# Patient Record
Sex: Male | Born: 1935 | Race: Black or African American | Hispanic: No | Marital: Married | Smoking: Former smoker
Health system: Southern US, Community
[De-identification: ages and names within clinical notes are randomized; demographics above are authoritative.]

## PROBLEM LIST (undated history)

## (undated) DIAGNOSIS — I119 Hypertensive heart disease without heart failure: Secondary | ICD-10-CM

## (undated) DIAGNOSIS — E785 Hyperlipidemia, unspecified: Secondary | ICD-10-CM

## (undated) DIAGNOSIS — I259 Chronic ischemic heart disease, unspecified: Secondary | ICD-10-CM

## (undated) DIAGNOSIS — I2510 Atherosclerotic heart disease of native coronary artery without angina pectoris: Secondary | ICD-10-CM

## (undated) DIAGNOSIS — I214 Non-ST elevation (NSTEMI) myocardial infarction: Secondary | ICD-10-CM

## (undated) HISTORY — DX: Non-ST elevation (NSTEMI) myocardial infarction: I21.4

## (undated) HISTORY — DX: Atherosclerotic heart disease of native coronary artery without angina pectoris: I25.10

## (undated) HISTORY — DX: Hypertensive heart disease without heart failure: I11.9

## (undated) HISTORY — DX: Chronic ischemic heart disease, unspecified: I25.9

## (undated) HISTORY — PX: CORONARY STENT PLACEMENT: SHX1402

## (undated) HISTORY — DX: Hyperlipidemia, unspecified: E78.5

---

## 1998-11-16 ENCOUNTER — Emergency Department (HOSPITAL_COMMUNITY): Admission: EM | Admit: 1998-11-16 | Discharge: 1998-11-16 | Payer: Self-pay | Admitting: Emergency Medicine

## 1998-11-22 ENCOUNTER — Emergency Department (HOSPITAL_COMMUNITY): Admission: EM | Admit: 1998-11-22 | Discharge: 1998-11-22 | Payer: Self-pay | Admitting: Emergency Medicine

## 2000-06-29 ENCOUNTER — Emergency Department (HOSPITAL_COMMUNITY): Admission: EM | Admit: 2000-06-29 | Discharge: 2000-06-29 | Payer: Self-pay | Admitting: Emergency Medicine

## 2001-02-10 ENCOUNTER — Emergency Department (HOSPITAL_COMMUNITY): Admission: EM | Admit: 2001-02-10 | Discharge: 2001-02-11 | Payer: Self-pay

## 2006-06-28 DIAGNOSIS — I2510 Atherosclerotic heart disease of native coronary artery without angina pectoris: Secondary | ICD-10-CM

## 2006-06-28 HISTORY — DX: Atherosclerotic heart disease of native coronary artery without angina pectoris: I25.10

## 2006-12-31 ENCOUNTER — Inpatient Hospital Stay (HOSPITAL_COMMUNITY): Admission: EM | Admit: 2006-12-31 | Discharge: 2007-01-03 | Payer: Self-pay | Admitting: Emergency Medicine

## 2007-01-02 HISTORY — PX: CARDIAC CATHETERIZATION: SHX172

## 2010-06-18 ENCOUNTER — Ambulatory Visit: Payer: Self-pay | Admitting: Cardiology

## 2010-11-10 NOTE — Cardiovascular Report (Signed)
[name], [name] NO.:  [id number]   MEDICAL RECORD NO.:  [id number]          PATIENT TYPE:  INP   LOCATION:  3707                         FACILITY:  [facility]   PHYSICIAN:  Vesta Mixer, M.D. DATE OF BIRTH:  [birth year]   DATE OF PROCEDURE:  01/02/2007  DATE OF DISCHARGE:                            CARDIAC CATHETERIZATION   [name] is a 75 year old gentleman with a history of  hypercholesterolemia.  He has not been to the doctor in many years.  He  presented with chest pain and was found to have a subendocardial  myocardial infarction with positive cardiac enzymes.  He is referred for  heart catheterization for further evaluation.   PROCEDURE:  Left heart catheterization with coronary angiography, PTCA,  and stenting of the left circumflex artery.   The right coronary artery was easily cannulated using a modified  Seldinger technique.   HEMODYNAMICS:  LV pressure is 141/10, with an aortic pressure of 141/70.   ANGIOGRAPHY:  Left main.  The left main is fairly large vessel.  There  is a distal 30-40% stenosis right at the bifurcation of the LAD and left  circumflex artery.   The left anterior descending artery is a moderate-to-large vessel.  There are mild to moderate diffuse irregularities throughout the LAD.  There is a 40% proximal stenosis.  There is diffuse 30%-40% stenosis  throughout the course of the LAD.   The LAD gives off a first diagonal artery that is fairly large.  There  is diffuse 40%-50% stenosis, with a focal 60%-70% stenosis.   The left circumflex artery is a very large vessel.  There is a tight 99%  proximal stenosis.  There is TIMI grade III flow through the vessel.   The right coronary artery is large and dominant.  The RCA is severely  and diffusely diseased, between 75% and 80% throughout the proximal and  midsegments.  The distal segments have moderate irregularities.  The  left ventriculogram was performed in the 30 RAO  position.  It reveals  overall well-preserved left ventricular systolic function, with an  ejection fraction of 50%.   PCI:  The 6-French sheath was traded out for a 7-French sheath.  The  patient received Angiomax.  He also received Plavix 600 mg p.o.Marland Kitchen  The  left main was cannulated using a CLS-4 guide.  A BMW wire was placed  down into the distal circumflex artery.  A 3 x 15-mm Maverick was used  to predilate the lesion.  It was placed across the stenosis and was  inflated up to 10 atmospheres for 48 seconds.  Following this, a 3 x 18  mm Cypher was positioned across the stenosis and was deployed 16  atmospheres for 47 seconds.   Poststent dilatation was achieved using a 3.5 by a 15 mm Dura Star.  It  was positioned in the middle.  The stent was inflated up to 16  atmospheres for 30 seconds.  It was then pulled back proximally and was  inflated up to 16 atmospheres for 18 seconds.  It was then repositioned  one more time.  The proximal aspect of the stent was inflated up to 18  atmospheres for seconds.  This final size was approximately 3.6 mm in  diameter.  This gave [place] a nice angiographic result.  There was some wire  bias in the distal aspect of the circumflex artery which resolved with  removal of the wire.   COMPLICATIONS:  None.   CONCLUSIONS:  1. Severe three-vessel coronary artery disease, with an especially      tight stenosis in the left circumflex artery.  2. Successful PTCA and stenting of the left circumflex artery.  The      patient will continue with medical therapy for his LAD and right      coronary artery.  He will need very aggressive cholesterol      lowering.           ______________________________  Vesta Mixer, M.D.     PJN/MEDQ  D:  01/02/2007  T:  01/02/2007  Job:  [id number]   cc:   Cassell Clement, M.D.

## 2010-11-10 NOTE — Discharge Summary (Signed)
[name], HLAVATY NO.:  [id number]   MEDICAL RECORD NO.:  [id number]          PATIENT TYPE:  INP   LOCATION:  6531                         FACILITY:  [facility]   PHYSICIAN:  Cassell Clement, M.D. DATE OF BIRTH:  22-Apr-1936   DATE OF ADMISSION:  12/31/2006  DATE OF DISCHARGE:  01/03/2007                               DISCHARGE SUMMARY   FINAL DIAGNOSES:  1. Acute subendocardial myocardial infarction with positive enzymes.  2. Hypertension.  3. Hyperlipidemia.   HISTORY:  This 75 year old African American male was admitted through  the emergency room on December 31, 2006, with chest pain.  He noted the chest  pain when he got up to void on the evening prior to admission.  He had  had a previous episode of chest discomfort while mowing the grass a week  earlier.  He came to the emergency room, where his blood pressure was  markedly elevated at 190/115.  Examination was unremarkable and the  patient was in minimal chest discomfort at the time of his examination.  His electrocardiogram showed no acute changes.  Chest x-ray showed  borderline cardiomegaly.  Initial labs showed normal renal function,  slightly elevated blood sugar at 127, BUN of 14, with normal  electrolytes, and his initial cardiac enzymes were elevated with a  troponin of 0.15.  Chest x-ray showed mild cardiomegaly but no acute  pulmonary disease.  The patient was placed on IV nitroglycerin and IV  heparin.  He was admitted over the Fourth of July weekend.  On Monday,  July 7, he underwent cardiac catheterization by Dr. Delane Ginger.  Dr.  Elease Hashimoto found that the patient had significant three-vessel coronary  disease with an especially tight stenosis in the left circumflex artery.  This was treated with successful PTCA and stenting of the left  circumflex artery.  The medical therapy would be continued for his LAD  and right coronary artery.  Of note is the fact that the patient's  lipids on admission were  extremely high with a total cholesterol of 304,  LDL of 251, HDL of 32 and triglycerides of 104.   His cardiac enzymes did show significant elevation with a peak troponin  of 1.85 and peak CK-MB of 15.9.  The patient tolerated the  catheterization well and he was able to be discharged improved the  following morning, to be followed closely in the office.  The importance  of compliance with blood pressure and cholesterol medication long-term  was emphasized to the patient.  The patient will be discharged on a low-  sodium, heart-healthy diet.  He is not to do any heavy lifting or  straining.  He is to be seen back in the office in 1 week.   DISCHARGE MEDICATIONS:  1. Generic Toprol XL 50 mg one daily.  2. Altace 5 mg daily.  3. Lipitor 80 mg daily.  4. Norvasc 5 mg daily.  5. Aspirin 325 mg daily.  6. Plavix 75 mg daily.  7. Nitrostat 1/150 sublingually p.r.n.   When the patient returns in 1 week for office visit, we will get  an EKG  and a BMET.   CONDITION ON DISCHARGE:  Improved.           ______________________________  Cassell Clement, M.D.     TB/MEDQ  D:  01/03/2007  T:  01/03/2007  Job:  [id number]   cc:   Mina Marble, M.D.  Vesta Mixer, M.D.

## 2010-11-10 NOTE — H&P (Signed)
[name], [name] NO.:  [id number]   MEDICAL RECORD NO.:  [id number]          PATIENT TYPE:  EMS   LOCATION:  MAJO                         FACILITY:  [facility]   PHYSICIAN:  Cassell Clement, M.D. DATE OF BIRTH:  [birth year]   DATE OF ADMISSION:  12/31/2006  DATE OF DISCHARGE:                              HISTORY & PHYSICAL   CHIEF COMPLAINT:  Chest pain.   HISTORY:  This is a 75 year old African American male admitted with  substernal chest pain with right arm tingling.  He had the onset last  evening when he got up to void.  The discomfort lasted 10 minutes.  There was no nausea, vomiting or diaphoresis.  Presently the patient is  on no medication.  He last saw his primary MD about a year ago and was  told to take a blood pressure pill and Lipitor but when he ran out of  his medicines, he did not bother to get it refilled because he was  feeling well.  He does not take aspirin on a regular basis but does take  B.C.'s frequently.   The family history reveals that his mother is still living at age 20 but  has a heart condition.  Father died in his 59s.   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  He quit smoking 30 years ago,  quit drinking 30 years ago.   He has had no operations except for a laceration of the head.   REVIEW OF SYSTEMS:  Unremarkable.  He does not have any history of  peptic ulcer disease or any history of genitourinary problems.  He  denies any respiratory symptoms.   Remainder of the review of systems is negative in detail.   On physical exam, blood pressure is 190/115, pulse is 80 and regular,  temperature 97.4.  This is a well-developed, well-nourished gentleman in no distress.  HEENT:  No carotid bruits.  Chest is clear.  The heart reveals a quiet precordium without murmur, gallop, rub or  click.  The abdomen is soft without hepatosplenomegaly or masses.  Extremities  show trace ankle edema.  There are 1+ pedal pulses.   His electrocardiogram shows poor R-wave progression in V1 through V3 but  no acute changes.  Chest x-ray shows borderline cardiomegaly, no active  disease.  Initial labs showed normal renal function, slightly elevated  blood sugar at 127, BUN 14, creatinine 1.0, sodium 138, potassium 4.1.  White count 5500, hemoglobin of 14.1.  Troponin I is was 0.15, CK-MB is  3.5.  INR is 0.9, PTT 28.  Chest x-ray shows suboptimal inspiration with  mild cardiomegaly but no acute pulmonary disease.   IMPRESSION:  1. Acute coronary syndrome, rule out myocardial infarction.  I should      mention also that a week ago the patient noted a similar episode of      chest tightness while mowing the grass and it was relieved by rest.  2. Hypertensive cardiovascular disease.  3. Hyperlipidemia.   DISPOSITION:  Admit to telemetry  for treating him with aspirin, statins,  beta-blockers, ACE inhibitors, we will start him on IV nitroglycerin, IV  heparin.  Will get serial enzymes and EKGs.  Anticipate cardiac  catheterization on July 7 by [facility] or sooner p.r.n.           ______________________________  Cassell Clement, M.D.     TB/MEDQ  D:  12/31/2006  T:  12/31/2006  Job:  [id number]   cc:   Mina Marble, M.D.

## 2010-12-24 ENCOUNTER — Other Ambulatory Visit: Payer: Self-pay | Admitting: Cardiology

## 2010-12-24 DIAGNOSIS — E7800 Pure hypercholesterolemia, unspecified: Secondary | ICD-10-CM

## 2010-12-24 NOTE — Telephone Encounter (Signed)
Faxed refill for pravastatin 

## 2011-02-03 ENCOUNTER — Encounter: Payer: Self-pay | Admitting: Cardiology

## 2011-02-10 ENCOUNTER — Other Ambulatory Visit: Payer: Self-pay | Admitting: Cardiology

## 2011-02-10 DIAGNOSIS — I2510 Atherosclerotic heart disease of native coronary artery without angina pectoris: Secondary | ICD-10-CM

## 2011-02-10 DIAGNOSIS — I252 Old myocardial infarction: Secondary | ICD-10-CM

## 2011-02-10 DIAGNOSIS — I119 Hypertensive heart disease without heart failure: Secondary | ICD-10-CM

## 2011-02-10 DIAGNOSIS — I259 Chronic ischemic heart disease, unspecified: Secondary | ICD-10-CM

## 2011-02-11 ENCOUNTER — Other Ambulatory Visit: Payer: Self-pay | Admitting: *Deleted

## 2011-02-12 ENCOUNTER — Other Ambulatory Visit: Payer: Self-pay | Admitting: Cardiology

## 2011-02-12 ENCOUNTER — Other Ambulatory Visit (INDEPENDENT_AMBULATORY_CARE_PROVIDER_SITE_OTHER): Payer: Medicare Other | Admitting: *Deleted

## 2011-02-12 DIAGNOSIS — I252 Old myocardial infarction: Secondary | ICD-10-CM

## 2011-02-12 DIAGNOSIS — I259 Chronic ischemic heart disease, unspecified: Secondary | ICD-10-CM

## 2011-02-12 DIAGNOSIS — I2510 Atherosclerotic heart disease of native coronary artery without angina pectoris: Secondary | ICD-10-CM

## 2011-02-12 DIAGNOSIS — I119 Hypertensive heart disease without heart failure: Secondary | ICD-10-CM

## 2011-02-12 LAB — BASIC METABOLIC PANEL
CO2: 27 mEq/L (ref 19–32)
Calcium: 9.3 mg/dL (ref 8.4–10.5)
Creatinine, Ser: 1.3 mg/dL (ref 0.4–1.5)
GFR: 72.13 mL/min (ref >60.00)

## 2011-02-12 LAB — HEPATIC FUNCTION PANEL
Bilirubin, Direct: 0 mg/dL (ref 0.0–0.3)
Total Protein: 7.4 g/dL (ref 6.0–8.3)

## 2011-02-12 LAB — LIPID PANEL
Cholesterol: 233 mg/dL — ABNORMAL HIGH (ref 0–200)
HDL: 43.8 mg/dL (ref >39.00)
Total CHOL/HDL Ratio: 5
Triglycerides: 183 mg/dL — ABNORMAL HIGH (ref 0.0–149.0)
VLDL: 36.6 mg/dL (ref 0.0–40.0)

## 2011-02-15 ENCOUNTER — Ambulatory Visit (INDEPENDENT_AMBULATORY_CARE_PROVIDER_SITE_OTHER): Payer: Medicare Other | Admitting: Cardiology

## 2011-02-15 ENCOUNTER — Encounter: Payer: Self-pay | Admitting: Cardiology

## 2011-02-15 DIAGNOSIS — E7800 Pure hypercholesterolemia, unspecified: Secondary | ICD-10-CM

## 2011-02-15 DIAGNOSIS — E119 Type 2 diabetes mellitus without complications: Secondary | ICD-10-CM

## 2011-02-15 DIAGNOSIS — E785 Hyperlipidemia, unspecified: Secondary | ICD-10-CM | POA: Insufficient documentation

## 2011-02-15 DIAGNOSIS — I259 Chronic ischemic heart disease, unspecified: Secondary | ICD-10-CM

## 2011-02-15 DIAGNOSIS — I2510 Atherosclerotic heart disease of native coronary artery without angina pectoris: Secondary | ICD-10-CM

## 2011-02-15 DIAGNOSIS — I119 Hypertensive heart disease without heart failure: Secondary | ICD-10-CM

## 2011-02-15 MED ORDER — METFORMIN HCL 500 MG PO TABS: 500.0000 mg | ORAL_TABLET | Freq: Every day | ORAL | Status: DC

## 2011-02-15 NOTE — Assessment & Plan Note (Signed)
The patient has a history of hypercholesterolemia.  He is on  pravastatin 40 mg daily.He is not having any side effects from the statin therapy

## 2011-02-15 NOTE — Assessment & Plan Note (Signed)
Recent blood sugars have been running elevated.Blood sugar today as 130.  Triglycerides run high.  He is gaining weight.  We are going to add metformin 500 mg one daily to his regimen

## 2011-02-15 NOTE — Progress Notes (Signed)
[name]:  August 31, 1935 [facility] [address]   [address] [place], [place]  [address] [phone]           Fax   [phone]  History of Present Illness: This pleasant 75 year old [place] American male is seen for a scheduled followup office visit he has a history of ischemic heart disease.  He had a history of a subendocardial myocardial infarction with positive enzymes in July 2008.  At that time he had stenting of his left circumflex.  He has not had any recurrent chest pain.  He does have a history of hypercholesterolemia and essential hypertension and recently has shown evidence of diabetes.  He has gained weight since last visit and has been eating some sweets.  He has not had any recurrent angina pectoris  Current Outpatient Prescriptions  Medication Sig Dispense Refill  . aspirin 81 MG tablet Take 81 mg by mouth daily.        . clopidogrel (PLAVIX) 75 MG tablet Take 75 mg by mouth daily.        . metoprolol (TOPROL-XL) 50 MG 24 hr tablet Take 50 mg by mouth daily.        . nitroGLYCERIN (NITROSTAT) 0.4 MG SL tablet Place 0.4 mg under the tongue every 5 (five) minutes as needed.        . pravastatin (PRAVACHOL) 40 MG tablet TAKE ONE TABLET BY MOUTH EVERY DAY  30 tablet  3  . lisinopril-hydrochlorothiazide (PRINZIDE,ZESTORETIC) 20-12.5 MG per tablet Take 1 tablet by mouth daily.        . metFORMIN (GLUCOPHAGE) 500 MG tablet Take 1 tablet (500 mg total) by mouth daily with breakfast.  30 tablet  11  . niacin 500 MG tablet Take 500 mg by mouth daily with breakfast.          No Known Allergies  There is no problem list on file for this patient.   History  Smoking status  . Former Smoker  . Quit date: 02/02/1985  Smokeless tobacco  . Not on file    History  Alcohol Use No    No family history on file.  Review of Systems: Constitutional: no fever chills diaphoresis or fatigue or change in weight.  Head and neck: no  hearing loss, no epistaxis, no photophobia or visual disturbance. Respiratory: No cough, shortness of breath or wheezing. Cardiovascular: No chest pain peripheral edema, palpitations. Gastrointestinal: No abdominal distention, no abdominal pain, no change in bowel habits hematochezia or melena. Genitourinary: No dysuria, no frequency, no urgency, no nocturia. Musculoskeletal:No arthralgias, no back pain, no gait disturbance or myalgias. Neurological: No dizziness, no headaches, no numbness, no seizures, no syncope, no weakness, no tremors. Hematologic: No lymphadenopathy, no easy bruising. Psychiatric: No confusion, no hallucinations, no sleep disturbance.    Physical Exam: Filed Vitals:   02/15/11 1412  BP: 130/80  Pulse: 80   The general appearance reveals a well-developed well-nourished gentleman in no distress.Pupils equal and reactive.   Extraocular Movements are full.  There is no scleral icterus.  The mouth and pharynx are normal.  The neck is supple.  The carotids reveal no bruits.  The jugular venous pressure is normal.  The thyroid is not enlarged.  There is no lymphadenopathy.  The chest is clear to percussion and auscultation. There are no rales or rhonchi. Expansion of the chest is symmetrical.  The precordium is quiet.  The first heart sound is normal.  The second  heart sound is physiologically split.  There is no murmur gallop rub or click.  There is no abnormal lift or heave.  The abdomen is soft and nontender. Bowel sounds are normal. The liver and spleen are not enlarged. There Are no abdominal masses. There are no bruits.    Extremities show mild peripheral edema.Strength is normal and symmetrical in all extremities.  There is no lateralizing weakness.  There are no sensory deficits.  The skin is warm and dry.  There is no rash.    Assessment / Plan: Continue same medication.  Recheck in 6 months for followup office visit and lab work.

## 2011-02-15 NOTE — Assessment & Plan Note (Addendum)
No history of any recent angina pectoris.  No palpitations.  No symptoms of CHF.He does have a history of a Cypher drug-eluting stent to the left circumflex in 01/02/07 and he remains on Plavix And a baby aspirin daily.

## 2011-04-13 LAB — DIFFERENTIAL
Basophils Absolute: 0
Eosinophils Relative: 2
Lymphocytes Relative: 28
Monocytes Absolute: 0.5
Monocytes Relative: 9

## 2011-04-13 LAB — CBC
HCT: 39.7
HCT: 43.3
Hemoglobin: 13
Hemoglobin: 14.1
MCHC: 32.7
MCHC: 32.9
MCV: 81.7
MCV: 82.3
Platelets: 309
RBC: 4.77
RBC: 4.82
RBC: 5.29
RDW: 14.4 — ABNORMAL HIGH
WBC: 6.4
WBC: 7.8

## 2011-04-13 LAB — BASIC METABOLIC PANEL
BUN: 10
BUN: 11
Calcium: 9.2
Calcium: 9.4
Chloride: 102
Chloride: 102
Creatinine, Ser: 0.95
Creatinine, Ser: 0.97
Creatinine, Ser: 1
GFR calc Af Amer: >60
GFR calc Af Amer: >60
GFR calc Af Amer: >60
GFR calc non Af Amer: >60
GFR calc non Af Amer: >60
Potassium: 3.9
Sodium: 138

## 2011-04-13 LAB — POCT I-STAT CREATININE: Operator id: 196461

## 2011-04-13 LAB — POCT CARDIAC MARKERS
CKMB, poc: 3.5
CKMB, poc: 6.5
Myoglobin, poc: 122
Myoglobin, poc: 125
Myoglobin, poc: 188
Operator id: 196461
Operator id: 196461
Operator id: 291361
Troponin i, poc: 0.15 — ABNORMAL HIGH
Troponin i, poc: 0.23 — ABNORMAL HIGH

## 2011-04-13 LAB — CARDIAC PANEL(CRET KIN+CKTOT+MB+TROPI)
CK, MB: 15.9 — ABNORMAL HIGH
Relative Index: 5.1 — ABNORMAL HIGH
Total CK: 215
Troponin I: 1.85

## 2011-04-13 LAB — I-STAT 8, (EC8 V) (CONVERTED LAB)
BUN: 14
Bicarbonate: 21.8
Glucose, Bld: 127 — ABNORMAL HIGH
TCO2: 23
pH, Ven: 7.447 — ABNORMAL HIGH

## 2011-04-13 LAB — HEPARIN LEVEL (UNFRACTIONATED)
Heparin Unfractionated: 0.55
Heparin Unfractionated: 0.74 — ABNORMAL HIGH

## 2011-04-13 LAB — PROTIME-INR: Prothrombin Time: 12.6

## 2011-04-13 LAB — HEMOGLOBIN A1C: Hgb A1c MFr Bld: 7 — ABNORMAL HIGH

## 2011-04-13 LAB — D-DIMER, QUANTITATIVE: D-Dimer, Quant: 1.27 — ABNORMAL HIGH

## 2011-04-13 LAB — CK TOTAL AND CKMB (NOT AT ARMC): Total CK: 160

## 2011-04-13 LAB — LIPID PANEL
HDL: 32 — ABNORMAL LOW
Total CHOL/HDL Ratio: 9.5
Triglycerides: 104

## 2011-04-13 LAB — TROPONIN I: Troponin I: 0.38 — ABNORMAL HIGH

## 2011-05-06 ENCOUNTER — Other Ambulatory Visit: Payer: Medicare Other

## 2011-05-08 ENCOUNTER — Other Ambulatory Visit: Payer: Self-pay | Admitting: Cardiology

## 2011-06-02 ENCOUNTER — Ambulatory Visit: Payer: Medicare Other | Admitting: Infectious Diseases

## 2011-08-18 ENCOUNTER — Other Ambulatory Visit: Payer: Medicare Other

## 2011-08-18 ENCOUNTER — Encounter: Payer: Self-pay | Admitting: Cardiology

## 2011-08-18 ENCOUNTER — Ambulatory Visit (INDEPENDENT_AMBULATORY_CARE_PROVIDER_SITE_OTHER): Payer: Medicare Other | Admitting: Cardiology

## 2011-08-18 VITALS — BP 140/98 | HR 64 | Ht 70.0 in | Wt 226.0 lb

## 2011-08-18 DIAGNOSIS — E7800 Pure hypercholesterolemia, unspecified: Secondary | ICD-10-CM

## 2011-08-18 DIAGNOSIS — I259 Chronic ischemic heart disease, unspecified: Secondary | ICD-10-CM

## 2011-08-18 DIAGNOSIS — E119 Type 2 diabetes mellitus without complications: Secondary | ICD-10-CM

## 2011-08-18 DIAGNOSIS — I2510 Atherosclerotic heart disease of native coronary artery without angina pectoris: Secondary | ICD-10-CM

## 2011-08-18 DIAGNOSIS — I119 Hypertensive heart disease without heart failure: Secondary | ICD-10-CM

## 2011-08-18 LAB — BASIC METABOLIC PANEL
Calcium: 9.3 mg/dL (ref 8.4–10.5)
GFR: 92.97 mL/min (ref >60.00)
Glucose, Bld: 110 mg/dL — ABNORMAL HIGH (ref 70–99)
Potassium: 3.9 mEq/L (ref 3.5–5.1)
Sodium: 139 mEq/L (ref 135–145)

## 2011-08-18 LAB — LIPID PANEL
Cholesterol: 242 mg/dL — ABNORMAL HIGH (ref 0–200)
HDL: 43.9 mg/dL (ref >39.00)
Triglycerides: 111 mg/dL (ref 0.0–149.0)

## 2011-08-18 LAB — HEPATIC FUNCTION PANEL
ALT: 12 U/L (ref 0–53)
AST: 12 U/L (ref 0–37)
Albumin: 3.9 g/dL (ref 3.5–5.2)
Total Protein: 7.4 g/dL (ref 6.0–8.3)

## 2011-08-18 LAB — HEMOGLOBIN A1C: Hgb A1c MFr Bld: 7.1 % — ABNORMAL HIGH (ref 4.6–6.5)

## 2011-08-18 MED ORDER — NITROGLYCERIN 0.4 MG SL SUBL: 0.4000 mg | SUBLINGUAL_TABLET | SUBLINGUAL | Status: DC | PRN

## 2011-08-18 MED ORDER — LISINOPRIL-HYDROCHLOROTHIAZIDE 20-12.5 MG PO TABS: 1.0000 | ORAL_TABLET | Freq: Every day | ORAL | Status: DC

## 2011-08-18 NOTE — Patient Instructions (Addendum)
Will obtain labs today and call you with the results (lp/bmet/hfp/a1c)  Restart Lisinopril Hct 20/12.5mg  daily, Rx sent to [facility] for this and NTG  Your physician wants you to follow-up in: 6 months You will receive a reminder letter in the mail two months in advance. If you don't receive a letter, please call our office to schedule the follow-up appointment.

## 2011-08-18 NOTE — Assessment & Plan Note (Signed)
The patient has not been expressing any chest pain or angina. 

## 2011-08-18 NOTE — Assessment & Plan Note (Signed)
The patient is on pravastatin 40 mg daily for his elevated cholesterol.  He has not been having any myalgias.

## 2011-08-18 NOTE — Assessment & Plan Note (Signed)
The patient is on metformin for his diabetes.  He is not having any hypoglycemic episodes.  We are checking a hemoglobin A1c today.

## 2011-08-18 NOTE — Progress Notes (Signed)
[name] Date of Birth:  19-Jul-1935 [facility] [address] [place], [place]  [address] [phone]         Fax   [phone]  History of Present Illness: This pleasant 76 year old African American gentleman is seen for a scheduled 6 month followup office visit.  As a history of known ischemic heart disease.  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  Since then he has not had any recurrent chest pain.  Does carry sublingual nitroglycerin if he were to have pain.  Patient has a history of hypercholesterolemia high blood pressure and diabetes.  His last visit he has lost 5 pounds he is trileaflet suite he has run out of his lisinopril HCT.  Current Outpatient Prescriptions  Medication Sig Dispense Refill  . aspirin 81 MG tablet Take 81 mg by mouth daily.        . clopidogrel (PLAVIX) 75 MG tablet Take 75 mg by mouth daily.        . metFORMIN (GLUCOPHAGE) 500 MG tablet Take 1 tablet (500 mg total) by mouth daily with breakfast.  30 tablet  11  . metoprolol (TOPROL-XL) 50 MG 24 hr tablet Take 50 mg by mouth daily.        . nitroGLYCERIN (NITROSTAT) 0.4 MG SL tablet Place 1 tablet (0.4 mg total) under the tongue every 5 (five) minutes as needed.  25 tablet  prn  . pravastatin (PRAVACHOL) 40 MG tablet TAKE ONE TABLET BY MOUTH EVERY DAY  30 tablet  6  . lisinopril-hydrochlorothiazide (PRINZIDE,ZESTORETIC) 20-12.5 MG per tablet Take 1 tablet by mouth daily.  30 tablet  11    No Known Allergies  Patient Active Problem List  Diagnoses  . Ischemic heart disease  . Hypercholesterolemia  . Diabetes mellitus    History  Smoking status  . Former Smoker  . Quit date: 02/02/1985  Smokeless tobacco  . Not on file    History  Alcohol Use No    No family history on file.  Review of Systems: Constitutional: no fever chills diaphoresis or fatigue  or change in weight.  Head and neck: no hearing loss, no epistaxis, no photophobia or visual disturbance. Respiratory: No cough, shortness of breath or wheezing. Cardiovascular: No chest pain peripheral edema, palpitations. Gastrointestinal: No abdominal distention, no abdominal pain, no change in bowel habits hematochezia or melena. Genitourinary: No dysuria, no frequency, no urgency, no nocturia. Musculoskeletal:No arthralgias, no back pain, no gait disturbance or myalgias. Neurological: No dizziness, no headaches, no numbness, no seizures, no syncope, no weakness, no tremors. Hematologic: No lymphadenopathy, no easy bruising. Psychiatric: No confusion, no hallucinations, no sleep disturbance.    Physical Exam: Filed Vitals:   08/18/11 1031  BP: 140/98  Pulse: 64   the general appearance reveals a well-developed African American gentleman in no distress.Pupils equal and reactive.   Extraocular Movements are full.  There is no scleral icterus.  The mouth and pharynx are normal.  The neck is supple.  The carotids reveal no bruits.  The jugular venous pressure is normal.  The thyroid is not enlarged.  There is no lymphadenopathy.  The chest is clear to percussion and auscultation. There are no rales or rhonchi. Expansion of the chest is symmetrical.  The precordium is quiet.  The first heart sound is normal.  The second heart sound is physiologically split.  There is no  murmur gallop rub or click.  There is no abnormal lift or heave.  The abdomen is soft and nontender. Bowel sounds are normal. The liver and spleen are not enlarged. There Are no abdominal masses. There are no bruits.  Extremities reveal no phlebitis or edema.  Pedal pulses are good.Strength is normal and symmetrical in all extremities.  There is no lateralizing weakness.  There are no sensory deficits.     Assessment / Plan: The patient has run out of his lisinopril HCT which would account for his higher blood pressure  today.  We will restart lisinopril HCT 20/12.5 one daily.  We will also refill his Nitrostat 0.4 mg sublingually when necessary which he has run out of also.  Recheck in 6 months for followup office visit EKG lipid panel hepatic function panel nasal metabolic panel and hemoglobin A5W.  Continue diabetic diet.

## 2011-08-23 ENCOUNTER — Telehealth: Payer: Self-pay | Admitting: *Deleted

## 2011-08-23 NOTE — Telephone Encounter (Signed)
Mailed copy of labs and left message to call if any questions  

## 2011-08-23 NOTE — Telephone Encounter (Signed)
Message copied by Burnell Blanks on Mon Aug 23, 2011  1:04 PM ------      Message from: Cassell Clement      Created: Wed Aug 18, 2011  9:05 PM       Please report.  Labs okay except cholesterol still too high and diabetes needs better control.  Lose more weight, careful diet.  Continue same meds.

## 2011-09-02 ENCOUNTER — Other Ambulatory Visit: Payer: Self-pay | Admitting: Cardiology

## 2011-09-02 NOTE — Telephone Encounter (Signed)
Refilled metoprolol 

## 2011-09-11 ENCOUNTER — Other Ambulatory Visit: Payer: Self-pay | Admitting: Cardiology

## 2011-09-13 NOTE — Telephone Encounter (Signed)
Refilled plavix 

## 2011-12-15 ENCOUNTER — Other Ambulatory Visit: Payer: Self-pay | Admitting: Cardiology

## 2012-02-27 ENCOUNTER — Other Ambulatory Visit: Payer: Self-pay | Admitting: Cardiology

## 2012-02-29 ENCOUNTER — Other Ambulatory Visit: Payer: Self-pay | Admitting: *Deleted

## 2012-02-29 MED ORDER — METFORMIN HCL 500 MG PO TABS: 500.0000 mg | ORAL_TABLET | Freq: Every day | ORAL | Status: DC

## 2012-02-29 NOTE — Telephone Encounter (Signed)
Patient has appointment in November

## 2012-03-14 ENCOUNTER — Other Ambulatory Visit: Payer: Self-pay | Admitting: Cardiology

## 2012-05-03 ENCOUNTER — Encounter: Payer: Self-pay | Admitting: Cardiology

## 2012-05-03 ENCOUNTER — Ambulatory Visit (INDEPENDENT_AMBULATORY_CARE_PROVIDER_SITE_OTHER): Payer: Medicare Other | Admitting: Cardiology

## 2012-05-03 ENCOUNTER — Telehealth: Payer: Self-pay | Admitting: *Deleted

## 2012-05-03 VITALS — BP 142/74 | HR 56 | Ht 70.0 in | Wt 212.0 lb

## 2012-05-03 DIAGNOSIS — I119 Hypertensive heart disease without heart failure: Secondary | ICD-10-CM

## 2012-05-03 DIAGNOSIS — E7800 Pure hypercholesterolemia, unspecified: Secondary | ICD-10-CM

## 2012-05-03 DIAGNOSIS — I259 Chronic ischemic heart disease, unspecified: Secondary | ICD-10-CM

## 2012-05-03 DIAGNOSIS — E119 Type 2 diabetes mellitus without complications: Secondary | ICD-10-CM

## 2012-05-03 LAB — BASIC METABOLIC PANEL
Chloride: 103 mEq/L (ref 96–112)
GFR: 85.89 mL/min (ref >60.00)
Glucose, Bld: 113 mg/dL — ABNORMAL HIGH (ref 70–99)
Potassium: 3.7 mEq/L (ref 3.5–5.1)
Sodium: 139 mEq/L (ref 135–145)

## 2012-05-03 LAB — HEPATIC FUNCTION PANEL
ALT: 9 U/L (ref 0–53)
AST: 11 U/L (ref 0–37)
Alkaline Phosphatase: 47 U/L (ref 39–117)
Total Bilirubin: 0.9 mg/dL (ref 0.3–1.2)

## 2012-05-03 LAB — LIPID PANEL
Total CHOL/HDL Ratio: 7
VLDL: 24.8 mg/dL (ref 0.0–40.0)

## 2012-05-03 NOTE — Progress Notes (Signed)
[name] Date of Birth:  [birth year] [facility] [address] [place], [place]  [address] [phone]         Fax   [phone]  History of Present Illness: This pleasant 76 year old African American gentleman is seen for a scheduled 6 month followup office visit.  The patient has a history of known ischemic heart disease. 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 Since then he has not had any recurrent chest pain. Does carry sublingual nitroglycerin if he were to have pain. Patient has a history of hypercholesterolemia high blood pressure and diabetes.  The patient is on a careful diet and has lost 14 pounds since last visit intentionally.   Current Outpatient Prescriptions  Medication Sig Dispense Refill  . aspirin 81 MG tablet Take 81 mg by mouth daily.        Marland Kitchen lisinopril-hydrochlorothiazide (PRINZIDE,ZESTORETIC) 20-12.5 MG per tablet Take 1 tablet by mouth daily.  30 tablet  11  . metFORMIN (GLUCOPHAGE) 500 MG tablet Take 1 tablet (500 mg total) by mouth daily with breakfast.  30 tablet  3  . metoprolol succinate (TOPROL-XL) 50 MG 24 hr tablet TAKE ONE TABLET BY MOUTH EVERY DAY  30 tablet  11  . PLAVIX 75 MG tablet TAKE ONE TABLET BY MOUTH EVERY DAY  30 each  11  . pravastatin (PRAVACHOL) 40 MG tablet TAKE ONE TABLET BY MOUTH EVERY DAY  30 tablet  9  . nitroGLYCERIN (NITROSTAT) 0.4 MG SL tablet Place 1 tablet (0.4 mg total) under the tongue every 5 (five) minutes as needed.  25 tablet  prn    No Known Allergies  Patient Active Problem List  Diagnosis  . Ischemic heart disease  . Hypercholesterolemia  . Diabetes mellitus    History  Smoking status  . Former Smoker  . Quit date: 02/02/1985  Smokeless tobacco  . Not on file    History  Alcohol Use No    No family history on file.  Review of Systems: Constitutional: no fever  chills diaphoresis or fatigue or change in weight.  Head and neck: no hearing loss, no epistaxis, no photophobia or visual disturbance. Respiratory: No cough, shortness of breath or wheezing. Cardiovascular: No chest pain peripheral edema, palpitations. Gastrointestinal: No abdominal distention, no abdominal pain, no change in bowel habits hematochezia or melena. Genitourinary: No dysuria, no frequency, no urgency, no nocturia. Musculoskeletal:No arthralgias, no back pain, no gait disturbance or myalgias. Neurological: No dizziness, no headaches, no numbness, no seizures, no syncope, no weakness, no tremors. Hematologic: No lymphadenopathy, no easy bruising. Psychiatric: No confusion, no hallucinations, no sleep disturbance.    Physical Exam: Filed Vitals:   05/03/12 0942  BP: 142/74  Pulse: 56   the general appearance reveals a well-developed well-nourished gentleman in no distress.The head and neck exam reveals pupils equal and reactive.  Extraocular movements are full.  There is no scleral icterus.  The mouth and pharynx are normal.  The neck is supple.  The carotids reveal no bruits.  The jugular venous pressure is normal.  The  thyroid is not enlarged.  There is no lymphadenopathy.  The chest is clear to percussion and auscultation.  There are no rales or rhonchi.  Expansion of the chest is symmetrical.  The precordium is quiet.  The first heart sound is normal.  The second heart sound is physiologically split.  There  is no murmur gallop rub or click.  There is no abnormal lift or heave.  The abdomen is soft and nontender.  The bowel sounds are normal.  The liver and spleen are not enlarged.  There are no abdominal masses.  There are no abdominal bruits.  Extremities reveal good pedal pulses.  There is no phlebitis or edema.  There is no cyanosis or clubbing.  Strength is normal and symmetrical in all extremities.  There is no lateralizing weakness.  There are no sensory deficits.  The skin is  warm and dry.  There is no rash.  EKG today shows his bradycardia and a pattern of an old anteroseptal myocardial infarction and no ischemic changes.   Assessment / Plan: The patient is to continue same medications.  I rechecked in 6 months for followup office visit lipid panel hepatic function panel basal metabolic panel and A1c.  Continue heart healthy diet.  He is looking forward to celebrating his 50th wedding anniversary next summer.

## 2012-05-03 NOTE — Patient Instructions (Addendum)
Your physician recommends that you continue on your current medications as directed. Please refer to the Current Medication list given to you today.  Your physician wants you to follow-up in: 6 months with fasting labs (lp/bmet/hfp/a1c) You will receive a reminder letter in the mail two months in advance. If you don't receive a letter, please call our office to schedule the follow-up appointment.   Will obtain labs today and call you with the results (lp/bmet/hfp/a1c)

## 2012-05-03 NOTE — Assessment & Plan Note (Signed)
The patient is tolerating pravastatin.  He is not having any myalgias.  Blood work is pending today

## 2012-05-03 NOTE — Assessment & Plan Note (Signed)
The patient denies any recurrent chest pain or angina.  He continues to carry sublingual nitroglycerin with him.  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

## 2012-05-03 NOTE — Progress Notes (Signed)
Quick Note:  Please report to patient. The recent labs are stable. Continue same medication and careful diet. ______ 

## 2012-05-03 NOTE — Assessment & Plan Note (Signed)
The patient has not been experiencing any hypoglycemic episodes. 

## 2012-05-03 NOTE — Telephone Encounter (Signed)
Advised to continue same dose of medications and work harder on diet

## 2012-05-03 NOTE — Telephone Encounter (Signed)
Message copied by Burnell Blanks on Wed May 03, 2012  4:51 PM ------      Message from: Cassell Clement      Created: Wed May 03, 2012  4:26 PM       Please report to patient.  The recent labs are stable. Continue same medication and careful diet.

## 2012-07-10 ENCOUNTER — Telehealth: Payer: Self-pay | Admitting: Cardiology

## 2012-07-10 NOTE — Telephone Encounter (Signed)
Pt needs refill

## 2012-08-28 ENCOUNTER — Other Ambulatory Visit: Payer: Self-pay | Admitting: *Deleted

## 2012-08-28 MED ORDER — LISINOPRIL-HYDROCHLOROTHIAZIDE 20-12.5 MG PO TABS: 1.0000 | ORAL_TABLET | Freq: Every day | ORAL | Status: DC

## 2012-09-27 ENCOUNTER — Telehealth: Payer: Self-pay | Admitting: Cardiology

## 2012-09-27 MED ORDER — METOPROLOL SUCCINATE ER 50 MG PO TB24: 50.0000 mg | ORAL_TABLET | Freq: Every day | ORAL | Status: DC

## 2012-09-27 MED ORDER — CLOPIDOGREL BISULFATE 75 MG PO TABS: 75.0000 mg | ORAL_TABLET | Freq: Every day | ORAL | Status: DC

## 2012-09-27 NOTE — Telephone Encounter (Signed)
Refilled as requested, advised patient

## 2012-09-27 NOTE — Telephone Encounter (Signed)
New problem   Pt stated he can't get medication from pharmacy until they hear from office for Plavix and Metorpolol please call [facility]

## 2012-10-13 ENCOUNTER — Other Ambulatory Visit: Payer: Self-pay | Admitting: *Deleted

## 2012-10-13 MED ORDER — METFORMIN HCL 500 MG PO TABS: 500.0000 mg | ORAL_TABLET | Freq: Every day | ORAL | Status: DC

## 2012-11-01 ENCOUNTER — Ambulatory Visit (INDEPENDENT_AMBULATORY_CARE_PROVIDER_SITE_OTHER): Payer: Medicare Other | Admitting: Cardiology

## 2012-11-01 ENCOUNTER — Encounter: Payer: Self-pay | Admitting: Cardiology

## 2012-11-01 VITALS — BP 144/70 | HR 66 | Ht <= 58 in | Wt 211.4 lb

## 2012-11-01 DIAGNOSIS — I259 Chronic ischemic heart disease, unspecified: Secondary | ICD-10-CM

## 2012-11-01 DIAGNOSIS — E7800 Pure hypercholesterolemia, unspecified: Secondary | ICD-10-CM

## 2012-11-01 LAB — LIPID PANEL
Cholesterol: 254 mg/dL — ABNORMAL HIGH (ref 0–200)
Total CHOL/HDL Ratio: 7
Triglycerides: 257 mg/dL — ABNORMAL HIGH (ref 0.0–149.0)
VLDL: 51.4 mg/dL — ABNORMAL HIGH (ref 0.0–40.0)

## 2012-11-01 LAB — BASIC METABOLIC PANEL
CO2: 26 mEq/L (ref 19–32)
Calcium: 9.6 mg/dL (ref 8.4–10.5)
Creatinine, Ser: 1.1 mg/dL (ref 0.4–1.5)
Sodium: 139 mEq/L (ref 135–145)

## 2012-11-01 LAB — HEPATIC FUNCTION PANEL
AST: 8 U/L (ref 0–37)
Albumin: 3.9 g/dL (ref 3.5–5.2)
Total Protein: 7.5 g/dL (ref 6.0–8.3)

## 2012-11-01 LAB — HEMOGLOBIN A1C: Hgb A1c MFr Bld: 6.9 % — ABNORMAL HIGH (ref 4.6–6.5)

## 2012-11-01 NOTE — Assessment & Plan Note (Signed)
The patient is on metformin for his diabetes.  He denies hypoglycemic episodes.

## 2012-11-01 NOTE — Patient Instructions (Addendum)
Will obtain labs today and call you with the results (lp/bmet/hfp/a1c)  Your physician recommends that you continue on your current medications as directed. Please refer to the Current Medication list given to you today.  Your physician wants you to follow-up in 6 months with fasting labs (lp/bmet/hfp/a1c) and ekg  You will receive a reminder letter in the mail two months in advance. If you don't receive a letter, please call our office to schedule the follow-up appointment.

## 2012-11-01 NOTE — Progress Notes (Signed)
[name] Date of Birth:  07-06-1935 [facility] [address] [place], [place]  [address] [phone]         Fax   [phone]  History of Present Illness: This [name] is seen for a scheduled 6 month followup office visit. The patient has a history of known ischemic heart disease. 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 Since then he has not had any recurrent chest pain. Does carry sublingual nitroglycerin if he were to have pain. Patient has a history of hypercholesterolemia high blood pressure and diabetes. The patient is on a careful diet and has been losing weight intentionally.   Current Outpatient Prescriptions  Medication Sig Dispense Refill  . aspirin 81 MG tablet Take 81 mg by mouth daily.        . clopidogrel (PLAVIX) 75 MG tablet Take 1 tablet (75 mg total) by mouth daily.  30 tablet  5  . lisinopril-hydrochlorothiazide (PRINZIDE,ZESTORETIC) 20-12.5 MG per tablet Take 1 tablet by mouth daily.  30 tablet  3  . metFORMIN (GLUCOPHAGE) 500 MG tablet Take 1 tablet (500 mg total) by mouth daily with breakfast.  30 tablet  1  . metoprolol succinate (TOPROL-XL) 50 MG 24 hr tablet Take 1 tablet (50 mg total) by mouth daily. Take with or immediately following a meal.  30 tablet  5  . nitroGLYCERIN (NITROSTAT) 0.4 MG SL tablet Place 1 tablet (0.4 mg total) under the tongue every 5 (five) minutes as needed.  25 tablet  prn  . pravastatin (PRAVACHOL) 40 MG tablet TAKE ONE TABLET BY MOUTH EVERY DAY  30 tablet  9   No current facility-administered medications for this visit.    No Known Allergies  Patient Active Problem List   Diagnosis Date Noted  . Ischemic heart disease 02/15/2011  . Hypercholesterolemia 02/15/2011  . Diabetes mellitus 02/15/2011    History  Smoking status  . Former Smoker  .  Quit date: 02/02/1985  Smokeless tobacco  . Not on file    History  Alcohol Use No    No family history on file.  Review of Systems: Constitutional: no fever chills diaphoresis or fatigue or change in weight.  Head and neck: no hearing loss, no epistaxis, no photophobia or visual disturbance. Respiratory: No cough, shortness of breath or wheezing. Cardiovascular: No chest pain peripheral edema, palpitations. Gastrointestinal: No abdominal distention, no abdominal pain, no change in bowel habits hematochezia or melena. Genitourinary: No dysuria, no frequency, no urgency, no nocturia. Musculoskeletal:No arthralgias, no back pain, no gait disturbance or myalgias. Neurological: No dizziness, no headaches, no numbness, no seizures, no syncope, no weakness, no tremors. Hematologic: No lymphadenopathy, no easy bruising. Psychiatric: No confusion, no hallucinations, no sleep disturbance.    Physical Exam: Filed Vitals:   11/01/12 1409  BP: 144/70  Pulse: 66   the general appearance well-developed well-nourished African American gentleman in no distress.The head and neck exam reveals pupils equal and reactive.  Extraocular movements are full.  There is no scleral icterus.  The mouth and pharynx are normal.  The neck is supple.  The carotids reveal no bruits.  The jugular venous pressure is normal.  The  thyroid is not enlarged.  There is no lymphadenopathy.  The chest is clear to percussion and auscultation.  There are no rales or rhonchi.  Expansion of the chest is symmetrical.  The precordium is quiet.  The first heart sound is normal.  The second heart sound is physiologically split.  There is no murmur gallop rub or click.  There is no abnormal lift or heave.  The abdomen is soft and nontender.  The bowel sounds are normal.  The liver and spleen are not enlarged.  There are no abdominal masses.  There are no abdominal bruits.  Extremities reveal good pedal pulses.  There is no phlebitis or  edema.  There is no cyanosis or clubbing.  Strength is normal and symmetrical in all extremities.  There is no lateralizing weakness.  There are no sensory deficits.  The skin is warm and dry.  There is no rash.     Assessment / Plan: Continue same medication.  Recheck in 6 months for followup office visit EKG lipid panel hepatic function panel basal metabolic panel and A1c.  Continue present diet.

## 2012-11-01 NOTE — Assessment & Plan Note (Signed)
The patient has a history of hypercholesterolemia.  He is on pravastatin.  He is not having any myalgias or side effects.  Blood work today is pending.

## 2012-11-01 NOTE — Assessment & Plan Note (Signed)
The patient has not been having any recurrent chest pain or angina.  He has not had to take any recent sublingual nitroglycerin.  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  He enjoys working.

## 2012-11-02 LAB — LDL CHOLESTEROL, DIRECT: Direct LDL: 182.5 mg/dL

## 2012-11-06 ENCOUNTER — Other Ambulatory Visit: Payer: Self-pay | Admitting: *Deleted

## 2012-11-06 DIAGNOSIS — E785 Hyperlipidemia, unspecified: Secondary | ICD-10-CM

## 2012-11-06 MED ORDER — ATORVASTATIN CALCIUM 40 MG PO TABS: 40.0000 mg | ORAL_TABLET | Freq: Every day | ORAL | Status: DC

## 2012-12-14 ENCOUNTER — Other Ambulatory Visit: Payer: Self-pay | Admitting: *Deleted

## 2012-12-14 ENCOUNTER — Telehealth: Payer: Self-pay | Admitting: *Deleted

## 2012-12-14 DIAGNOSIS — E785 Hyperlipidemia, unspecified: Secondary | ICD-10-CM

## 2012-12-14 MED ORDER — METFORMIN HCL 500 MG PO TABS: 500.0000 mg | ORAL_TABLET | Freq: Every day | ORAL | Status: DC

## 2012-12-14 MED ORDER — ATORVASTATIN CALCIUM 40 MG PO TABS: 40.0000 mg | ORAL_TABLET | Freq: Every day | ORAL | Status: DC

## 2012-12-14 NOTE — Telephone Encounter (Signed)
Called pt that refills for atorvastatin & metformin were sent in as requested. Mylo Red RN

## 2012-12-27 ENCOUNTER — Other Ambulatory Visit: Payer: Self-pay | Admitting: *Deleted

## 2012-12-27 DIAGNOSIS — I119 Hypertensive heart disease without heart failure: Secondary | ICD-10-CM

## 2012-12-27 MED ORDER — LISINOPRIL-HYDROCHLOROTHIAZIDE 20-12.5 MG PO TABS: 1.0000 | ORAL_TABLET | Freq: Every day | ORAL | Status: DC

## 2013-04-04 ENCOUNTER — Other Ambulatory Visit: Payer: Self-pay

## 2013-04-04 MED ORDER — METOPROLOL SUCCINATE ER 50 MG PO TB24: 50.0000 mg | ORAL_TABLET | Freq: Every day | ORAL | Status: DC

## 2013-04-04 MED ORDER — CLOPIDOGREL BISULFATE 75 MG PO TABS: 75.0000 mg | ORAL_TABLET | Freq: Every day | ORAL | Status: DC

## 2013-04-30 ENCOUNTER — Ambulatory Visit (INDEPENDENT_AMBULATORY_CARE_PROVIDER_SITE_OTHER): Payer: Medicare Other | Admitting: Cardiology

## 2013-04-30 ENCOUNTER — Encounter (INDEPENDENT_AMBULATORY_CARE_PROVIDER_SITE_OTHER): Payer: Self-pay

## 2013-04-30 ENCOUNTER — Encounter: Payer: Self-pay | Admitting: Cardiology

## 2013-04-30 VITALS — BP 148/76 | HR 51 | Ht 71.0 in | Wt 204.4 lb

## 2013-04-30 DIAGNOSIS — E7800 Pure hypercholesterolemia, unspecified: Secondary | ICD-10-CM

## 2013-04-30 DIAGNOSIS — I119 Hypertensive heart disease without heart failure: Secondary | ICD-10-CM | POA: Insufficient documentation

## 2013-04-30 DIAGNOSIS — I259 Chronic ischemic heart disease, unspecified: Secondary | ICD-10-CM

## 2013-04-30 LAB — HEMOGLOBIN A1C: Hgb A1c MFr Bld: 6.7 % — ABNORMAL HIGH (ref 4.6–6.5)

## 2013-04-30 LAB — HEPATIC FUNCTION PANEL
ALT: 14 U/L (ref 0–53)
AST: 15 U/L (ref 0–37)
Albumin: 3.8 g/dL (ref 3.5–5.2)
Total Bilirubin: 0.5 mg/dL (ref 0.3–1.2)
Total Protein: 7.5 g/dL (ref 6.0–8.3)

## 2013-04-30 LAB — LIPID PANEL
HDL: 43.8 mg/dL (ref >39.00)
Triglycerides: 85 mg/dL (ref 0.0–149.0)
VLDL: 17 mg/dL (ref 0.0–40.0)

## 2013-04-30 LAB — BASIC METABOLIC PANEL
BUN: 14 mg/dL (ref 6–23)
CO2: 32 mEq/L (ref 19–32)
GFR: 94.7 mL/min (ref >60.00)
Potassium: 3.4 mEq/L — ABNORMAL LOW (ref 3.5–5.1)

## 2013-04-30 NOTE — Assessment & Plan Note (Signed)
Since last visit the patient has had just one episode of substernal chest discomfort.  He described it as a burning.  It occurred at rest while he was watching television.  He used a single sublingual nitroglycerin with relief.  He has not had any exertional chest pain or any subsequent rest pain.  We'll continue current meds

## 2013-04-30 NOTE — Progress Notes (Signed)
[name] Date of Birth:  [birth year] [address] [place], [place]  [address] [phone]         Fax   [phone]  History of Present Illness: This [name] is seen for a scheduled 6 month followup office visit.  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 The patient has a history of known ischemic heart disease. 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 Since then he has not had any recurrent chest pain. Does carry sublingual nitroglycerin if he were to have pain. Patient has a history of hypercholesterolemia high blood pressure and diabetes. The patient is on a careful diet and has been losing weight intentionally.  He intends to start going to the [facility] to do regular exercise on machines.   Current Outpatient Prescriptions  Medication Sig Dispense Refill  . atorvastatin (LIPITOR) 40 MG tablet Take 1 tablet (40 mg total) by mouth daily.  30 tablet  6  . clopidogrel (PLAVIX) 75 MG tablet Take 1 tablet (75 mg total) by mouth daily.  30 tablet  5  . lisinopril-hydrochlorothiazide (PRINZIDE,ZESTORETIC) 20-12.5 MG per tablet Take 1 tablet by mouth daily.  30 tablet  3  . metFORMIN (GLUCOPHAGE) 500 MG tablet Take 1 tablet (500 mg total) by mouth daily with breakfast.  30 tablet  6  . metoprolol succinate (TOPROL-XL) 50 MG 24 hr tablet Take 1 tablet (50 mg total) by mouth daily. Take with or immediately following a meal.  30 tablet  5  . nitroGLYCERIN (NITROSTAT) 0.4 MG SL tablet Place 1 tablet (0.4 mg total) under the tongue every 5 (five) minutes as needed.  25 tablet  prn   No current facility-administered medications for this visit.    No Known Allergies  Patient Active Problem List   Diagnosis Date Noted  . Benign hypertensive heart disease without heart failure 04/30/2013  . Ischemic  heart disease 02/15/2011  . Hypercholesterolemia 02/15/2011  . Diabetes mellitus 02/15/2011    History  Smoking status  . Former Smoker  . Quit date: 02/02/1985  Smokeless tobacco  . Not on file    History  Alcohol Use No    History reviewed. No pertinent family history.  Review of Systems: Constitutional: no fever chills diaphoresis or fatigue or change in weight.  Head and neck: no hearing loss, no epistaxis, no photophobia or visual disturbance. Respiratory: No cough, shortness of breath or wheezing. Cardiovascular: No chest pain peripheral edema, palpitations. Gastrointestinal: No abdominal distention, no abdominal pain, no change in bowel habits hematochezia or melena. Genitourinary: No dysuria, no frequency, no urgency, no nocturia. Musculoskeletal:No arthralgias, no back pain, no gait disturbance or myalgias. Neurological: No dizziness, no headaches, no numbness, no seizures, no syncope, no weakness, no tremors. Hematologic: No lymphadenopathy, no easy bruising. Psychiatric: No confusion, no hallucinations, no sleep disturbance.    Physical Exam: Filed Vitals:   04/30/13 1059  BP: 148/76  Pulse: 51   the general appearance well-developed well-nourished African American gentleman in no distress.The head and neck exam reveals pupils equal and reactive.  Extraocular movements are full.  There is no scleral icterus.  The mouth and pharynx are normal.  The neck is supple.  The carotids reveal no bruits.  The jugular venous pressure is normal.  The  thyroid is not enlarged.  There is no lymphadenopathy.  The chest is clear to percussion and auscultation.  There are no rales or rhonchi.  Expansion of the chest is symmetrical.  The precordium is quiet.  The first heart sound is normal.  The second heart sound is physiologically split.  There is no murmur gallop rub or click.  There is no abnormal lift or heave.  The abdomen is soft and nontender.  The bowel sounds are normal.   The liver and spleen are not enlarged.  There are no abdominal masses.  There are no abdominal bruits.  Extremities reveal good pedal pulses.  There is no phlebitis or edema.  There is no cyanosis or clubbing.  Strength is normal and symmetrical in all extremities.  There is no lateralizing weakness.  There are no sensory deficits.  The skin is warm and dry.  There is no rash.  EKG shows sinus bradycardia at 51 per minute.  No ischemic changes.  First degree AV block with PR interval of 216 ms  Assessment / Plan: Continue same medication.  Recheck in 6 months for followup office visit EKG lipid panel hepatic function panel basal metabolic panel. Continue present diet.

## 2013-04-30 NOTE — Assessment & Plan Note (Signed)
Blood pressures remaining stable on current medication.  His weight is down 7 pounds since I last saw him intentionally

## 2013-04-30 NOTE — Assessment & Plan Note (Signed)
The patient is on Lipitor for his hypercholesterolemia.  He is not having any myalgias or other side effects.  Blood work today is pending

## 2013-04-30 NOTE — Patient Instructions (Signed)
Will obtain labs today and call you with the results (lp/bmet/hfpa1c)  Your physician recommends that you continue on your current medications as directed. Please refer to the Current Medication list given to you today.  Your physician wants you to follow-up in: 6 months with fasting labs (lp/bmet/hfp)  You will receive a reminder letter in the mail two months in advance. If you don't receive a letter, please call our office to schedule the follow-up appointment.  

## 2013-04-30 NOTE — Assessment & Plan Note (Signed)
Patient has not been having any hypoglycemic episodes. 

## 2013-05-01 NOTE — Progress Notes (Signed)
Quick Note:  Please report to patient. The recent labs are stable. Continue same medication and careful diet. Cholesterol is better. Her potassium is slightly low so eat bananas and high potassium foods ______

## 2013-05-07 ENCOUNTER — Other Ambulatory Visit: Payer: Self-pay | Admitting: Cardiology

## 2013-07-12 ENCOUNTER — Other Ambulatory Visit: Payer: Self-pay | Admitting: Cardiology

## 2013-08-04 ENCOUNTER — Other Ambulatory Visit: Payer: Self-pay | Admitting: Cardiology

## 2013-08-06 ENCOUNTER — Other Ambulatory Visit: Payer: Self-pay | Admitting: *Deleted

## 2013-08-06 MED ORDER — METFORMIN HCL 500 MG PO TABS: 500.0000 mg | ORAL_TABLET | Freq: Every day | ORAL | Status: DC

## 2013-10-15 ENCOUNTER — Other Ambulatory Visit: Payer: Self-pay | Admitting: Cardiology

## 2013-10-31 ENCOUNTER — Ambulatory Visit (INDEPENDENT_AMBULATORY_CARE_PROVIDER_SITE_OTHER): Payer: Medicare Other | Admitting: Cardiology

## 2013-10-31 ENCOUNTER — Encounter: Payer: Self-pay | Admitting: Cardiology

## 2013-10-31 VITALS — BP 140/70 | HR 60 | Ht 71.0 in | Wt 212.0 lb

## 2013-10-31 DIAGNOSIS — E7800 Pure hypercholesterolemia, unspecified: Secondary | ICD-10-CM

## 2013-10-31 DIAGNOSIS — I119 Hypertensive heart disease without heart failure: Secondary | ICD-10-CM

## 2013-10-31 DIAGNOSIS — IMO0001 Reserved for inherently not codable concepts without codable children: Secondary | ICD-10-CM

## 2013-10-31 DIAGNOSIS — I259 Chronic ischemic heart disease, unspecified: Secondary | ICD-10-CM

## 2013-10-31 DIAGNOSIS — E1165 Type 2 diabetes mellitus with hyperglycemia: Secondary | ICD-10-CM

## 2013-10-31 LAB — HEPATIC FUNCTION PANEL
ALK PHOS: 63 U/L (ref 39–117)
ALT: 11 U/L (ref 0–53)
AST: 12 U/L (ref 0–37)
Albumin: 3.8 g/dL (ref 3.5–5.2)
Bilirubin, Direct: 0.1 mg/dL (ref 0.0–0.3)
Total Bilirubin: 0.6 mg/dL (ref 0.2–1.2)
Total Protein: 7.5 g/dL (ref 6.0–8.3)

## 2013-10-31 LAB — LIPID PANEL
CHOL/HDL RATIO: 6
Cholesterol: 216 mg/dL — ABNORMAL HIGH (ref 0–200)
HDL: 37.6 mg/dL — ABNORMAL LOW (ref >39.00)
LDL CALC: 154 mg/dL — AB (ref 0–99)
TRIGLYCERIDES: 122 mg/dL (ref 0.0–149.0)
VLDL: 24.4 mg/dL (ref 0.0–40.0)

## 2013-10-31 LAB — BASIC METABOLIC PANEL
BUN: 20 mg/dL (ref 6–23)
CHLORIDE: 102 meq/L (ref 96–112)
CO2: 28 meq/L (ref 19–32)
Calcium: 9.8 mg/dL (ref 8.4–10.5)
Creatinine, Ser: 1.2 mg/dL (ref 0.4–1.5)
GFR: 77.23 mL/min (ref >60.00)
Glucose, Bld: 124 mg/dL — ABNORMAL HIGH (ref 70–99)
Potassium: 3.4 mEq/L — ABNORMAL LOW (ref 3.5–5.1)
Sodium: 138 mEq/L (ref 135–145)

## 2013-10-31 NOTE — Assessment & Plan Note (Signed)
The patient has not been having any hypoglycemic episodes 

## 2013-10-31 NOTE — Progress Notes (Signed)
[name] Date of Birth:  [birth year] [facility] [address] [place], [place]  [id number] [phone]        Fax   [phone]   History of Present Illness:  This pleasant 78 year old African American gentleman is seen for a scheduled 6 month followup office visit. 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 The patient has a history of known ischemic heart disease. 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 Since then he has not had any recurrent chest pain. Does carry sublingual nitroglycerin if he were to have pain. Patient has a history of hypercholesterolemia high blood pressure and diabetes.  The patient has not been getting as much regular exercise over the winter.  His weight is up 8 pounds.  Current Outpatient Prescriptions  Medication Sig Dispense Refill  . atorvastatin (LIPITOR) 40 MG tablet Take 1 tablet (40 mg total) by mouth daily.  30 tablet  6  . clopidogrel (PLAVIX) 75 MG tablet TAKE ONE TABLET BY MOUTH ONCE DAILY  30 tablet  0  . lisinopril-hydrochlorothiazide (PRINZIDE,ZESTORETIC) 20-12.5 MG per tablet TAKE ONE TABLET BY MOUTH ONCE DAILY  30 tablet  5  . metFORMIN (GLUCOPHAGE) 500 MG tablet Take 1 tablet (500 mg total) by mouth daily with breakfast.  30 tablet  4  . metoprolol succinate (TOPROL-XL) 50 MG 24 hr tablet TAKE ONE TABLET BY MOUTH ONCE DAILY. TAKE  WITH  OR  IMMEDIATELY  FOLLOWING  A  MEAL.  30 tablet  0  . nitroGLYCERIN (NITROSTAT) 0.4 MG SL tablet Place 1 tablet (0.4 mg total) under the tongue every 5 (five) minutes as needed.  25 tablet  prn   No current facility-administered medications for this visit.    No Known Allergies  Patient Active Problem List   Diagnosis Date Noted  . Benign hypertensive heart disease without heart failure 04/30/2013  . Ischemic heart disease 02/15/2011  .  Hypercholesterolemia 02/15/2011  . Diabetes mellitus 02/15/2011    History  Smoking status  . Former Smoker  . Quit date: 02/02/1985  Smokeless tobacco  . Not on file    History  Alcohol Use No    History reviewed. No pertinent family history.  Review of Systems: Constitutional: no fever chills diaphoresis or fatigue or change in weight.  Head and neck: no hearing loss, no epistaxis, no photophobia or visual disturbance. Respiratory: No cough, shortness of breath or wheezing. Cardiovascular: No chest pain peripheral edema, palpitations. Gastrointestinal: No abdominal distention, no abdominal pain, no change in bowel habits hematochezia or melena. Genitourinary: No dysuria, no frequency, no urgency, no nocturia. Musculoskeletal:No arthralgias, no back pain, no gait disturbance or myalgias. Neurological: No dizziness, no headaches, no numbness, no seizures, no syncope, no weakness, no tremors. Hematologic: No lymphadenopathy, no easy bruising. Psychiatric: No confusion, no hallucinations, no sleep disturbance.    Physical Exam: Filed Vitals:   10/31/13 0928  BP: 140/70  Pulse: 60   the general appearance reveals a elderly gentleman in no acute distress.The head and neck exam reveals pupils equal and reactive.  Extraocular movements are full.  There is no scleral icterus.  The mouth and pharynx are normal.  The neck is supple.  The carotids reveal no bruits.  The jugular venous pressure is normal.  The  thyroid is not enlarged.  There is no lymphadenopathy.  The  chest is clear to percussion and auscultation.  There are no rales or rhonchi.  Expansion of the chest is symmetrical.  The precordium is quiet.  The first heart sound is normal.  The second heart sound is physiologically split.  There is no murmur gallop rub or click.  There is no abnormal lift or heave.  The abdomen is soft and nontender.  The bowel sounds are normal.  The liver and spleen are not enlarged.  There are no  abdominal masses.  There are no abdominal bruits.  Extremities reveal good pedal pulses.  There is no phlebitis or edema.  There is no cyanosis or clubbing.  Strength is normal and symmetrical in all extremities.  There is no lateralizing weakness.  There are no sensory deficits.  The skin is warm and dry.  There is no rash.     Assessment / Plan: 1. ischemic heart disease status post stenting of left circumflex with a drug-eluting stent in July 2008.  No recurrent angina. 2. benign hypertensive heart disease without heart failure. 3. Hypercholesterolemia. 4.  Diabetes mellitus  Disposition continue on current medication.  Work harder on diet and weight loss.  We are checking laboratory studies today.  Recheck in 6 months for office visit EKG lipid panel basal metabolic panel hepatic function panel and A1c.

## 2013-10-31 NOTE — Assessment & Plan Note (Signed)
The patient is on Lipitor generic 40 mg daily.  No myalgias.  Lab work is pending.

## 2013-10-31 NOTE — Assessment & Plan Note (Signed)
The patient has not had any symptoms of congestive heart failure. 

## 2013-10-31 NOTE — Assessment & Plan Note (Signed)
The patient has not had any recurrent chest pain or angina. 

## 2013-10-31 NOTE — Patient Instructions (Signed)
Will obtain labs today and call you with the results (lp/bmet/hfp)  Work harder on diet and weight loss  Your physician recommends that you continue on your current medications as directed. Please refer to the Current Medication list given to you today.  Your physician wants you to follow-up in: 6 months with fasting labs (lp/bmet/hfp/a1c)  You will receive a reminder letter in the mail two months in advance. If you don't receive a letter, please call our office to schedule the follow-up appointment,

## 2013-11-02 ENCOUNTER — Telehealth: Payer: Self-pay | Admitting: *Deleted

## 2013-11-02 NOTE — Telephone Encounter (Signed)
Advised patient of lab results  

## 2013-11-02 NOTE — Telephone Encounter (Signed)
Message copied by Burnell BlanksPRATT, Sarvesh Meddaugh B on Fri Nov 02, 2013  3:47 PM ------      Message from: Cassell ClementBRACKBILL, THOMAS      Created: Thu Nov 01, 2013  9:08 PM       LDL too high.  Watch diet better. K is low. Increase high K foods. ------

## 2013-11-16 ENCOUNTER — Other Ambulatory Visit: Payer: Self-pay | Admitting: Cardiology

## 2013-12-20 ENCOUNTER — Other Ambulatory Visit: Payer: Self-pay | Admitting: Cardiology

## 2014-03-13 ENCOUNTER — Other Ambulatory Visit: Payer: Self-pay | Admitting: *Deleted

## 2014-03-13 MED ORDER — ATORVASTATIN CALCIUM 40 MG PO TABS: ORAL_TABLET | ORAL | Status: DC

## 2014-03-13 MED ORDER — LISINOPRIL-HYDROCHLOROTHIAZIDE 20-12.5 MG PO TABS: ORAL_TABLET | ORAL | Status: DC

## 2014-03-13 NOTE — Telephone Encounter (Signed)
Ok to refill 

## 2014-03-14 MED ORDER — METOPROLOL SUCCINATE ER 50 MG PO TB24: ORAL_TABLET | ORAL | Status: DC

## 2014-03-25 ENCOUNTER — Other Ambulatory Visit: Payer: Self-pay | Admitting: Cardiology

## 2014-05-29 ENCOUNTER — Other Ambulatory Visit: Payer: Self-pay | Admitting: Cardiology

## 2014-06-28 ENCOUNTER — Other Ambulatory Visit: Payer: Self-pay | Admitting: Cardiology

## 2014-07-02 ENCOUNTER — Other Ambulatory Visit: Payer: Self-pay

## 2014-07-02 MED ORDER — LISINOPRIL-HYDROCHLOROTHIAZIDE 20-12.5 MG PO TABS: ORAL_TABLET | ORAL | Status: DC

## 2014-07-05 ENCOUNTER — Ambulatory Visit (INDEPENDENT_AMBULATORY_CARE_PROVIDER_SITE_OTHER): Payer: Medicare Other | Admitting: Cardiology

## 2014-07-05 ENCOUNTER — Encounter: Payer: Self-pay | Admitting: Cardiology

## 2014-07-05 VITALS — BP 146/88 | HR 54 | Wt 210.0 lb

## 2014-07-05 DIAGNOSIS — IMO0002 Reserved for concepts with insufficient information to code with codable children: Secondary | ICD-10-CM

## 2014-07-05 DIAGNOSIS — I119 Hypertensive heart disease without heart failure: Secondary | ICD-10-CM

## 2014-07-05 DIAGNOSIS — E7800 Pure hypercholesterolemia, unspecified: Secondary | ICD-10-CM

## 2014-07-05 DIAGNOSIS — E1165 Type 2 diabetes mellitus with hyperglycemia: Secondary | ICD-10-CM

## 2014-07-05 DIAGNOSIS — I259 Chronic ischemic heart disease, unspecified: Secondary | ICD-10-CM

## 2014-07-05 LAB — BASIC METABOLIC PANEL
BUN: 19 mg/dL (ref 6–23)
CO2: 29 meq/L (ref 19–32)
Calcium: 9.6 mg/dL (ref 8.4–10.5)
Chloride: 103 mEq/L (ref 96–112)
Creatinine, Ser: 1.1 mg/dL (ref 0.4–1.5)
GFR: 82.73 mL/min (ref >60.00)
GLUCOSE: 102 mg/dL — AB (ref 70–99)
Potassium: 3.6 mEq/L (ref 3.5–5.1)
SODIUM: 140 meq/L (ref 135–145)

## 2014-07-05 LAB — HEPATIC FUNCTION PANEL
ALBUMIN: 3.9 g/dL (ref 3.5–5.2)
ALT: 8 U/L (ref 0–53)
AST: 11 U/L (ref 0–37)
Alkaline Phosphatase: 74 U/L (ref 39–117)
BILIRUBIN DIRECT: 0.1 mg/dL (ref 0.0–0.3)
TOTAL PROTEIN: 7.6 g/dL (ref 6.0–8.3)
Total Bilirubin: 0.8 mg/dL (ref 0.2–1.2)

## 2014-07-05 LAB — LIPID PANEL
Cholesterol: 237 mg/dL — ABNORMAL HIGH (ref 0–200)
HDL: 29.4 mg/dL — AB (ref >39.00)
LDL Cholesterol: 177 mg/dL — ABNORMAL HIGH (ref 0–99)
NONHDL: 207.6
TRIGLYCERIDES: 152 mg/dL — AB (ref 0.0–149.0)
Total CHOL/HDL Ratio: 8
VLDL: 30.4 mg/dL (ref 0.0–40.0)

## 2014-07-05 LAB — HEMOGLOBIN A1C: HEMOGLOBIN A1C: 6.9 % — AB (ref 4.6–6.5)

## 2014-07-05 NOTE — Progress Notes (Signed)
Allen ForemanJohn L Arista Date of Birth:  10-23-1935 [facility] [address] [place], [place]  [id number] [phone]        Fax   [phone]   History of Present Illness:  This pleasant 79 year old African American gentleman is seen for a scheduled 6 month followup office visit. 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  He previously worked for [place]. The patient has a history of known ischemic heart disease. 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 Since then he has not had any recurrent chest pain. Does carry sublingual nitroglycerin if he were to have pain. Patient has a history of hypercholesterolemia high blood pressure and diabetes.   The patient has not been expressing any chest pain or shortness of breath.  He has not been having any hypoglycemia.  He is on metformin once a day.  His weight has been stable. He has a history of hypercholesterolemia.  He is on Lipitor 40 mg daily.  He is not having a myalgias.  Current Outpatient Prescriptions  Medication Sig Dispense Refill  . atorvastatin (LIPITOR) 40 MG tablet TAKE ONE TABLET BY MOUTH ONCE DAILY 90 tablet 0  . clopidogrel (PLAVIX) 75 MG tablet TAKE ONE TABLET BY MOUTH ONCE DAILY 30 tablet 0  . lisinopril-hydrochlorothiazide (PRINZIDE,ZESTORETIC) 20-12.5 MG per tablet TAKE ONE TABLET BY MOUTH ONCE DAILY 90 tablet 0  . metFORMIN (GLUCOPHAGE) 500 MG tablet Take by mouth daily.    . metoprolol succinate (TOPROL-XL) 50 MG 24 hr tablet TAKE ONE TABLET BY MOUTH ONCE DAILY. TAKE  WITH  OR  IMMEDIATELY  FOLLOWING  A  MEAL. 90 tablet 5  . nitroGLYCERIN (NITROSTAT) 0.4 MG SL tablet Place 1 tablet (0.4 mg total) under the tongue every 5 (five) minutes as needed. 25 tablet prn   No current facility-administered medications for this visit.    No Known Allergies  Patient  Active Problem List   Diagnosis Date Noted  . Benign hypertensive heart disease without heart failure 04/30/2013  . Ischemic heart disease 02/15/2011  . Hypercholesterolemia 02/15/2011  . Type II or unspecified type diabetes mellitus without mention of complication, uncontrolled 02/15/2011    History  Smoking status  . Former Smoker  . Quit date: 02/02/1985  Smokeless tobacco  . Not on file    History  Alcohol Use No    History reviewed. No pertinent family history.  Review of Systems: Constitutional: no fever chills diaphoresis or fatigue or change in weight.  Head and neck: no hearing loss, no epistaxis, no photophobia or visual disturbance. Respiratory: No cough, shortness of breath or wheezing. Cardiovascular: No chest pain peripheral edema, palpitations. Gastrointestinal: No abdominal distention, no abdominal pain, no change in bowel habits hematochezia or melena. Genitourinary: No dysuria, no frequency, no urgency, no nocturia. Musculoskeletal:No arthralgias, no back pain, no gait disturbance or myalgias. Neurological: No dizziness, no headaches, no numbness, no seizures, no syncope, no weakness, no tremors. Hematologic: No lymphadenopathy, no easy bruising. Psychiatric: No confusion, no hallucinations, no sleep disturbance.    Physical Exam: Filed Vitals:   07/05/14 0848  BP: 146/88  Pulse: 54   the general appearance reveals a elderly gentleman in no acute distress.The head and neck exam reveals pupils equal and reactive.  Extraocular movements are full.  There is no scleral icterus.  The mouth and pharynx  are normal.  The neck is supple.  The carotids reveal no bruits.  The jugular venous pressure is normal.  The  thyroid is not enlarged.  There is no lymphadenopathy.  The chest is clear to percussion and auscultation.  There are no rales or rhonchi.  Expansion of the chest is symmetrical.  The precordium is quiet.  The first heart sound is normal.  The second heart  sound is physiologically split.  There is no murmur gallop rub or click.  There is no abnormal lift or heave.  The abdomen is soft and nontender.  The bowel sounds are normal.  The liver and spleen are not enlarged.  There are no abdominal masses.  There are no abdominal bruits.  Extremities reveal good pedal pulses.  There is no phlebitis or edema.  There is no cyanosis or clubbing.  Strength is normal and symmetrical in all extremities.  There is no lateralizing weakness.  There are no sensory deficits.  The skin is warm and dry.  There is no rash.  EKG today shows sinus bradycardia and no ischemic changes.   Assessment / Plan: 1. ischemic heart disease status post stenting of left circumflex with a drug-eluting stent in July 2008.  No recurrent angina. 2. benign hypertensive heart disease without heart failure.  Blood pressure is remaining normal. 3. Hypercholesterolemia.  Lab work today pending. 4.  Diabetes mellitus.  Stable.  A1c pending.  Disposition continue on current medication.  Continue on careful diet and weight loss.  We are checking laboratory studies today.  Recheck in 6 months for office visit  lipid panel basal metabolic panel hepatic function panel and A1c.

## 2014-07-05 NOTE — Patient Instructions (Signed)
Will obtain labs today and call you with the results (LP.BMET.HFP.A1C)  Your physician recommends that you continue on your current medications as directed. Please refer to the Current Medication list given to you today.  Your physician wants you to follow-up in: 6 months with fasting labs (lp/bmet/hfp/A1C You will receive a reminder letter in the mail two months in advance. If you don't receive a letter, please call our office to schedule the follow-up appointment.  

## 2014-07-09 ENCOUNTER — Telehealth: Payer: Self-pay | Admitting: *Deleted

## 2014-07-09 MED ORDER — ATORVASTATIN CALCIUM 80 MG PO TABS: ORAL_TABLET | ORAL | Status: DC

## 2014-07-09 NOTE — Progress Notes (Signed)
Increase lipitor to 80 mg daily   

## 2014-07-09 NOTE — Telephone Encounter (Signed)
Patient advised of increase in Lipitor   Notes Recorded by Burnell BlanksMelinda B Collene Massimino on 07/09/2014 at 10:55 AM Patient states he is taking his Lipitor 40 mg every day. Will forward to Dr. Patty SermonsBrackbill for review Notes Recorded by Cassell Clementhomas Brackbill, MD on 07/07/2014 at 4:10 PM Please report. The lipids are not as good. Has he been taking his lipitor 40 mg every day?? Watch diet. CSD. A1C 6.9 slightly higher.

## 2014-07-09 NOTE — Telephone Encounter (Signed)
-----   Message from Cassell Clementhomas Brackbill, MD sent at 07/09/2014 11:31 AM EST ----- Have him increase lipitor to 80 mg daily

## 2014-08-03 ENCOUNTER — Other Ambulatory Visit: Payer: Self-pay | Admitting: Cardiology

## 2014-08-09 ENCOUNTER — Other Ambulatory Visit: Payer: Self-pay

## 2014-08-09 MED ORDER — ATORVASTATIN CALCIUM 80 MG PO TABS: ORAL_TABLET | ORAL | Status: DC

## 2014-10-02 ENCOUNTER — Other Ambulatory Visit: Payer: Self-pay | Admitting: Cardiology

## 2014-10-21 ENCOUNTER — Other Ambulatory Visit: Payer: Self-pay

## 2014-10-21 MED ORDER — NITROGLYCERIN 0.4 MG SL SUBL: 0.4000 mg | SUBLINGUAL_TABLET | SUBLINGUAL | Status: DC | PRN

## 2014-12-30 ENCOUNTER — Other Ambulatory Visit: Payer: Self-pay | Admitting: Cardiology

## 2015-01-30 ENCOUNTER — Other Ambulatory Visit: Payer: Self-pay | Admitting: Cardiology

## 2015-04-02 ENCOUNTER — Other Ambulatory Visit: Payer: Self-pay | Admitting: Cardiology

## 2015-04-08 ENCOUNTER — Other Ambulatory Visit: Payer: Self-pay | Admitting: Cardiology

## 2015-05-08 ENCOUNTER — Ambulatory Visit: Payer: Medicare Other | Admitting: Cardiology

## 2015-05-08 DIAGNOSIS — R0989 Other specified symptoms and signs involving the circulatory and respiratory systems: Secondary | ICD-10-CM

## 2015-05-16 ENCOUNTER — Encounter: Payer: Self-pay | Admitting: Cardiology

## 2015-05-31 ENCOUNTER — Other Ambulatory Visit: Payer: Self-pay | Admitting: Cardiology

## 2015-06-06 ENCOUNTER — Other Ambulatory Visit: Payer: Self-pay | Admitting: Cardiology

## 2015-07-12 ENCOUNTER — Other Ambulatory Visit: Payer: Self-pay | Admitting: Cardiology

## 2015-07-15 ENCOUNTER — Other Ambulatory Visit: Payer: Self-pay | Admitting: Cardiology

## 2015-07-16 NOTE — Telephone Encounter (Signed)
Medication Detail      Disp Refills Start End     lisinopril-hydrochlorothiazide (PRINZIDE,ZESTORETIC) 20-12.5 MG tablet 90 tablet 0 07/14/2015     Sig: TAKE ONE TABLET BY MOUTH ONCE DAILY    E-Prescribing Status: Receipt confirmed by pharmacy (07/14/2015 8:39 AM EST)     Pharmacy    [id number] - [place] ([place]), [place] - [address]

## 2015-07-17 ENCOUNTER — Ambulatory Visit (INDEPENDENT_AMBULATORY_CARE_PROVIDER_SITE_OTHER): Payer: Medicare Other | Admitting: Cardiology

## 2015-07-17 ENCOUNTER — Encounter: Payer: Self-pay | Admitting: Cardiology

## 2015-07-17 VITALS — BP 160/90 | HR 55 | Ht 71.0 in | Wt 213.1 lb

## 2015-07-17 DIAGNOSIS — I119 Hypertensive heart disease without heart failure: Secondary | ICD-10-CM | POA: Diagnosis not present

## 2015-07-17 DIAGNOSIS — I259 Chronic ischemic heart disease, unspecified: Secondary | ICD-10-CM

## 2015-07-17 DIAGNOSIS — E1165 Type 2 diabetes mellitus with hyperglycemia: Secondary | ICD-10-CM

## 2015-07-17 DIAGNOSIS — IMO0001 Reserved for inherently not codable concepts without codable children: Secondary | ICD-10-CM

## 2015-07-17 LAB — BASIC METABOLIC PANEL
BUN: 16 mg/dL (ref 7–25)
CALCIUM: 10 mg/dL (ref 8.6–10.3)
CO2: 29 mmol/L (ref 20–31)
Chloride: 100 mmol/L (ref 98–110)
Creat: 0.92 mg/dL (ref 0.70–1.18)
GLUCOSE: 112 mg/dL — AB (ref 65–99)
Potassium: 3.6 mmol/L (ref 3.5–5.3)
SODIUM: 136 mmol/L (ref 135–146)

## 2015-07-17 LAB — HEPATIC FUNCTION PANEL
ALBUMIN: 4 g/dL (ref 3.6–5.1)
ALT: 16 U/L (ref 9–46)
AST: 14 U/L (ref 10–35)
Alkaline Phosphatase: 61 U/L (ref 40–115)
BILIRUBIN DIRECT: 0.1 mg/dL (ref <=0.2)
Indirect Bilirubin: 0.6 mg/dL (ref 0.2–1.2)
Total Bilirubin: 0.7 mg/dL (ref 0.2–1.2)
Total Protein: 7.4 g/dL (ref 6.1–8.1)

## 2015-07-17 LAB — LIPID PANEL
CHOL/HDL RATIO: 6.1 ratio — AB (ref <=5.0)
CHOLESTEROL: 225 mg/dL — AB (ref 125–200)
HDL: 37 mg/dL — AB (ref >=40)
LDL Cholesterol: 159 mg/dL — ABNORMAL HIGH (ref <130)
TRIGLYCERIDES: 144 mg/dL (ref <150)
VLDL: 29 mg/dL (ref <30)

## 2015-07-17 NOTE — Progress Notes (Signed)
Cardiology Office Note   Date:  07/17/2015   ID:  [name], DOB [birth year], MRN [id number]  PCP:  No PCP Per Patient  Cardiologist: Cassell Clement MD  Chief Complaint  Patient presents with  . routine follow up    Patient denies any chest pain, shortness of breath, le edema, claudication       History of Present Illness: [name] is a 80 y.o. male who presents for one-year follow-up office visit  .  The patient has a history of known ischemic heart disease. 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 Since then he has not had any recurrent chest pain. Does carry sublingual nitroglycerin if he were to have pain. Patient has a history of hypercholesterolemia high blood pressure and diabetes.  The patient has not been expressing any chest pain or shortness of breath. He has not been having any hypoglycemia. He is on metformin once a day. His weight has been stable. He has a history of hypercholesterolemia. He is on Lipitor 40 mg daily. He is not having a myalgias. He gets his exercise by doing yard work around his house. He has not been having any dizzy spells or syncope.  No palpitations.  Past Medical History  Diagnosis Date  . IHD (ischemic heart disease)   . Acute MI, subendocardial (HCC)   . Coronary artery disease   . Hyperlipidemia   . HCD (hypertensive cardiovascular disease)     Past Surgical History  Procedure Laterality Date  . Cardiac catheterization  01/02/2007    EF 50%  . Coronary stent placement      LEFT CIRCUMFLEX     Current Outpatient Prescriptions  Medication Sig Dispense Refill  . atorvastatin (LIPITOR) 80 MG tablet TAKE ONE TABLET BY MOUTH ONCE DAILY 90 tablet 2  . clopidogrel (PLAVIX) 75 MG tablet TAKE ONE TABLET BY MOUTH ONCE DAILY 30 tablet 6  . lisinopril-hydrochlorothiazide (PRINZIDE,ZESTORETIC) 20-12.5 MG  tablet TAKE ONE TABLET BY MOUTH ONCE DAILY 90 tablet 0  . metFORMIN (GLUCOPHAGE) 500 MG tablet TAKE ONE TABLET BY MOUTH ONCE DAILY WITH  BREAKFAST 30 tablet 1  . metoprolol succinate (TOPROL-XL) 50 MG 24 hr tablet TAKE ONE TABLET BY MOUTH ONCE DAILY (TAKE  WITH  OR  IMMEDIATELY  FOLLOWING  A  MEAL) 90 tablet 3  . nitroGLYCERIN (NITROSTAT) 0.4 MG SL tablet Place 0.4 mg under the tongue every 5 (five) minutes as needed for chest pain.     No current facility-administered medications for this visit.    Allergies:   Review of patient's allergies indicates no known allergies.    Social History:  The patient  reports that he quit smoking about 30 years ago. He does not have any smokeless tobacco history on file. He reports that he does not drink alcohol or use illicit drugs.   Family History:  The patient's family history includes Healthy in his father and mother.    ROS:  Please see the history of present illness.   Otherwise, review of systems are positive for none.   All other systems are reviewed and negative.    PHYSICAL EXAM: VS:  BP 160/90 mmHg  Pulse 55  Ht  (1.803 m)  Wt 213 lb 1.9 oz (96.671 kg)  BMI 29.74 kg/m2 , BMI Body mass index is 29.74 kg/(m^2). GEN: Well nourished, well developed, in no acute distress  HEENT: normal Neck: no JVD, carotid bruits, or masses Cardiac: RRR; no murmurs, rubs, or gallops,no edema  Respiratory:  clear to auscultation bilaterally, normal work of breathing GI: soft, nontender, nondistended, + BS MS: no deformity or atrophy Skin: warm and dry, no rash Neuro:  Strength and sensation are intact Psych: euthymic mood, full affect   EKG:  EKG is ordered today. The ekg ordered today demonstrates sinus bradycardia at 55 bpm.  Since last tracing of 07/05/14, no significant change   Recent Labs: No results found for requested labs within last 365 days.    Lipid Panel    Component Value Date/Time   CHOL 237* 07/05/2014 0932   TRIG 152.0*  07/05/2014 0932   HDL 29.40* 07/05/2014 0932   CHOLHDL 8 07/05/2014 0932   VLDL 30.4 07/05/2014 0932   LDLCALC 177* 07/05/2014 0932   LDLDIRECT 182.5 11/01/2012 1434      Wt Readings from Last 3 Encounters:  07/17/15 213 lb 1.9 oz (96.671 kg)  07/05/14 210 lb (95.255 kg)  10/31/13 212 lb (96.163 kg)        ASSESSMENT AND PLAN:  1. ischemic heart disease status post stenting of left circumflex with a drug-eluting stent in July 2008. No recurrent angina. 2. benign hypertensive heart disease without heart failure. Blood pressure his elevated today but he did not take any of his blood pressure pills today.. 3. Hypercholesterolemia. Lab work today pending. 4. Diabetes mellitus. Stable. A1c pending.   Current medicines are reviewed at length with the patient today.  The patient does not have concerns regarding medicines.  The following changes have been made:  no change  Labs/ tests ordered today include:  No orders of the defined types were placed in this encounter.    Disposition: Continue same medication.  Recheck in one year for follow-up office visit and EKG with Dr.Nishan   Signed, Cassell Clement MD 07/17/2015 2:10 PM    [facility] [address] [place], [place], [place]  [address] Phone: [phone]; Fax: [phone]

## 2015-07-17 NOTE — Patient Instructions (Addendum)
Medication Instructions:  Your physician recommends that you continue on your current medications as directed. Please refer to the Current Medication list given to you today.  Labwork: Lp/bmet/hfp  Testing/Procedures: none  Follow-Up: Your physician wants you to follow-up in: 1 year ov with Dr Haywood Filler will receive a reminder letter in the mail two months in advance. If you don't receive a letter, please call our office to schedule the follow-up appointment.  If you need a refill on your cardiac medications before your next appointment, please call your pharmacy.

## 2015-07-18 LAB — HEMOGLOBIN A1C
HEMOGLOBIN A1C: 6.8 % — AB (ref <5.7)
Mean Plasma Glucose: 148 mg/dL — ABNORMAL HIGH (ref <117)

## 2015-07-18 NOTE — Progress Notes (Signed)
Quick Note:  Please report to patient. The recent labs are stable. Continue same medication and careful diet. BS still too high. Watch diet and lose more weight. ______

## 2015-08-08 ENCOUNTER — Other Ambulatory Visit: Payer: Self-pay | Admitting: Cardiology

## 2015-09-10 ENCOUNTER — Other Ambulatory Visit: Payer: Self-pay | Admitting: Cardiology

## 2015-09-12 ENCOUNTER — Other Ambulatory Visit: Payer: Self-pay

## 2015-09-12 ENCOUNTER — Other Ambulatory Visit: Payer: Self-pay | Admitting: *Deleted

## 2015-09-12 MED ORDER — METOPROLOL SUCCINATE ER 50 MG PO TB24: 50.0000 mg | ORAL_TABLET | Freq: Every day | ORAL | Status: DC

## 2015-09-12 NOTE — Telephone Encounter (Signed)
Called patient and informed him that he needs to make a new patient appointment with a PCP. Gave patient [facility] number to call and make an appointment. Informed patient that Dr. Eden EmmsNishan usually only refills cardiac medications. Informed patient that we will send Dr. Eden EmmsNishan the message that patient has no one to refill his metformin, since Dr Patty SermonsBrackbill has retired. Will forward to Dr. Eden EmmsNishan for advisement.

## 2015-09-12 NOTE — Telephone Encounter (Signed)
Patient called and is insisting that he needs a refill on his metformin. I made him aware that he needs to get this medication from his pcp and he stated that Dr Patty SermonsBrackbill is his pcp. I informed him that Dr Patty SermonsBrackbill has retired. He wanted to see if the cardiologist that Dr Patty SermonsBrackbill referred him to, which is Dr Eden EmmsNishan, would be willing to refill this for him. Please advise. Thanks, MI

## 2015-09-13 NOTE — Telephone Encounter (Signed)
Can call in script for a month or two but needs primary to take over this

## 2015-09-15 ENCOUNTER — Other Ambulatory Visit: Payer: Self-pay | Admitting: *Deleted

## 2015-09-15 ENCOUNTER — Telehealth: Payer: Self-pay | Admitting: Cardiology

## 2015-09-15 MED ORDER — METFORMIN HCL 500 MG PO TABS: 500.0000 mg | ORAL_TABLET | Freq: Every day | ORAL | Status: AC

## 2015-09-15 MED ORDER — CLOPIDOGREL BISULFATE 75 MG PO TABS: 75.0000 mg | ORAL_TABLET | Freq: Every day | ORAL | Status: AC

## 2015-09-15 NOTE — Addendum Note (Signed)
Addended by: Virl AxePATE INGALLS, Malasia Torain L on: 09/15/2015 08:34 AM   Modules accepted: Orders

## 2015-09-15 NOTE — Telephone Encounter (Signed)
NEW MESSAGE   *STAT* If patient is at the pharmacy, call can be transferred to refill team.   1. Which medications need to be refilled? (please list name of each medication and dose if known) merformin 500mg , clopidogrel 75mg   2. Which pharmacy/location (including street and city if local pharmacy) is medication to be sent to? [facility] on [address]  3. Do they need a 30 day or 90 day supply? Did not specify

## 2015-09-16 ENCOUNTER — Other Ambulatory Visit: Payer: Self-pay | Admitting: Cardiology

## 2015-10-17 ENCOUNTER — Other Ambulatory Visit: Payer: Self-pay

## 2015-10-17 MED ORDER — LISINOPRIL-HYDROCHLOROTHIAZIDE 20-12.5 MG PO TABS: 1.0000 | ORAL_TABLET | Freq: Every day | ORAL | Status: DC

## 2015-11-17 ENCOUNTER — Other Ambulatory Visit: Payer: Self-pay | Admitting: Cardiovascular Disease

## 2015-11-20 ENCOUNTER — Other Ambulatory Visit: Payer: Self-pay | Admitting: Cardiovascular Disease

## 2015-11-26 ENCOUNTER — Other Ambulatory Visit: Payer: Self-pay | Admitting: Cardiovascular Disease

## 2016-04-13 ENCOUNTER — Telehealth: Payer: Self-pay | Admitting: Cardiovascular Disease

## 2016-04-13 NOTE — Telephone Encounter (Signed)
New message ° ° ° ° °*STAT* If patient is at the pharmacy, call can be transferred to refill team. ° ° °1. Which medications need to be refilled? (please list name of each medication and dose if known) ***** ° °2. Which pharmacy/location (including street and city if local pharmacy) is medication to be sent to?**** ° °3. Do they need a 30 day or 90 day supply? *** ° °

## 2016-05-21 ENCOUNTER — Other Ambulatory Visit: Payer: Self-pay | Admitting: Cardiovascular Disease

## 2016-05-24 NOTE — Telephone Encounter (Signed)
Medication Detail    Disp Refills Start End   lisinopril-hydrochlorothiazide (PRINZIDE,ZESTORETIC) 20-12.5 MG tablet 90 tablet 2 10/17/2015    Sig - Route: Take 1 tablet by mouth daily. - Oral   E-Prescribing Status: Receipt confirmed by pharmacy (10/17/2015 9:46 AM EDT)   Pharmacy   [id number] - [place] ([place]), [place] - [address]

## 2016-06-22 ENCOUNTER — Other Ambulatory Visit: Payer: Self-pay | Admitting: Cardiovascular Disease

## 2016-07-15 NOTE — Progress Notes (Signed)
Cardiology Office Note   Date:  07/16/2016   ID:  Allen ForemanJohn L Bennis, DOB 02-Mar-1936, MRN [id number]  PCP:  No PCP Per Patient  Cardiologist: Cassell Clementhomas Brackbill MD -> [facility]  No chief complaint on file.      History of Present Illness: [name] is a 81 y.o. male who presents for one-year follow-up office visit New to me previous patient of Dr Patty SermonsBrackbill  .  The patient has a history of known ischemic heart disease. 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 Since then he has not had any recurrent chest pain. Does carry sublingual nitroglycerin if he were to have pain. Patient has a history of hypercholesterolemia high blood pressure and diabetes.  The patient has not been expressing any chest pain or shortness of breath. He has not been having any hypoglycemia. He is on metformin once a day. His weight has been stable. He has a history of hypercholesterolemia.Lipitor increased last year by TB He gets his exercise by doing yard work around his house. He has not been having any dizzy spells or syncope.  No palpitations.  Wife of 53 years and some family members he cares for Has 3 children and 8 grandchildren  Past Medical History:  Diagnosis Date  . Acute MI, subendocardial (HCC)   . Coronary artery disease   . HCD (hypertensive cardiovascular disease)   . Hyperlipidemia   . IHD (ischemic heart disease)     Past Surgical History:  Procedure Laterality Date  . CARDIAC CATHETERIZATION  01/02/2007   EF 50%  . CORONARY STENT PLACEMENT     LEFT CIRCUMFLEX     Current Outpatient Prescriptions  Medication Sig Dispense Refill  . atorvastatin (LIPITOR) 80 MG tablet TAKE ONE TABLET BY MOUTH ONCE DAILY 90 tablet 2  . clopidogrel (PLAVIX) 75 MG tablet Take 1 tablet (75 mg total) by mouth daily. 30 tablet 9  . lisinopril-hydrochlorothiazide (PRINZIDE,ZESTORETIC)  20-12.5 MG tablet TAKE ONE TABLET BY MOUTH ONCE DAILY 30 tablet 0  . metFORMIN (GLUCOPHAGE) 500 MG tablet Take 1 tablet (500 mg total) by mouth daily with breakfast. 30 tablet 1  . metoprolol succinate (TOPROL-XL) 50 MG 24 hr tablet Take 1 tablet (50 mg total) by mouth daily. Take with or immediately following a meal. 90 tablet 0  . nitroGLYCERIN (NITROSTAT) 0.4 MG SL tablet Place 0.4 mg under the tongue every 5 (five) minutes as needed for chest pain.     No current facility-administered medications for this visit.     Allergies:   Patient has no known allergies.    Social History:  The patient  reports that he quit smoking about 31 years ago. He does not have any smokeless tobacco history on file. He reports that he does not drink alcohol or use drugs.   Family History:  The patient's family history includes Healthy in his father and mother.    ROS:  Please see the history of present illness.   Otherwise, review of systems are positive for none.   All other systems are reviewed and negative.    PHYSICAL EXAM: VS:  BP 132/80 (BP Location: Right Arm, Patient Position: Sitting, Cuff Size: Large)   Pulse 68   Resp 20   Wt 211 lb (95.7 kg)   SpO2 98%   BMI 29.43 kg/m  , BMI Body mass index is 29.43 kg/m. GEN: Well nourished,  well developed, in no acute distress  HEENT: normal  Neck: no JVD, carotid bruits, or masses Cardiac: RRR; no murmurs, rubs, or gallops,no edema  Respiratory:  clear to auscultation bilaterally, normal work of breathing GI: soft, nontender, nondistended, + BS MS: no deformity or atrophy  Skin: warm and dry, no rash Neuro:  Strength and sensation are intact Psych: euthymic mood, full affect   EKG:   1/19/177 sinus bradycardia at 55 bpm.  Since last tracing of 07/05/14, no significant change   Recent Labs: 07/17/2015: ALT 16; BUN 16; Creat 0.92; Potassium 3.6; Sodium 136    Lipid Panel    Component Value Date/Time   CHOL 225 (H) 07/17/2015 1418   TRIG  144 07/17/2015 1418   HDL 37 (L) 07/17/2015 1418   CHOLHDL 6.1 (H) 07/17/2015 1418   VLDL 29 07/17/2015 1418   LDLCALC 159 (H) 07/17/2015 1418   LDLDIRECT 182.5 11/01/2012 1434      Wt Readings from Last 3 Encounters:  07/16/16 211 lb (95.7 kg)  07/17/15 213 lb 1.9 oz (96.7 kg)  07/05/14 210 lb (95.3 kg)        ASSESSMENT AND PLAN:  1. ischemic heart disease status post stenting of left circumflex with a drug-eluting stent in July 2008. No recurrent angina. 2. benign hypertensive heart disease without heart failure.  3. Hypercholesterolemia. f/u labs on higher dose lipitor  Lab Results  Component Value Date   LDLCALC 159 (H) 07/17/2015    4. Diabetes mellitus. Stable. check A1c today  Lab Results  Component Value Date   HGBA1C 6.8 (H) 07/17/2015      Current medicines are reviewed at length with the patient today.  The patient does not have concerns regarding medicines.  The following changes have been made:  no change  Labs/ tests ordered today include:   Orders Placed This Encounter  Procedures  . Lipid panel  . Hepatic function panel    Disposition: Labs today f/u with me in 6 months    Charlton Haws

## 2016-07-16 ENCOUNTER — Ambulatory Visit (INDEPENDENT_AMBULATORY_CARE_PROVIDER_SITE_OTHER): Payer: Medicare Other | Admitting: Cardiovascular Disease

## 2016-07-16 ENCOUNTER — Encounter (INDEPENDENT_AMBULATORY_CARE_PROVIDER_SITE_OTHER): Payer: Self-pay

## 2016-07-16 VITALS — BP 132/80 | HR 68 | Resp 20 | Wt 211.0 lb

## 2016-07-16 DIAGNOSIS — Z79899 Other long term (current) drug therapy: Secondary | ICD-10-CM

## 2016-07-16 NOTE — Patient Instructions (Addendum)
Medication Instructions:  Your physician recommends that you continue on your current medications as directed. Please refer to the Current Medication list given to you today.  Labwork: Your physician recommends that you have lab work today, lipid and liver panel  Testing/Procedures: NONE  Follow-Up: Your physician wants you to follow-up in: 6 months with Dr. Eden EmmsNishan. You will receive a reminder letter in the mail two months in advance. If you don't receive a letter, please call our office to schedule the follow-up appointment.   If you need a refill on your cardiac medications before your next appointment, please call your pharmacy.

## 2016-07-17 LAB — HEPATIC FUNCTION PANEL
ALT: 11 IU/L (ref 0–44)
AST: 11 IU/L (ref 0–40)
Albumin: 4.1 g/dL (ref 3.5–4.8)
Alkaline Phosphatase: 85 IU/L (ref 39–117)
Bilirubin Total: 0.7 mg/dL (ref 0.0–1.2)
Bilirubin, Direct: 0.15 mg/dL (ref 0.00–0.40)
Total Protein: 7.4 g/dL (ref 6.0–8.5)

## 2016-07-17 LAB — LIPID PANEL
Chol/HDL Ratio: 5.1 ratio units — ABNORMAL HIGH (ref 0.0–5.0)
Cholesterol, Total: 183 mg/dL (ref 100–199)
HDL: 36 mg/dL — ABNORMAL LOW (ref >39)
LDL Calculated: 126 mg/dL — ABNORMAL HIGH (ref 0–99)
Triglycerides: 104 mg/dL (ref 0–149)
VLDL Cholesterol Cal: 21 mg/dL (ref 5–40)

## 2016-07-20 ENCOUNTER — Telehealth: Payer: Self-pay

## 2016-07-20 DIAGNOSIS — Z79899 Other long term (current) drug therapy: Secondary | ICD-10-CM

## 2016-07-20 MED ORDER — EZETIMIBE 10 MG PO TABS: 10.0000 mg | ORAL_TABLET | Freq: Every day | ORAL | 3 refills | Status: DC

## 2016-07-20 MED ORDER — ATORVASTATIN CALCIUM 80 MG PO TABS: ORAL_TABLET | ORAL | 3 refills | Status: DC

## 2016-07-20 NOTE — Telephone Encounter (Signed)
Called patient about lab results. Per Dr. Eden EmmsNishan, LDL is too high. Is he taking Lipitor, if so add zetia 10 mg by mouth daily. Follow-up with labs in 3 months and lipid clinic. Patient stated he has been taking his Lipitor. Sent in prescription for Zetia 10 mg by mouth daily. Patient will come in on 10/18/16 for repeat lab work. Will send message to lipid clinic about scheduling an appointment and any other advisement.

## 2016-07-20 NOTE — Telephone Encounter (Signed)
-----   Message from Wendall StadePeter C Nishan, MD sent at 07/18/2016  8:26 PM EST ----- ldl too high Is he taking lipitor if so add zetia 10 mg daily f/u labs 3 months and lipid clinic

## 2016-07-21 NOTE — Telephone Encounter (Signed)
Spoke with wife who asked I call back later to speak to him to schedule appt. She was unsure of a time he may be available.

## 2016-07-26 ENCOUNTER — Encounter: Payer: Self-pay | Admitting: Nurse Practitioner

## 2016-07-28 NOTE — Telephone Encounter (Signed)
LMOM for pt to return call to schedule appt in lipid clinic after pt has labs drawn 10/18/16.

## 2016-07-29 NOTE — Telephone Encounter (Signed)
LM with wife and instructed for him to call to make appt. Gave her direct clinic number so that appt can be made.

## 2016-08-06 ENCOUNTER — Ambulatory Visit (INDEPENDENT_AMBULATORY_CARE_PROVIDER_SITE_OTHER): Payer: Medicare Other | Admitting: Nurse Practitioner

## 2016-08-06 ENCOUNTER — Encounter: Payer: Self-pay | Admitting: Nurse Practitioner

## 2016-08-06 VITALS — BP 132/76 | HR 80 | Ht 71.0 in | Wt 210.0 lb

## 2016-08-06 DIAGNOSIS — Z1211 Encounter for screening for malignant neoplasm of colon: Secondary | ICD-10-CM

## 2016-08-06 NOTE — Progress Notes (Signed)
HPI: Patient is a 81 year old male, new to this practice,and referred by PCP, Dr. [name] ,for colon cancer screening. He has a history of CAD, status post DES in 2008. He had a recent follow up with Cardiology, no immediate concerns expressed and he was ask to follow up in 6 months. Patient has no palpitations, chest pain nor SOB. He has no GI complaints. Specifically, he has no bowel changes, blood in stool, abdominal pain, or unexplained weight loss. No FMH of colon cancer in immediate family, just a maternal aunt   Past Medical History:  Diagnosis Date  . Acute MI, subendocardial (HCC)   . Coronary artery disease   . HCD (hypertensive cardiovascular disease)   . Hyperlipidemia   . IHD (ischemic heart disease)     Past Surgical History:  Procedure Laterality Date  . CARDIAC CATHETERIZATION  01/02/2007   EF 50%  . CORONARY STENT PLACEMENT     LEFT CIRCUMFLEX   Family History  Problem Relation Age of Onset  . Healthy Mother     NEG HX  . Healthy Father     NEG HX   Social History  Substance Use Topics  . Smoking status: Former Smoker    Quit date: 02/02/1985  . Smokeless tobacco: Never Used  . Alcohol use No   Current Outpatient Prescriptions  Medication Sig Dispense Refill  . atorvastatin (LIPITOR) 80 MG tablet TAKE ONE TABLET BY MOUTH ONCE DAILY 90 tablet 3  . clopidogrel (PLAVIX) 75 MG tablet Take 1 tablet (75 mg total) by mouth daily. 30 tablet 9  . ezetimibe (ZETIA) 10 MG tablet Take 1 tablet (10 mg total) by mouth daily. 90 tablet 3  . lisinopril-hydrochlorothiazide (PRINZIDE,ZESTORETIC) 20-12.5 MG tablet TAKE ONE TABLET BY MOUTH ONCE DAILY 30 tablet 0  . metFORMIN (GLUCOPHAGE) 500 MG tablet Take 1 tablet (500 mg total) by mouth daily with breakfast. 30 tablet 1  . metoprolol succinate (TOPROL-XL) 50 MG 24 hr tablet Take 1 tablet (50 mg total) by mouth daily. Take with or immediately following a meal. 90 tablet 0  . nitroGLYCERIN (NITROSTAT) 0.4 MG SL  tablet Place 0.4 mg under the tongue every 5 (five) minutes as needed for chest pain.     No current facility-administered medications for this visit.    No Known Allergies   Review of Systems: Positive for occasional headache . All other systems reviewed and negative except where noted in HPI.    Physical Exam: BP 132/76   Pulse 80   Ht 5\' 11"  (1.803 m)   Wt 210 lb (95.3 kg)   BMI 29.29 kg/m  Constitutional:  Well-developed, black male in no acute distress. Psychiatric: Normal mood and affect. Behavior is normal. EENT:  Pupils equal. Conjunctivae are normal. No scleral icterus. Neck supple.  Cardiovascular: Normal rate, regular rhythm.  Pulmonary/chest: Effort normal and breath sounds normal. No wheezing, rales or rhonchi. Abdominal: Soft, nondistended, nontender. Bowel sounds active throughout. There are no masses palpable. No hepatomegaly. Extremities: Trace BLE Lymphadenopathy: No cervical adenopathy noted. Neurological: Alert and oriented to person place and time. Skin: Skin is warm and dry. No rashes noted.  ASSESSMENT AND PLAN:   37. 82 year old male referred for an initial colon cancer screening. He has no GI complaints / alarm symptoms. Typically colon cancer screening stop around the age of 21 but patient does appears relatively healthy for his age. Furthermore he is interested in colon cancer screening. We discussed colonoscopic evaluation as well as  Cologuard stool testing. After talking it over with his wife patient has opted for Cologuard. He understands that a positive result will warrant colonoscopy. -Will call patient with Cologuard results    2. CAD, status post stent placement 2008. Patient is on Plavix, followed by Charlton HawsPeter Nishan, MD  3. DM2, on Metformin. Most recent hemoglobin A1c was less than 7  Willette ClusterPaula Zailyn Rowser, NP  08/06/2016, 2:10 PM  Cc:  Earlie LouMohammad Garba , MD

## 2016-08-06 NOTE — Patient Instructions (Signed)
If you are age 79 or older, your body mass index should be between 23-30. Your Body mass index is 29.29 kg/m. If this is out of the aforementioned range listed, please consider follow up with your Primary Care Provider.  If you are age 79 or younger, your body mass index should be between 19-25. Your Body mass index is 29.29 kg/m. If this is out of the aformentioned range listed, please consider follow up with your Primary Care Provider.   We have sent your demographic and insurance information to [facility]. They should contact you within the next week regarding your Cologuard (colon cancer screening) test. If you have not heard from them within the next week, please call our office at [phone].  Thank you.

## 2016-08-09 NOTE — Progress Notes (Signed)
Assessment and plan as noted. As discussed with nurse practitioner who will follow-up on cologuard test

## 2016-08-30 ENCOUNTER — Other Ambulatory Visit: Payer: Self-pay

## 2016-08-30 LAB — COLOGUARD: COLOGUARD: NEGATIVE

## 2016-10-18 ENCOUNTER — Other Ambulatory Visit: Payer: Medicare Other

## 2016-10-18 ENCOUNTER — Other Ambulatory Visit: Payer: Self-pay | Admitting: Cardiovascular Disease

## 2016-10-18 DIAGNOSIS — Z79899 Other long term (current) drug therapy: Secondary | ICD-10-CM

## 2016-10-19 LAB — HEPATIC FUNCTION PANEL
ALBUMIN: 4.4 g/dL (ref 3.5–4.8)
ALT: 10 IU/L (ref 0–44)
AST: 10 IU/L (ref 0–40)
Alkaline Phosphatase: 91 IU/L (ref 39–117)
BILIRUBIN TOTAL: 0.4 mg/dL (ref 0.0–1.2)
Bilirubin, Direct: 0.11 mg/dL (ref 0.00–0.40)
TOTAL PROTEIN: 7.6 g/dL (ref 6.0–8.5)

## 2016-10-19 LAB — LIPID PANEL
Chol/HDL Ratio: 5.1 ratio — ABNORMAL HIGH (ref 0.0–5.0)
Cholesterol, Total: 202 mg/dL — ABNORMAL HIGH (ref 100–199)
HDL: 40 mg/dL (ref >39)
LDL CALC: 137 mg/dL — AB (ref 0–99)
TRIGLYCERIDES: 125 mg/dL (ref 0–149)
VLDL Cholesterol Cal: 25 mg/dL (ref 5–40)

## 2016-10-29 ENCOUNTER — Ambulatory Visit (INDEPENDENT_AMBULATORY_CARE_PROVIDER_SITE_OTHER): Payer: Medicare Other | Admitting: Pharmacist

## 2016-10-29 DIAGNOSIS — E7800 Pure hypercholesterolemia, unspecified: Secondary | ICD-10-CM

## 2016-10-29 NOTE — Progress Notes (Signed)
Patient ID: [name]                 DOB: 11-May-1936                    MRN: [id number]     HPI: [name] is a 81 y.o. male patient referred to lipid clinic by Dr Eden EmmsNishan. PMH is significant for ischemic heart disease, MI in 2008 s/p PCI with DES, HLD, HTN, and DM. He currently takes max dose Lipitor and Zetia and LDL remains above goal. He presents to lipid clinic for further management.  Pt reports adherence to Lipitor and Zetia and is tolerating them well. He does not get much exercise but has many questions regarding a healthy diet. Pt is needle-phobic and prefers to take oral medications rather than injections for his cholesterol.  Current Medications: Lipitor 80mg  daily and Zetia 10mg  daily Intolerances: none Risk Factors: CAD, MI s/p PCI with DES, HTN, DM, age LDL goal: 70mg /dL  Diet: Drinks diet soda. Is non-specific about his diet but does have a lot of questions regarding a healthy diet and what he should eat and avoid. Provided pt with nutritional handout.  Exercise: Minimal, does have a stationary exercise bike at home but doesn't use it.  Family History: Non contributory.  Social History: The patient  reports that he quit smoking about 31 years ago. He does not have any smokeless tobacco history on file. He reports that he does not drink alcohol or use drugs.   Labs: 10/18/16: TC 202, TG 125, HDL 40, LDL 137 (Lipitor 80mg  daily and Zetia 10mg  daily)  Past Medical History:  Diagnosis Date  . Acute MI, subendocardial (HCC)   . Coronary artery disease   . HCD (hypertensive cardiovascular disease)   . Hyperlipidemia   . IHD (ischemic heart disease)     Current Outpatient Prescriptions on File Prior to Visit  Medication Sig Dispense Refill  . atorvastatin (LIPITOR) 80 MG tablet TAKE ONE TABLET BY MOUTH ONCE DAILY 90 tablet 3  . clopidogrel (PLAVIX) 75 MG tablet Take 1 tablet (75 mg total) by mouth daily. 30 tablet 9  . ezetimibe (ZETIA) 10 MG tablet Take 1  tablet (10 mg total) by mouth daily. 90 tablet 3  . lisinopril-hydrochlorothiazide (PRINZIDE,ZESTORETIC) 20-12.5 MG tablet TAKE ONE TABLET BY MOUTH ONCE DAILY 30 tablet 0  . metFORMIN (GLUCOPHAGE) 500 MG tablet Take 1 tablet (500 mg total) by mouth daily with breakfast. 30 tablet 1  . metoprolol succinate (TOPROL-XL) 50 MG 24 hr tablet Take 1 tablet (50 mg total) by mouth daily. Take with or immediately following a meal. 90 tablet 0  . nitroGLYCERIN (NITROSTAT) 0.4 MG SL tablet Place 0.4 mg under the tongue every 5 (five) minutes as needed for chest pain.     No current facility-administered medications on file prior to visit.     No Known Allergies  Assessment/Plan:  1. Hyperlipidemia - LDL 137mg /dL on Lipitor 80mg  daily and Zetia 10mg  daily above goal 70mg /dL given hx of CAD s/p MI and PCI. Discussed addition of PCSK9i to help bring LDL to goal but pt is opposed to taking injectable medication for his cholesterol. He prefers to only take pills. Unfortunately, do not have other oral options to help bring his LDL to goal. The only trial pt would qualify for this year is upcoming [facility]ION study however this is investigating an injectable medication as well. Will continue max dose Lipitor and Zetia. Pt would like  to focus on lifestyle improvements. Provided pt with a nutritional handout and discussed healthy diet and activity plan extensively with pt. Will recheck lipids in 6 months.   Radie Berges E. Supple, PharmD, CPP, BCACP [facility] [address], [place], [place] [address] Phone: [phone]; Fax: [phone] 10/29/2016 8:37 AM

## 2016-10-29 NOTE — Patient Instructions (Addendum)
Continue taking your Lipitor and Zetia.  Focus on eating more lean meats (chicken, [place]urkey), vegetables, and whole grains.   Try to limit saturated fats (hamburgers, bacon, sausage, fried foods, fast food).  Try to walk and use your exercise bike.  Recheck cholesterol in 6 months. Come in Monday November 5th any time after 7:30am for fasting labs.

## 2017-05-02 ENCOUNTER — Other Ambulatory Visit: Payer: Medicare Other | Admitting: *Deleted

## 2017-05-02 DIAGNOSIS — E7800 Pure hypercholesterolemia, unspecified: Secondary | ICD-10-CM

## 2017-05-02 LAB — LIPID PANEL
CHOL/HDL RATIO: 3.9 ratio (ref 0.0–5.0)
Cholesterol, Total: 154 mg/dL (ref 100–199)
HDL: 39 mg/dL — AB (ref >39)
LDL Calculated: 101 mg/dL — ABNORMAL HIGH (ref 0–99)
Triglycerides: 72 mg/dL (ref 0–149)
VLDL CHOLESTEROL CAL: 14 mg/dL (ref 5–40)

## 2017-05-05 ENCOUNTER — Telehealth: Payer: Self-pay | Admitting: *Deleted

## 2017-05-05 MED ORDER — ROSUVASTATIN CALCIUM 20 MG PO TABS: 20.0000 mg | ORAL_TABLET | Freq: Every day | ORAL | 3 refills | Status: DC

## 2017-05-05 NOTE — Telephone Encounter (Signed)
SPOKE TO PT AND AWARE OF RESULTS   PT WAS INSTRUCTED TO STOP TAKING ATORVASTATIN  80 MG ONCE A  DAY    AND TO START TAKING CRESTOR 20 MG ONCE A DAY   RX SENT TO [facility] ON [address].

## 2017-07-26 ENCOUNTER — Other Ambulatory Visit: Payer: Self-pay | Admitting: Cardiovascular Disease

## 2017-08-09 ENCOUNTER — Ambulatory Visit (INDEPENDENT_AMBULATORY_CARE_PROVIDER_SITE_OTHER): Payer: Medicare Other | Admitting: Pharmacist

## 2017-08-09 ENCOUNTER — Encounter (INDEPENDENT_AMBULATORY_CARE_PROVIDER_SITE_OTHER): Payer: Self-pay

## 2017-08-09 VITALS — Wt 210.0 lb

## 2017-08-09 DIAGNOSIS — E7800 Pure hypercholesterolemia, unspecified: Secondary | ICD-10-CM

## 2017-08-09 MED ORDER — ROSUVASTATIN CALCIUM 40 MG PO TABS: 20.0000 mg | ORAL_TABLET | Freq: Every day | ORAL | 11 refills | Status: DC

## 2017-08-09 MED ORDER — EZETIMIBE 10 MG PO TABS: 10.0000 mg | ORAL_TABLET | Freq: Every day | ORAL | 11 refills | Status: AC

## 2017-08-09 MED ORDER — ROSUVASTATIN CALCIUM 40 MG PO TABS: 40.0000 mg | ORAL_TABLET | Freq: Every day | ORAL | 11 refills | Status: AC

## 2017-08-09 NOTE — Progress Notes (Signed)
Patient ID: Allen ForemanJohn L Shutes                 DOB: [birth year]                    MRN: [id number]     HPI: [name] is a 82 y.o. male patient referred to lipid clinic by Dr Eden EmmsNishan. PMH is significant for ischemic heart disease, MI in 2008 s/p PCI with DES, HLD, HTN, and DM. He currently takes max dose Lipitor and Zetia and LDL remains above goal. He was seen in the lipid clinic last year and did not want to take injectable medication for his cholesterol. His lipids were checked in November 2018 and LDL was 101. He was advised to stop Lipitor 80mg  daily and start Crestor 20mg  daily per Dr Eden EmmsNishan. He presents to lipid clinic for further management.  Patient presents today in good spirits. He reports tolerating his medications well. He brings in his home medications which match our medication list aside from metoprolol. He will look at home to make sure he has and is taking his metoprolol. He walks occasionally and his wife has a stationary bike at home, but he has not been using this on a regular basis. Discussed diet extensively as pt had many questions regarding healthy foods. Of note, previously discussed PCSK9i therapy with patient. He is needle-phobic and prefers to take oral medications rather than injections for his cholesterol.  Current Medications: Crestor 20mg  daily, Zetia 10mg  daily Intolerances: none Risk Factors: CAD, MI s/p PCI with DES, HTN, DM, age LDL goal: 70mg /dL  Diet: Bologna and cheese sandwich, green beans, potato salad last evening. Wife prepares most food at home. Likes ice cream, hot dogs, fish, hamburger, baked chicken. Drinks diet soda. Is non-specific about his diet but does have a lot of questions regarding a healthy diet and what he should eat and avoid. Provided pt with nutritional handout.  Exercise: Likes to walk. Does have a stationary exercise bike at home but doesn't use it.  Family History: Non contributory.  Social History: The patient  reports that he  quit smoking about 31 years ago. He does not have any smokeless tobacco history on file. He reports that he does not drink alcohol or use drugs.   Labs: 05/02/17: TC 154, TG 72, HDL 39, LDL 101 (Lipitor 80mg  daily and Zetia 10mg  daily) 10/18/16: TC 202, TG 125, HDL 40, LDL 137 (Lipitor 80mg  daily and Zetia 10mg  daily)  Past Medical History:  Diagnosis Date  . Acute MI, subendocardial (HCC)   . Coronary artery disease   . HCD (hypertensive cardiovascular disease)   . Hyperlipidemia   . IHD (ischemic heart disease)     Current Outpatient Medications on File Prior to Visit  Medication Sig Dispense Refill  . clopidogrel (PLAVIX) 75 MG tablet Take 1 tablet (75 mg total) by mouth daily. 30 tablet 9  . ezetimibe (ZETIA) 10 MG tablet TAKE ONE TABLET BY MOUTH ONCE DAILY 30 tablet 0  . lisinopril-hydrochlorothiazide (PRINZIDE,ZESTORETIC) 20-12.5 MG tablet TAKE ONE TABLET BY MOUTH ONCE DAILY 30 tablet 0  . metFORMIN (GLUCOPHAGE) 500 MG tablet Take 1 tablet (500 mg total) by mouth daily with breakfast. 30 tablet 1  . metoprolol succinate (TOPROL-XL) 50 MG 24 hr tablet Take 1 tablet (50 mg total) by mouth daily. Take with or immediately following a meal. 90 tablet 0  . nitroGLYCERIN (NITROSTAT) 0.4 MG SL tablet Place 0.4 mg under the tongue every 5 (  five) minutes as needed for chest pain.    . rosuvastatin (CRESTOR) 20 MG tablet Take 1 tablet (20 mg total) daily by mouth. 30 tablet 3   No current facility-administered medications on file prior to visit.     No Known Allergies  Assessment/Plan:  1. Hyperlipidemia - LDL 101mg /dL on Lipitor 80mg  daily and Zetia 10mg  daily above goal 70mg /dL given hx of CAD s/p MI and PCI. Statin was then changed to lower than equivalent dose of Crestor 20mg  daily. Will increase Crestor to 40mg  daily and continue Zetia 10mg  daily. Discussed addition of PCSK9i to help bring LDL to goal but pt is opposed to taking injectable medication for his cholesterol. He prefers to  only take pills. Provided pt with a nutritional handout and discussed healthy diet and activity plan extensively with pt. Encouraged him to start using his stationary bike and walking more frequently. Will recheck lipids in 3 months.   Alyssa Mancera E. Evalise Abruzzese, PharmD, CPP, BCACP [facility] [address] [place], [place] [address] Phone: [phone]; Fax: [phone] 08/09/2017 7:54 AM

## 2017-08-09 NOTE — Patient Instructions (Addendum)
Please increase your rosuvastatin (Crestor) to 40mg  once daily for your cholesterol. You can take 2 of your 20mg  tablets each day until you run out, then pick up your new prescription for the higher 40mg  dose and start taking 1 tablet daily.  Continue taking your other medications.  Look and see if you have metoprolol 50mg  once daily at home - call us if you do not.  Keep walking and using your exercise bike at home!  Follow up with cholesterol labs in 3 months. Come in on Tuesday, May 14th any time after 7:30am for fasting labs. Your goal LDL cholesterol is less than 70.

## 2017-11-08 ENCOUNTER — Other Ambulatory Visit: Payer: Medicare Other

## 2017-11-16 ENCOUNTER — Other Ambulatory Visit: Payer: Medicare Other

## 2017-11-18 ENCOUNTER — Other Ambulatory Visit: Payer: Medicare Other | Admitting: *Deleted

## 2017-11-18 DIAGNOSIS — E7800 Pure hypercholesterolemia, unspecified: Secondary | ICD-10-CM

## 2017-11-18 LAB — LIPID PANEL
Chol/HDL Ratio: 4.1 ratio (ref 0.0–5.0)
Cholesterol, Total: 149 mg/dL (ref 100–199)
HDL: 36 mg/dL — AB (ref >39)
LDL Calculated: 94 mg/dL (ref 0–99)
TRIGLYCERIDES: 94 mg/dL (ref 0–149)
VLDL Cholesterol Cal: 19 mg/dL (ref 5–40)

## 2017-11-18 LAB — LDL CHOLESTEROL, DIRECT: LDL Direct: 98 mg/dL (ref 0–99)

## 2017-11-29 ENCOUNTER — Inpatient Hospital Stay (HOSPITAL_COMMUNITY): Payer: Medicare Other

## 2017-11-29 ENCOUNTER — Observation Stay (HOSPITAL_COMMUNITY)
Admission: EM | Admit: 2017-11-29 | Discharge: 2017-12-01 | Disposition: A | Discharge status: Home / Self Care | Payer: Medicare Other | Attending: Internal Medicine | Admitting: Internal Medicine

## 2017-11-29 ENCOUNTER — Other Ambulatory Visit: Payer: Self-pay

## 2017-11-29 ENCOUNTER — Emergency Department (HOSPITAL_COMMUNITY): Payer: Medicare Other

## 2017-11-29 ENCOUNTER — Encounter (HOSPITAL_COMMUNITY): Payer: Self-pay

## 2017-11-29 DIAGNOSIS — I252 Old myocardial infarction: Secondary | ICD-10-CM | POA: Insufficient documentation

## 2017-11-29 DIAGNOSIS — E7800 Pure hypercholesterolemia, unspecified: Secondary | ICD-10-CM | POA: Insufficient documentation

## 2017-11-29 DIAGNOSIS — Z7984 Long term (current) use of oral hypoglycemic drugs: Secondary | ICD-10-CM | POA: Insufficient documentation

## 2017-11-29 DIAGNOSIS — Z7902 Long term (current) use of antithrombotics/antiplatelets: Secondary | ICD-10-CM | POA: Insufficient documentation

## 2017-11-29 DIAGNOSIS — I259 Chronic ischemic heart disease, unspecified: Secondary | ICD-10-CM | POA: Insufficient documentation

## 2017-11-29 DIAGNOSIS — E119 Type 2 diabetes mellitus without complications: Secondary | ICD-10-CM | POA: Insufficient documentation

## 2017-11-29 DIAGNOSIS — N390 Urinary tract infection, site not specified: Secondary | ICD-10-CM | POA: Insufficient documentation

## 2017-11-29 DIAGNOSIS — E876 Hypokalemia: Secondary | ICD-10-CM | POA: Diagnosis not present

## 2017-11-29 DIAGNOSIS — I10 Essential (primary) hypertension: Secondary | ICD-10-CM | POA: Diagnosis not present

## 2017-11-29 DIAGNOSIS — N179 Acute kidney failure, unspecified: Secondary | ICD-10-CM | POA: Diagnosis present

## 2017-11-29 DIAGNOSIS — Z79899 Other long term (current) drug therapy: Secondary | ICD-10-CM | POA: Diagnosis not present

## 2017-11-29 DIAGNOSIS — I2510 Atherosclerotic heart disease of native coronary artery without angina pectoris: Secondary | ICD-10-CM | POA: Insufficient documentation

## 2017-11-29 DIAGNOSIS — G9341 Metabolic encephalopathy: Secondary | ICD-10-CM | POA: Diagnosis not present

## 2017-11-29 DIAGNOSIS — Z955 Presence of coronary angioplasty implant and graft: Secondary | ICD-10-CM | POA: Insufficient documentation

## 2017-11-29 LAB — CBC
HEMATOCRIT: 37.1 % — AB (ref 39.0–52.0)
Hemoglobin: 12 g/dL — ABNORMAL LOW (ref 13.0–17.0)
MCH: 26.8 pg (ref 26.0–34.0)
MCHC: 32.3 g/dL (ref 30.0–36.0)
MCV: 82.8 fL (ref 78.0–100.0)
Platelets: 199 10*3/uL (ref 150–400)
RBC: 4.48 MIL/uL (ref 4.22–5.81)
RDW: 14.1 % (ref 11.5–15.5)
WBC: 8.7 10*3/uL (ref 4.0–10.5)

## 2017-11-29 LAB — URINALYSIS, ROUTINE W REFLEX MICROSCOPIC
BILIRUBIN URINE: NEGATIVE
GLUCOSE, UA: NEGATIVE mg/dL
KETONES UR: NEGATIVE mg/dL
NITRITE: POSITIVE — AB
PH: 5 (ref 5.0–8.0)
Protein, ur: 30 mg/dL — AB
Specific Gravity, Urine: 1.015 (ref 1.005–1.030)

## 2017-11-29 LAB — BASIC METABOLIC PANEL
Anion gap: 10 (ref 5–15)
BUN: 30 mg/dL — ABNORMAL HIGH (ref 6–20)
CHLORIDE: 99 mmol/L — AB (ref 101–111)
CO2: 30 mmol/L (ref 22–32)
Calcium: 9.6 mg/dL (ref 8.9–10.3)
Creatinine, Ser: 1.8 mg/dL — ABNORMAL HIGH (ref 0.61–1.24)
GFR calc Af Amer: 40 mL/min — ABNORMAL LOW (ref >60)
GFR, EST NON AFRICAN AMERICAN: 34 mL/min — AB (ref >60)
Glucose, Bld: 140 mg/dL — ABNORMAL HIGH (ref 65–99)
POTASSIUM: 2.9 mmol/L — AB (ref 3.5–5.1)
SODIUM: 139 mmol/L (ref 135–145)

## 2017-11-29 LAB — I-STAT TROPONIN, ED: Troponin i, poc: 0 ng/mL (ref 0.00–0.08)

## 2017-11-29 LAB — CBG MONITORING, ED: Glucose-Capillary: 122 mg/dL — ABNORMAL HIGH (ref 65–99)

## 2017-11-29 MED ORDER — CLOPIDOGREL BISULFATE 75 MG PO TABS
75.0000 mg | ORAL_TABLET | Freq: Every day | ORAL | Status: DC
  Administered 2017-11-30 – 2017-12-01 (×2): 75 mg via ORAL
  Filled 2017-11-29 (×2): qty 1

## 2017-11-29 MED ORDER — SODIUM CHLORIDE 0.9 % IV BOLUS
1000.0000 mL | Freq: Once | INTRAVENOUS | Status: AC
  Administered 2017-11-29: 1000 mL via INTRAVENOUS

## 2017-11-29 MED ORDER — EZETIMIBE 10 MG PO TABS
10.0000 mg | ORAL_TABLET | Freq: Every day | ORAL | Status: DC
  Administered 2017-11-30 – 2017-12-01 (×2): 10 mg via ORAL
  Filled 2017-11-29 (×2): qty 1

## 2017-11-29 MED ORDER — MAGNESIUM CITRATE PO SOLN: 1.0000 | Freq: Once | ORAL | Status: DC | PRN

## 2017-11-29 MED ORDER — BISACODYL 5 MG PO TBEC: 5.0000 mg | DELAYED_RELEASE_TABLET | Freq: Every day | ORAL | Status: DC | PRN

## 2017-11-29 MED ORDER — INSULIN ASPART 100 UNIT/ML ~~LOC~~ SOLN
0.0000 [IU] | Freq: Three times a day (TID) | SUBCUTANEOUS | Status: DC
  Administered 2017-11-30 (×2): 2 [IU] via SUBCUTANEOUS

## 2017-11-29 MED ORDER — ALBUTEROL SULFATE (2.5 MG/3ML) 0.083% IN NEBU: 2.5000 mg | INHALATION_SOLUTION | Freq: Four times a day (QID) | RESPIRATORY_TRACT | Status: DC | PRN

## 2017-11-29 MED ORDER — ONDANSETRON HCL 4 MG PO TABS: 4.0000 mg | ORAL_TABLET | Freq: Four times a day (QID) | ORAL | Status: DC | PRN

## 2017-11-29 MED ORDER — SENNOSIDES-DOCUSATE SODIUM 8.6-50 MG PO TABS: 1.0000 | ORAL_TABLET | Freq: Every evening | ORAL | Status: DC | PRN

## 2017-11-29 MED ORDER — ONDANSETRON HCL 4 MG/2ML IJ SOLN: 4.0000 mg | Freq: Four times a day (QID) | INTRAMUSCULAR | Status: DC | PRN

## 2017-11-29 MED ORDER — INSULIN ASPART 100 UNIT/ML ~~LOC~~ SOLN: 0.0000 [IU] | Freq: Every day | SUBCUTANEOUS | Status: DC

## 2017-11-29 MED ORDER — ACETAMINOPHEN 650 MG RE SUPP
650.0000 mg | Freq: Four times a day (QID) | RECTAL | Status: DC | PRN
Start: 2017-11-29 — End: 2017-12-01

## 2017-11-29 MED ORDER — SODIUM CHLORIDE 0.9 % IV SOLN
1.0000 g | Freq: Once | INTRAVENOUS | Status: AC
  Administered 2017-11-29: 1 g via INTRAVENOUS
  Filled 2017-11-29: qty 10

## 2017-11-29 MED ORDER — POTASSIUM CHLORIDE CRYS ER 20 MEQ PO TBCR
40.0000 meq | EXTENDED_RELEASE_TABLET | Freq: Once | ORAL | Status: AC
  Administered 2017-11-29: 40 meq via ORAL
  Filled 2017-11-29: qty 2

## 2017-11-29 MED ORDER — ROSUVASTATIN CALCIUM 20 MG PO TABS
40.0000 mg | ORAL_TABLET | Freq: Every day | ORAL | Status: DC
  Administered 2017-11-30 – 2017-12-01 (×2): 40 mg via ORAL
  Filled 2017-11-29 (×2): qty 2

## 2017-11-29 MED ORDER — SODIUM CHLORIDE 0.9 % IV SOLN
INTRAVENOUS | Status: DC
Start: 2017-11-29 — End: 2017-12-01
  Administered 2017-11-29 – 2017-11-30 (×3): via INTRAVENOUS

## 2017-11-29 MED ORDER — NITROGLYCERIN 0.4 MG SL SUBL: 0.4000 mg | SUBLINGUAL_TABLET | SUBLINGUAL | Status: DC | PRN

## 2017-11-29 MED ORDER — ACETAMINOPHEN 325 MG PO TABS: 650.0000 mg | ORAL_TABLET | Freq: Four times a day (QID) | ORAL | Status: DC | PRN

## 2017-11-29 MED ORDER — IPRATROPIUM BROMIDE 0.02 % IN SOLN: 0.5000 mg | Freq: Four times a day (QID) | RESPIRATORY_TRACT | Status: DC | PRN

## 2017-11-29 MED ORDER — ENOXAPARIN SODIUM 40 MG/0.4ML ~~LOC~~ SOLN
40.0000 mg | SUBCUTANEOUS | Status: DC
  Administered 2017-11-29 – 2017-11-30 (×2): 40 mg via SUBCUTANEOUS
  Filled 2017-11-29 (×2): qty 0.4

## 2017-11-29 MED ORDER — METOPROLOL SUCCINATE ER 50 MG PO TB24
50.0000 mg | ORAL_TABLET | Freq: Every day | ORAL | Status: DC
  Administered 2017-11-30 – 2017-12-01 (×2): 50 mg via ORAL
  Filled 2017-11-29 (×2): qty 1

## 2017-11-29 MED ORDER — SODIUM CHLORIDE 0.9 % IV SOLN
1.0000 g | INTRAVENOUS | Status: DC
  Administered 2017-11-30: 1 g via INTRAVENOUS
  Filled 2017-11-29: qty 10
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
## 2017-11-29 NOTE — ED Notes (Signed)
Pt informed to provide a UA when he needs to use the restroom next. Pt verbalized understanding.
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
## 2017-11-29 NOTE — ED Triage Notes (Signed)
Pt from home with ems for syncopal episode. Pt was found by family sitting in chair on porch unconscious. Pt reports he was just taking a nap but family states his eyes were "rolled to the back of his head" and would not respond. Pt c.o gen fatigue at this time. Positive orthostatic changes with ems, BP went from 108/62 to 94/50 while standing and HR 74 to 148bpm standing. Pt given bolus en route. 18G LAC. Pt a.o upon arrival to ED, nad

## 2017-11-29 NOTE — Progress Notes (Signed)
Called ER RN for report. Room ready.  

## 2017-11-30 DIAGNOSIS — N179 Acute kidney failure, unspecified: Secondary | ICD-10-CM | POA: Diagnosis not present

## 2017-11-30 DIAGNOSIS — I10 Essential (primary) hypertension: Secondary | ICD-10-CM | POA: Diagnosis not present

## 2017-11-30 DIAGNOSIS — N183 Chronic kidney disease, stage 3 (moderate): Secondary | ICD-10-CM | POA: Diagnosis not present

## 2017-11-30 DIAGNOSIS — G9341 Metabolic encephalopathy: Secondary | ICD-10-CM | POA: Diagnosis present

## 2017-11-30 DIAGNOSIS — E1122 Type 2 diabetes mellitus with diabetic chronic kidney disease: Secondary | ICD-10-CM | POA: Diagnosis not present

## 2017-11-30 DIAGNOSIS — N390 Urinary tract infection, site not specified: Secondary | ICD-10-CM | POA: Diagnosis not present

## 2017-11-30 LAB — CBC
HCT: 35.3 % — ABNORMAL LOW (ref 39.0–52.0)
HEMOGLOBIN: 11.4 g/dL — AB (ref 13.0–17.0)
MCH: 26.8 pg (ref 26.0–34.0)
MCHC: 32.3 g/dL (ref 30.0–36.0)
MCV: 82.9 fL (ref 78.0–100.0)
PLATELETS: 209 10*3/uL (ref 150–400)
RBC: 4.26 MIL/uL (ref 4.22–5.81)
RDW: 14.3 % (ref 11.5–15.5)
WBC: 7.7 10*3/uL (ref 4.0–10.5)

## 2017-11-30 LAB — GLUCOSE, CAPILLARY
GLUCOSE-CAPILLARY: 121 mg/dL — AB (ref 65–99)
GLUCOSE-CAPILLARY: 132 mg/dL — AB (ref 65–99)
Glucose-Capillary: 118 mg/dL — ABNORMAL HIGH (ref 65–99)
Glucose-Capillary: 126 mg/dL — ABNORMAL HIGH (ref 65–99)
Glucose-Capillary: 116 mg/dL — ABNORMAL HIGH (ref 65–99)

## 2017-11-30 LAB — BASIC METABOLIC PANEL
ANION GAP: 5 (ref 5–15)
BUN: 20 mg/dL (ref 6–20)
CALCIUM: 8.9 mg/dL (ref 8.9–10.3)
CO2: 28 mmol/L (ref 22–32)
CREATININE: 1.53 mg/dL — AB (ref 0.61–1.24)
Chloride: 106 mmol/L (ref 101–111)
GFR calc non Af Amer: 42 mL/min — ABNORMAL LOW (ref >60)
GFR, EST AFRICAN AMERICAN: 48 mL/min — AB (ref >60)
Glucose, Bld: 121 mg/dL — ABNORMAL HIGH (ref 65–99)
Potassium: 3.2 mmol/L — ABNORMAL LOW (ref 3.5–5.1)
SODIUM: 139 mmol/L (ref 135–145)

## 2017-11-30 LAB — MAGNESIUM: Magnesium: 2.2 mg/dL (ref 1.7–2.4)

## 2017-11-30 LAB — PHOSPHORUS: Phosphorus: 1.9 mg/dL — ABNORMAL LOW (ref 2.5–4.6)

## 2017-11-30 MED ORDER — POTASSIUM CHLORIDE CRYS ER 20 MEQ PO TBCR
40.0000 meq | EXTENDED_RELEASE_TABLET | Freq: Once | ORAL | Status: AC
  Administered 2017-11-30: 40 meq via ORAL
  Filled 2017-11-30: qty 2

## 2017-11-30 NOTE — Care Management Note (Signed)
Case Management Note  Patient Details  Name: Allen ForemanJohn L Buck MRN: [id number] Date of Birth: 19-Sep-1935  Subjective/Objective:                 Patient admitted form home, admitted after being found unresponsive on front porch swing. Dehydration, AKI, UTI treating w IVF IV Abx. Cleared by PT. No CM needs identified at this time.    Action/Plan:   Expected Discharge Date:                  Expected Discharge Plan:  Home/Self Care  In-House Referral:     Discharge planning Services  CM Consult  Post Acute Care Choice:    Choice offered to:     DME Arranged:    DME Agency:     HH Arranged:    HH Agency:     Status of Service:  In process, will continue to follow  If discussed at Long Length of Stay Meetings, dates discussed:    Additional Comments:  Lawerance SabalDebbie Jadence Kinlaw, RN 11/30/2017, 2:18 PM

## 2017-11-30 NOTE — Progress Notes (Signed)
PROGRESS NOTE    [name]  ION:[id number] DOB: 07-24-1935 DOA: 11/29/2017 PCP: Rometta EmeryGarba, Mohammad L, MD    Brief Narrative:  Patient is a 82 year old male with history of CAD, MI, HTN, hyperlipidemia, and DM Type 2 admitted to the hospital 11/29/17 for generalized weakness and AKI. Upon admission BP: 123/78, P: 65, T: 97.6 F, Resp: 14, SpO2 99%.  His creatinine in the ED was 1.8, which was elevated from .90 baseline. Today his creatinine is 1.53. UA findings were indicative of UTI, Rocephin was started and cultures are pending.    Assessment & Plan:   Active Problems:   Acute kidney injury (HCC)   1. AKI -Likely pre renal as patient was orthostatic, had associated weakness, possible syncope, and reported eating/drinking less prior to admission and being out in the sun.  -Creatinine is trending down 1.8 to 1.53, continue to monitor with BMP tomorrow morning -Renal US demonstrated 5mm nonobstructive right renal calculus, follow up with urology outpatient -Continue .9% sodium chloride infusion  2. UTI -UA indicative of bacterial infection, continue Rocephin -Urine cultures pending  3. Hypokalemia -Potassium increased from 2.9 to 3.2 but still low. Give one time dose KCl 40mEq PO -Reassess tomorrow with BMP  4. Type II DM -Continue Novolog -Continue to monitor glucose, patient stable in the 120s  5. CAD -stable, continue to monitor -Continue Plavix 75mg  qd  6. Hyperlipidemia -Conitnue Zetia 10mg  and Crestor 40mg    DVT prophylaxis: Lovenox  Code Status: Full Code Family Communication: Son, in room Disposition Plan: Home when improved   Consultants:   Physical therapy  Procedures:     Antimicrobials:   Rocephin   Subjective: Patient reports feeling better today than yesterday. He is no longer feeling weak and has regained his appetite. He reports no difficulty with urination, painful urination, or dark colored urine. Patient denies abdominal pain, back pain.     Objective: Vitals:   11/29/17 1900 11/29/17 2004 11/30/17 0453 11/30/17 0815  BP: 136/75 (!) 158/86 (!) 103/57 (!) 119/54  Pulse: 87 86 86 87  Resp: 17 18 17 16   Temp:  98.9 F (37.2 C) 99.1 F (37.3 C) 98.4 F (36.9 C)  TempSrc:  Oral Oral Oral  SpO2: 98% 100% 98% 98%    Intake/Output Summary (Last 24 hours) at 11/30/2017 1226 Last data filed at 11/30/2017 0815 Gross per 24 hour  Intake 2325 ml  Output 425 ml  Net 1900 ml   There were no vitals filed for this visit.  Examination:   General: Not in pain or dyspnea Neurology: Awake and alert, non focal  E ENT: no pallor, no icterus, oral mucosa moist Cardiovascular: No JVD. S1-S2 present, rhythmic, no gallops, rubs, or murmurs. No lower extremity edema. Pulmonary: vesicular breath sounds bilaterally, adequate air movement, no wheezing, rhonchi or rales. Gastrointestinal. Abdomen flat, no organomegaly, non tender, no rebound or guarding Skin. No rashes Musculoskeletal: no joint deformities     Data Reviewed: I have personally reviewed following labs and imaging studies  CBC: Recent Labs  Lab 11/29/17 1409 11/30/17 0437  WBC 8.7 7.7  HGB 12.0* 11.4*  HCT 37.1* 35.3*  MCV 82.8 82.9  PLT 199 209   Basic Metabolic Panel: Recent Labs  Lab 11/29/17 1409 11/30/17 0437  NA 139 139  K 2.9* 3.2*  CL 99* 106  CO2 30 28  GLUCOSE 140* 121*  BUN 30* 20  CREATININE 1.80* 1.53*  CALCIUM 9.6 8.9  MG  --  2.2  PHOS  --  1.9*   GFR: CrCl cannot be calculated (Unknown ideal weight.). Liver Function Tests: No results for input(s): AST, ALT, ALKPHOS, BILITOT, PROT, ALBUMIN in the last 168 hours. No results for input(s): LIPASE, AMYLASE in the last 168 hours. No results for input(s): AMMONIA in the last 168 hours. Coagulation Profile: No results for input(s): INR, PROTIME in the last 168 hours. Cardiac Enzymes: No results for input(s): CKTOTAL, CKMB, CKMBINDEX, TROPONINI in the last 168 hours. BNP (last 3  results) No results for input(s): PROBNP in the last 8760 hours. HbA1C: No results for input(s): HGBA1C in the last 72 hours. CBG: Recent Labs  Lab 11/29/17 1415 11/29/17 2001 11/30/17 0814 11/30/17 1137  GLUCAP 122* 118* 126* 121*   Lipid Profile: No results for input(s): CHOL, HDL, LDLCALC, TRIG, CHOLHDL, LDLDIRECT in the last 72 hours. Thyroid Function Tests: No results for input(s): TSH, T4TOTAL, FREET4, T3FREE, THYROIDAB in the last 72 hours. Anemia Panel: No results for input(s): VITAMINB12, FOLATE, FERRITIN, TIBC, IRON, RETICCTPCT in the last 72 hours.    Radiology Studies: I have reviewed all of the imaging during this hospital visit personally     Scheduled Meds: . clopidogrel  75 mg Oral Daily  . enoxaparin (LOVENOX) injection  40 mg Subcutaneous Q24H  . ezetimibe  10 mg Oral Daily  . insulin aspart  0-15 Units Subcutaneous TID WC  . insulin aspart  0-5 Units Subcutaneous QHS  . metoprolol succinate  50 mg Oral Daily  . rosuvastatin  40 mg Oral Daily   Continuous Infusions: . sodium chloride 75 mL/hr at 11/30/17 1017  . cefTRIAXone (ROCEPHIN)  IV       LOS: 1 day     Cheri Kearns, Claris Gladden   , MD [facility] Pager [phone]

## 2017-11-30 NOTE — Care Management CC44 (Signed)
Condition Code 44 Documentation Completed  Patient Details  Name: Allen ForemanJohn L Buck MRN: [id number] Date of Birth: Jan 06, 1936   Condition Code 44 given:  Yes Patient signature on Condition Code 44 notice:  Yes Documentation of 2 MD's agreement:  Yes Code 44 added to claim:  Yes    Lawerance Sabalebbie Marchia Diguglielmo, RN 11/30/2017, 4:51 PM

## 2017-11-30 NOTE — Evaluation (Signed)
Physical Therapy Evaluation Patient Details Name: [name] MRN: [id number] DOB: 08-05-1935 Today's Date: 11/30/2017   History of Present Illness   [name] is a 82 y.o. male with a known history of  CAD status post MI, hypertension, hyperlipidemia, type 2 diabetes presents to the emergency department for evaluation of generalized weakness. UTI with acute kidney injury  Clinical Impression   Pt admitted with above diagnosis. Pt currently with functional limitations due to the deficits listed below (see PT Problem List). Overall [name] is moving well with some noted gait deviations; Will plan to see likely one more session to consider use of a cane and for stair training;  Pt will benefit from skilled PT to increase their independence and safety with mobility to allow discharge to the venue listed below.       Follow Up Recommendations No PT follow up    Equipment Recommendations  Cane(Will consider a cane)    Recommendations for Other Services       Precautions / Restrictions Precautions Precautions: None Restrictions Weight Bearing Restrictions: No      Mobility  Bed Mobility Overal bed mobility: Independent                Transfers Overall transfer level: Needs assistance Equipment used: None Transfers: Sit to/from Stand Sit to Stand: Supervision         General transfer comment: Cues to self-monitor for activity tolerance  Ambulation/Gait Ambulation/Gait assistance: Min guard;Supervision Ambulation Distance (Feet): 300 Feet Assistive device: None;IV Pole Gait Pattern/deviations: Step-through pattern;Decreased step length - right;Decreased step length - left;Decreased stride length     General Gait Details: Minguard assist progressing to Supervision; initially slower, but improved as he walked; walked without Assistive Device and pushing IV pole  Stairs            Wheelchair Mobility    Modified Rankin (Stroke Patients Only)        Balance                                             Pertinent Vitals/Pain Pain Assessment: No/denies pain    Home Living Family/patient expects to be discharged to:: Private residence Living Arrangements: Spouse/significant other Available Help at Discharge: Family;Available PRN/intermittently Type of Home: House       Home Layout: Multi-level;Laundry or work area in basement        Prior Function Level of Independence: Independent         Comments: Drives; 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     Hand Dominance        Extremity/Trunk Assessment   Upper Extremity Assessment Upper Extremity Assessment: Overall WFL for tasks assessed    Lower Extremity Assessment Lower Extremity Assessment: Generalized weakness       Communication   Communication: No difficulties  Cognition Arousal/Alertness: Awake/alert Behavior During Therapy: WFL for tasks assessed/performed Overall Cognitive Status: Within Functional Limits for tasks assessed                                        General Comments      Exercises     Assessment/Plan    PT Assessment Patient needs continued PT services  PT Problem List Decreased strength;Decreased activity tolerance;Decreased balance  PT Treatment Interventions Gait training;DME instruction;Stair training;Functional mobility training;Therapeutic activities;Therapeutic exercise;Balance training;Patient/family education    PT Goals (Current goals can be found in the Care Plan section)  Acute Rehab PT Goals Patient Stated Goal: home soon PT Goal Formulation: With patient Time For Goal Achievement: 12/14/17 Potential to Achieve Goals: Good    Frequency Min 3X/week   Barriers to discharge        Co-evaluation               AM-PAC PT "6 Clicks" Daily Activity  Outcome Measure Difficulty turning over in bed (including adjusting bedclothes, sheets and blankets)?:  None Difficulty moving from lying on back to sitting on the side of the bed? : None Difficulty sitting down on and standing up from a chair with arms (e.g., wheelchair, bedside commode, etc,.)?: None Help needed moving to and from a bed to chair (including a wheelchair)?: None Help needed walking in hospital room?: A Little Help needed climbing 3-5 steps with a railing? : A Little 6 Click Score: 22    End of Session Equipment Utilized During Treatment: Gait belt Activity Tolerance: Patient tolerated treatment well Patient left: in chair;with call bell/phone within reach;with family/visitor present Nurse Communication: Mobility status PT Visit Diagnosis: Other abnormalities of gait and mobility (R26.89);Muscle weakness (generalized) (M62.81)    Time: 4098-1191 PT Time Calculation (min) (ACUTE ONLY): 19 min   Charges:   PT Evaluation $PT Eval Low Complexity: 1 Low     PT G Codes:        Van Clines, PT  Acute Rehabilitation Services Pager [phone] Office [phone]'  Levi Aland 11/30/2017, 10:49 AM

## 2017-11-30 NOTE — Progress Notes (Signed)
PROGRESS NOTE    [name]  ZOX:[id number] DOB: Dec 19, 1935 DOA: 11/29/2017 PCP: Rometta Emery, MD    Brief Narrative:  82 year old male who presented with altered mentation.  He does have significant past history for coronary artery disease, hypertension, dyslipidemia and type 2 diabetes mellitus.  Patient was found unresponsive, patient did have loss of appetite over the last few days prior to hospitalization.  On the initial physical examination blood pressure 125/70, heart rate 60, respirate 19, oxygen saturation 99%.  Dry mucous membranes, lungs clear to auscultation bilaterally, heart S1-S2 present rhythmic, abdomen soft, nontender, nondistended, no lower extremity edema.  Sodium 139, potassium 2.9, chloride 99, bicarb 30, glucose 140, BUN 30, creatinine 1.8, white count 8.7, hemoglobin 12.0 hematocrit 37.1, platelets 199.  Urinalysis 30 protein, specific gravity 1.015, white cells 11-20.  Chest x-ray negative for infiltrates.  EKG normal sinus rhythm, normal axis, normal intervals.  Patient was admitted to the hospital working diagnosis of metabolic encephalopathy complicated by acute kidney injury, hypokalemia and urinary tract infection.  Assessment & Plan:   Active Problems:   Acute kidney injury (HCC)   1. Metabolic encephalopathy. Will continue neuro checks per unit protocol, patient is more awake and alert, no further confusion or agitation.   2. AKI with hypokalemia. Continue IV fluids, at current rate, continue K repletion with po kcl, will follow on renal panel in am, avoid hypotension or nephrotoxic medications.   3. Urinary tract infection. Continue IV ceftriaxone, follow on cultures, cell count and temperature curve.   4. HTN. Blood pressure controlled, will continue metoprolol.   5. CAD with dyslipidemia. Patient is chest pain free, continue clopidogrel, zetia and crestor.   6. T2DM. Continue glucose cover and monitoring with insulin sliding scale. Patient  tolerating po well.   DVT prophylaxis:. Enoxaparin   Code Status: full Family Communication: I spoke with patient's son at the bedside and all questions were addressed.   Disposition Plan: home in am   Consultants:     Procedures:     Antimicrobials:       Subjective: Patient is feeling better, no nausea or vomiting, no chest pain or dyspnea, has been ambulating with physical therapy.   Objective: Vitals:   11/29/17 1900 11/29/17 2004 11/30/17 0453 11/30/17 0815  BP: 136/75 (!) 158/86 (!) 103/57 (!) 119/54  Pulse: 87 86 86 87  Resp: 17 18 17 16   Temp:  98.9 F (37.2 C) 99.1 F (37.3 C) 98.4 F (36.9 C)  TempSrc:  Oral Oral Oral  SpO2: 98% 100% 98% 98%    Intake/Output Summary (Last 24 hours) at 11/30/2017 1554 Last data filed at 11/30/2017 0815 Gross per 24 hour  Intake 1825 ml  Output 425 ml  Net 1400 ml   There were no vitals filed for this visit.  Examination:   General: Not in pain or dyspnea, deconditioned  Neurology: Awake and alert, non focal  E ENT: mild pallor, no icterus, oral mucosa moist Cardiovascular: No JVD. S1-S2 present, rhythmic, no gallops, rubs, or murmurs. No lower extremity edema. Pulmonary: vesicular breath sounds bilaterally, adequate air movement, no wheezing, rhonchi or rales. Gastrointestinal. Abdomen with no organomegaly, non tender, no rebound or guarding Skin. No rashes Musculoskeletal: no joint deformities     Data Reviewed: I have personally reviewed following labs and imaging studies  CBC: Recent Labs  Lab 11/29/17 1409 11/30/17 0437  WBC 8.7 7.7  HGB 12.0* 11.4*  HCT 37.1* 35.3*  MCV 82.8 82.9  PLT 199  209   Basic Metabolic Panel: Recent Labs  Lab 11/29/17 1409 11/30/17 0437  NA 139 139  K 2.9* 3.2*  CL 99* 106  CO2 30 28  GLUCOSE 140* 121*  BUN 30* 20  CREATININE 1.80* 1.53*  CALCIUM 9.6 8.9  MG  --  2.2  PHOS  --  1.9*   GFR: CrCl cannot be calculated (Unknown ideal weight.). Liver Function  Tests: No results for input(s): AST, ALT, ALKPHOS, BILITOT, PROT, ALBUMIN in the last 168 hours. No results for input(s): LIPASE, AMYLASE in the last 168 hours. No results for input(s): AMMONIA in the last 168 hours. Coagulation Profile: No results for input(s): INR, PROTIME in the last 168 hours. Cardiac Enzymes: No results for input(s): CKTOTAL, CKMB, CKMBINDEX, TROPONINI in the last 168 hours. BNP (last 3 results) No results for input(s): PROBNP in the last 8760 hours. HbA1C: No results for input(s): HGBA1C in the last 72 hours. CBG: Recent Labs  Lab 11/29/17 1415 11/29/17 2001 11/30/17 0814 11/30/17 1137  GLUCAP 122* 118* 126* 121*   Lipid Profile: No results for input(s): CHOL, HDL, LDLCALC, TRIG, CHOLHDL, LDLDIRECT in the last 72 hours. Thyroid Function Tests: No results for input(s): TSH, T4TOTAL, FREET4, T3FREE, THYROIDAB in the last 72 hours. Anemia Panel: No results for input(s): VITAMINB12, FOLATE, FERRITIN, TIBC, IRON, RETICCTPCT in the last 72 hours.    Radiology Studies: I have reviewed all of the imaging during this hospital visit personally     Scheduled Meds: . clopidogrel  75 mg Oral Daily  . enoxaparin (LOVENOX) injection  40 mg Subcutaneous Q24H  . ezetimibe  10 mg Oral Daily  . insulin aspart  0-15 Units Subcutaneous TID WC  . insulin aspart  0-5 Units Subcutaneous QHS  . metoprolol succinate  50 mg Oral Daily  . rosuvastatin  40 mg Oral Daily   Continuous Infusions: . sodium chloride 75 mL/hr at 11/30/17 1017  . cefTRIAXone (ROCEPHIN)  IV       LOS: 1 day        Coralie KeensMauricio Daniel Arrien, MD [facility] Pager [phone]

## 2017-11-30 NOTE — Care Management Obs Status (Signed)
MEDICARE OBSERVATION STATUS NOTIFICATION   Patient Details  Name: Allen ForemanJohn L Rollison MRN: [id number] Date of Birth: [birth year]   Medicare Observation Status Notification Given:  Yes    Lawerance Sabalebbie Naziyah Tieszen, RN 11/30/2017, 4:50 PM

## 2017-12-01 DIAGNOSIS — N390 Urinary tract infection, site not specified: Secondary | ICD-10-CM | POA: Diagnosis not present

## 2017-12-01 DIAGNOSIS — G9341 Metabolic encephalopathy: Secondary | ICD-10-CM | POA: Diagnosis not present

## 2017-12-01 DIAGNOSIS — I10 Essential (primary) hypertension: Secondary | ICD-10-CM | POA: Diagnosis not present

## 2017-12-01 DIAGNOSIS — E1122 Type 2 diabetes mellitus with diabetic chronic kidney disease: Secondary | ICD-10-CM

## 2017-12-01 DIAGNOSIS — N179 Acute kidney failure, unspecified: Secondary | ICD-10-CM | POA: Diagnosis not present

## 2017-12-01 DIAGNOSIS — N183 Chronic kidney disease, stage 3 (moderate): Secondary | ICD-10-CM

## 2017-12-01 LAB — BASIC METABOLIC PANEL
ANION GAP: 11 (ref 5–15)
BUN: 18 mg/dL (ref 6–20)
CALCIUM: 8.8 mg/dL — AB (ref 8.9–10.3)
CO2: 22 mmol/L (ref 22–32)
CREATININE: 1.61 mg/dL — AB (ref 0.61–1.24)
Chloride: 106 mmol/L (ref 101–111)
GFR, EST AFRICAN AMERICAN: 45 mL/min — AB (ref >60)
GFR, EST NON AFRICAN AMERICAN: 39 mL/min — AB (ref >60)
Glucose, Bld: 150 mg/dL — ABNORMAL HIGH (ref 65–99)
Potassium: 4.7 mmol/L (ref 3.5–5.1)
SODIUM: 139 mmol/L (ref 135–145)

## 2017-12-01 LAB — GLUCOSE, CAPILLARY
GLUCOSE-CAPILLARY: 108 mg/dL — AB (ref 65–99)
GLUCOSE-CAPILLARY: 122 mg/dL — AB (ref 65–99)

## 2017-12-01 MED ORDER — CEPHALEXIN 500 MG PO CAPS
500.0000 mg | ORAL_CAPSULE | Freq: Two times a day (BID) | ORAL | Status: DC
  Administered 2017-12-01: 500 mg via ORAL
  Filled 2017-12-01: qty 1

## 2017-12-01 MED ORDER — AMLODIPINE BESYLATE 5 MG PO TABS: 5.0000 mg | ORAL_TABLET | Freq: Every day | ORAL | 0 refills | Status: DC

## 2017-12-01 MED ORDER — CEPHALEXIN 500 MG PO CAPS: 500.0000 mg | ORAL_CAPSULE | Freq: Two times a day (BID) | ORAL | 0 refills | Status: AC

## 2017-12-01 NOTE — Discharge Summary (Addendum)
Physician Discharge Summary  [name] RUE:[id number] DOB: Nov 20, 1935 DOA: 11/29/2017  PCP: Rometta Emery, MD  Admit date: 11/29/2017 Discharge date: 12/01/2017  Admitted From: Home  Disposition:  Home   Recommendations for Outpatient Follow-up and new medication changes:  1. Follow up with Dr. [name] in 1- weeks 2. Please obtain BMP in one week 3. HCTZ and lisinopril have been held due to AKI 4. Patient placed on low dose amlodipine 5. To consider resume ace inh when GFR becomes more stable  Home Health: no   Equipment/Devices: no    Discharge Condition: stable  CODE STATUS: full  Diet recommendation: Heart healthy and diabetic prudent  Brief/Interim Summary: 82 year old male who presented with altered mentation.  He does have the significant past medical history for coronary artery disease, hypertension, dyslipidemia and type 2 diabetes mellitus.  Patient was found unresponsive, patient did have loss of appetite over the last few days prior to hospitalization.  On the initial physical examination blood pressure 125/70, heart rate 60, respiratory rate 19, oxygen saturation 99%.  Dry mucous membranes, lungs clear to auscultation bilaterally, heart S1-S2 present and rhythmic, abdomen soft, nontender, nondistended, no lower extremity edema.  Sodium 139, potassium 2.9, chloride 99, bicarb 30, glucose 140, BUN 30, creatinine 1.8, white count 8.7, hemoglobin 12.0 hematocrit 37.1, platelets 199.  Urinalysis 30 protein, specific gravity 1.015, white cells 11-20.  Chest x-ray negative for infiltrates.  EKG normal sinus rhythm, normal axis, normal intervals.  Patient was admitted to the hospital with the working diagnosis of metabolic encephalopathy complicated by acute kidney injury, hypokalemia and urinary tract infection.  1.  Metabolic encephalopathy.  It was presumed to be related to dehydration, patient symptoms improved with medical therapy.  By the time of discharge he is back  to his baseline.  No confusion or agitation.  2.  Prerenal acute kidney injury on suspected chronic kidney disease 3, complicated by hypokalemia. Patient received intravenous isotonic solutions with improvement of kidney function, discharge creatinine 1.6, with bicarbonate of 22.  Potassium was corrected, with discharge potassium 4.7.  Patient was advised to discontinue lisinopril/hydrochlorothiazide, will need close follow-up of kidney function within 7 days.  I suspect patient has chronic kidney disease with baseline creatinine 1.5-1.6, calculated GFR 47, to consider resume ACE inhibitor once glomerular filtration rate becomes more stable.  Further work-up with renal ultrasonography showed normal renal cortical thickness, 5 mm probably nonobstructing right renal calculus, normal appearance of the urinary bladder.  No hydronephrosis.  3.  Urinary tract infection (present on admission).  Patient received IV ceftriaxone during his hospitalization, he will complete 3 days therapy with cephalexin.  Urine culture has been no growth but, discharge.  Late entry: urine culture positive for Klebsiella Pneumonia > 100, 000 CFU, sensitive to cephalosporins.   4.  Hypertension.  Patient was continued on metoprolol, blood pressures remain well controlled, hydrochlorothiazide and lisinopril were held.  5.  Type 2 diabetes mellitus.  Capillary glucose remained well controlled, patient was placed on insulin sliding scale, long-acting insulin was used during his hospitalization. Patient will resume metformin at discharge.   6.  Coronary artery disease with dyslipidemia.  Patient remains chest pain-free, he was continued on clopidogrel, Zetia and Crestor.  Discharge Diagnoses:  Active Problems:   Acute kidney injury [facility])   Metabolic encephalopathy    Discharge Instructions   Allergies as of 12/01/2017   No Known Allergies     Medication List    STOP taking these medications  lisinopril-hydrochlorothiazide 20-12.5 MG tablet Commonly known as:  PRINZIDE,ZESTORETIC     TAKE these medications   acetaminophen 500 MG tablet Commonly known as:  TYLENOL Take 1,000 mg by mouth every 6 (six) hours as needed for mild pain, moderate pain, fever or headache.   amLODipine 5 MG tablet Commonly known as:  NORVASC Take 1 tablet (5 mg total) by mouth daily.   cephALEXin 500 MG capsule Commonly known as:  KEFLEX Take 1 capsule (500 mg total) by mouth every 12 (twelve) hours for 3 days.   clopidogrel 75 MG tablet Commonly known as:  PLAVIX Take 1 tablet (75 mg total) by mouth daily.   ezetimibe 10 MG tablet Commonly known as:  ZETIA Take 1 tablet (10 mg total) by mouth daily.   metFORMIN 500 MG tablet Commonly known as:  GLUCOPHAGE Take 1 tablet (500 mg total) by mouth daily with breakfast.   metoprolol succinate 50 MG 24 hr tablet Commonly known as:  TOPROL-XL Take 1 tablet (50 mg total) by mouth daily. Take with or immediately following a meal.   nitroGLYCERIN 0.4 MG SL tablet Commonly known as:  NITROSTAT Place 0.4 mg under the tongue every 5 (five) minutes as needed for chest pain.   rosuvastatin 40 MG tablet Commonly known as:  CRESTOR Take 1 tablet (40 mg total) by mouth daily.       No Known Allergies  Consultations:     Procedures/Studies: Dg Chest 2 View  Result Date: 11/29/2017 CLINICAL DATA:  Weakness and fatigue. EXAM: CHEST - 2 VIEW COMPARISON:  12/31/2006 chest radiograph FINDINGS: The cardiomediastinal silhouette is unremarkable. There is no evidence of focal airspace disease, pulmonary edema, suspicious pulmonary nodule/mass, pleural effusion, or pneumothorax. No acute bony abnormalities are identified. IMPRESSION: No active cardiopulmonary disease. Electronically Signed   By: Harmon Pier M.D.   On: 11/29/2017 16:13   US Renal  Result Date: 11/29/2017 CLINICAL DATA:  Acute renal insufficiency, increased BUN and creatinine. EXAM:  RENAL / URINARY TRACT ULTRASOUND COMPLETE COMPARISON:  None. FINDINGS: Right Kidney: Length: 10.1 cm. Echogenicity within normal limits. No mass or hydronephrosis visualized. 5 mm echogenic focus within the upper of the right kidney, likely representing nonobstructive calculus. Left Kidney: Length: 11.0 cm. Echogenicity within normal limits. No mass or hydronephrosis visualized. Bladder: Appears normal for degree of bladder distention. IMPRESSION: Normal renal cortical thickness. 5 mm probable nonobstructive right renal calculus. Normal appearance of the urinary bladder. Electronically Signed   By: Ted Mcalpine M.D.   On: 11/29/2017 22:26       Subjective: Patient is feeling back to hid baseline, no nausea or vomiting, ambulating well, no chest pain or dyspnea. Good appetite.   Discharge Exam: Vitals:   11/30/17 1947 12/01/17 0448  BP: 107/66 116/74  Pulse: 82 67  Resp: 16 17  Temp: 98.9 F (37.2 C) 98.7 F (37.1 C)  SpO2: 99% 100%   Vitals:   11/30/17 1717 11/30/17 1947 11/30/17 2134 12/01/17 0448  BP: (!) 106/56 107/66  116/74  Pulse: 71 82  67  Resp: 18 16  17   Temp: 98.3 F (36.8 C) 98.9 F (37.2 C)  98.7 F (37.1 C)  TempSrc: Oral Oral  Oral  SpO2: 99% 99%  100%  Weight:   88.9 kg (196 lb)     General: Not in pain or dyspnea Neurology: Awake and alert, non focal  E ENT: no pallor, no icterus, oral mucosa moist Cardiovascular: No JVD. S1-S2 present, rhythmic, no gallops, rubs, or murmurs.  No lower extremity edema. Pulmonary: vesicular breath sounds bilaterally, adequate air movement, no wheezing, rhonchi or rales. Gastrointestinal. Abdomen with no organomegaly, non tender, no rebound or guarding Skin. No rashes Musculoskeletal: no joint deformities   The results of significant diagnostics from this hospitalization (including imaging, microbiology, ancillary and laboratory) are listed below for reference.     Microbiology: No results found for this or any  previous visit (from the past 240 hour(s)).   Labs: BNP (last 3 results) No results for input(s): BNP in the last 8760 hours. Basic Metabolic Panel: Recent Labs  Lab 11/29/17 1409 11/30/17 0437 12/01/17 0404  NA 139 139 139  K 2.9* 3.2* 4.7  CL 99* 106 106  CO2 30 28 22   GLUCOSE 140* 121* 150*  BUN 30* 20 18  CREATININE 1.80* 1.53* 1.61*  CALCIUM 9.6 8.9 8.8*  MG  --  2.2  --   PHOS  --  1.9*  --    Liver Function Tests: No results for input(s): AST, ALT, ALKPHOS, BILITOT, PROT, ALBUMIN in the last 168 hours. No results for input(s): LIPASE, AMYLASE in the last 168 hours. No results for input(s): AMMONIA in the last 168 hours. CBC: Recent Labs  Lab 11/29/17 1409 11/30/17 0437  WBC 8.7 7.7  HGB 12.0* 11.4*  HCT 37.1* 35.3*  MCV 82.8 82.9  PLT 199 209   Cardiac Enzymes: No results for input(s): CKTOTAL, CKMB, CKMBINDEX, TROPONINI in the last 168 hours. BNP: Invalid input(s): POCBNP CBG: Recent Labs  Lab 11/30/17 0814 11/30/17 1137 11/30/17 1715 11/30/17 2124 12/01/17 0802  GLUCAP 126* 121* 116* 132* 108*   D-Dimer No results for input(s): DDIMER in the last 72 hours. Hgb A1c No results for input(s): HGBA1C in the last 72 hours. Lipid Profile No results for input(s): CHOL, HDL, LDLCALC, TRIG, CHOLHDL, LDLDIRECT in the last 72 hours. Thyroid function studies No results for input(s): TSH, T4TOTAL, T3FREE, THYROIDAB in the last 72 hours.  Invalid input(s): FREET3 Anemia work up No results for input(s): VITAMINB12, FOLATE, FERRITIN, TIBC, IRON, RETICCTPCT in the last 72 hours. Urinalysis    Component Value Date/Time   COLORURINE YELLOW 11/29/2017 1647   APPEARANCEUR HAZY (A) 11/29/2017 1647   LABSPEC 1.015 11/29/2017 1647   PHURINE 5.0 11/29/2017 1647   GLUCOSEU NEGATIVE 11/29/2017 1647   HGBUR MODERATE (A) 11/29/2017 1647   BILIRUBINUR NEGATIVE 11/29/2017 1647   KETONESUR NEGATIVE 11/29/2017 1647   PROTEINUR 30 (A) 11/29/2017 1647   NITRITE  POSITIVE (A) 11/29/2017 1647   LEUKOCYTESUR SMALL (A) 11/29/2017 1647   Sepsis Labs Invalid input(s): PROCALCITONIN,  WBC,  LACTICIDVEN Microbiology No results found for this or any previous visit (from the past 240 hour(s)).   Time coordinating discharge: 45 minutes  SIGNED:   Coralie KeensMauricio Daniel Abia Monaco, MD  [facility] 12/01/2017, 8:42 AM Pager [phone]  If 7PM-7AM, please contact night-coverage [email or web address] Password [email or web address]

## 2017-12-01 NOTE — Progress Notes (Signed)
Physical Therapy Treatment Patient Details Name: [name] MRN: [id number] DOB: 12/28/1935 Today's Date: 12/01/2017    History of Present Illness  [name] is a 82 y.o. male with a known history of  CAD status post MI, hypertension, hyperlipidemia, type 2 diabetes presents to the emergency department for evaluation of generalized weakness. UTI with acute kidney injury    PT Comments    Plan for discharge home today. Patient declining practicing steps this session but agreeable for ambulation and balance training. Patient demonstrates improvement in static and dynamic balance. Ambulating 300 feet with no assistive device modified independently with no evidence of imbalance. Patient status in regards to physical therapy goals are adequate for discharge. No further acute PT needs. Signing off.    Follow Up Recommendations  No PT follow up     Equipment Recommendations       Recommendations for Other Services       Precautions / Restrictions Precautions Precautions: None Restrictions Weight Bearing Restrictions: No    Mobility  Bed Mobility Overal bed mobility: Independent                Transfers Overall transfer level: Independent Equipment used: None Transfers: Sit to/from Stand Sit to Stand: Independent            Ambulation/Gait Ambulation/Gait assistance: Modified independent (Device/Increase time) Ambulation Distance (Feet): 300 Feet Assistive device: None;IV Pole Gait Pattern/deviations: Step-through pattern;Decreased dorsiflexion - right;Decreased dorsiflexion - left     General Gait Details: Able to negotiate around and over obstacles with no LOB noted   Stairs             Wheelchair Mobility    Modified Rankin (Stroke Patients Only)       Balance Overall balance assessment: Needs assistance Sitting-balance support: No upper extremity supported;Feet supported Sitting balance-Leahy Scale: Normal     Standing balance  support: No upper extremity supported;During functional activity Standing balance-Leahy Scale: Good   Single Leg Stance - Right Leg: 8 Single Leg Stance - Left Leg: 9 Tandem Stance - Right Leg: 30   Rhomberg - Eyes Opened: 30                  Cognition Arousal/Alertness: Awake/alert Behavior During Therapy: WFL for tasks assessed/performed Overall Cognitive Status: Within Functional Limits for tasks assessed                                        Exercises Other Exercises Other Exercises: Static standing balance including tandem stance, Rhomberg, and single leg stance Other Exercises: Dynamic balance throughout gait including walking around/over obstacles     General Comments        Pertinent Vitals/Pain Pain Assessment: No/denies pain    Home Living                      Prior Function            PT Goals (current goals can now be found in the care plan section) Acute Rehab PT Goals Patient Stated Goal: home soon PT Goal Formulation: With patient Time For Goal Achievement: 12/14/17 Potential to Achieve Goals: Good Progress towards PT goals: Goals met/education completed, patient discharged from PT    Frequency    Min 3X/week      PT Plan Other (comment)(d/c services)    Co-evaluation  AM-PAC PT "6 Clicks" Daily Activity  Outcome Measure  Difficulty turning over in bed (including adjusting bedclothes, sheets and blankets)?: None Difficulty moving from lying on back to sitting on the side of the bed? : None Difficulty sitting down on and standing up from a chair with arms (e.g., wheelchair, bedside commode, etc,.)?: None Help needed moving to and from a bed to chair (including a wheelchair)?: None Help needed walking in hospital room?: A Little Help needed climbing 3-5 steps with a railing? : A Little 6 Click Score: 22    End of Session Equipment Utilized During Treatment: Gait belt Activity Tolerance:  Patient tolerated treatment well Patient left: with call bell/phone within reach;with family/visitor present;Other (comment)(seated EOB) Nurse Communication: Mobility status PT Visit Diagnosis: Other abnormalities of gait and mobility (R26.89);Muscle weakness (generalized) (M62.81)     Time: 9957-9009 PT Time Calculation (min) (ACUTE ONLY): 11 min  Charges:  $Therapeutic Activity: 8-22 mins                    G Codes:       Ellamae Sia, PT, DPT Acute Rehabilitation Services  Pager: [phone]    Willy Eddy 12/01/2017, 11:26 AM

## 2017-12-02 LAB — URINE CULTURE

## 2019-02-04 IMAGING — DX DG CHEST 2V
2 series · 2 of 2 positions shown · non-contrast
Comparison: 12/31/2006 chest radiograph

CLINICAL DATA: Weakness and fatigue.

EXAM:
CHEST - 2 VIEW

[w chest pa]
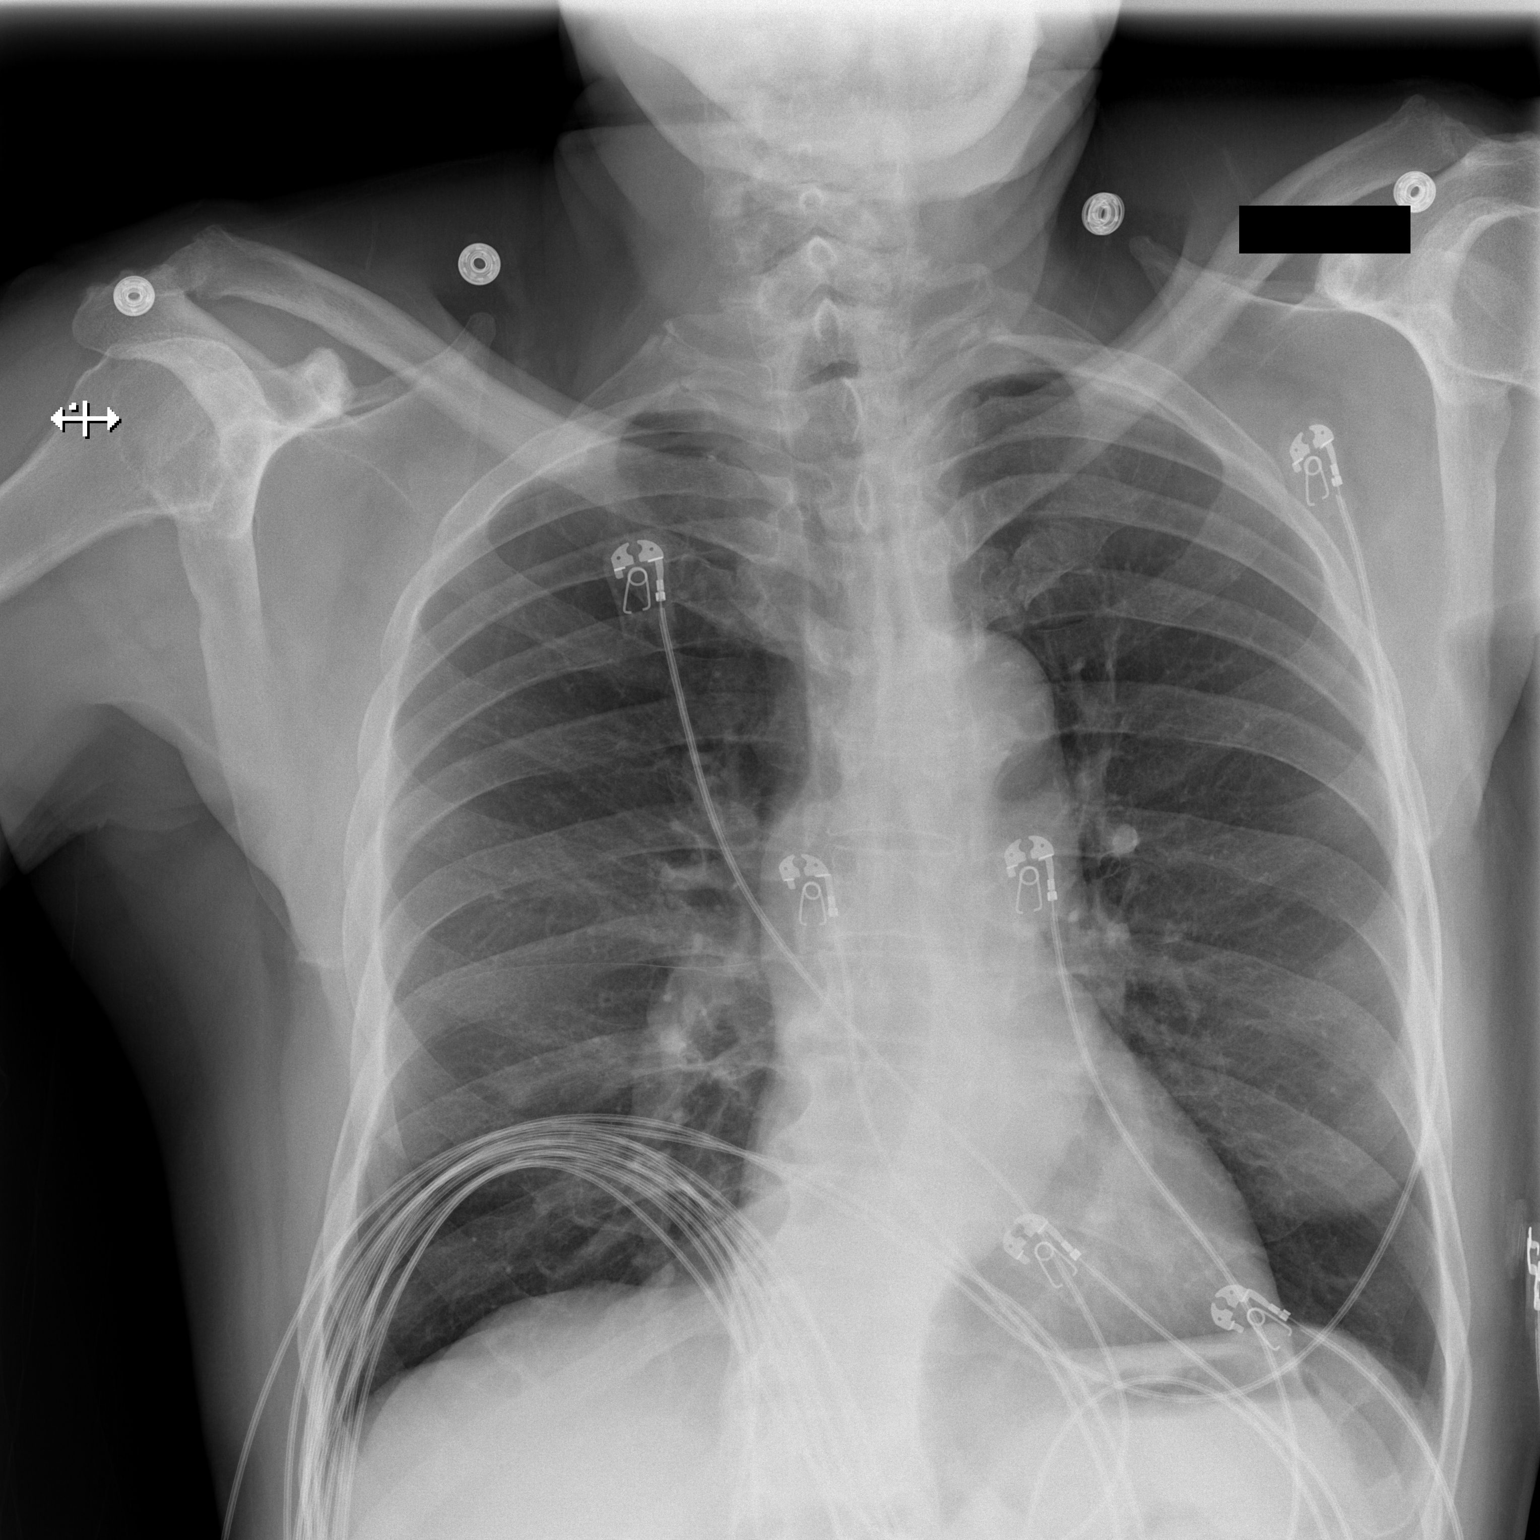

[w chest lat]
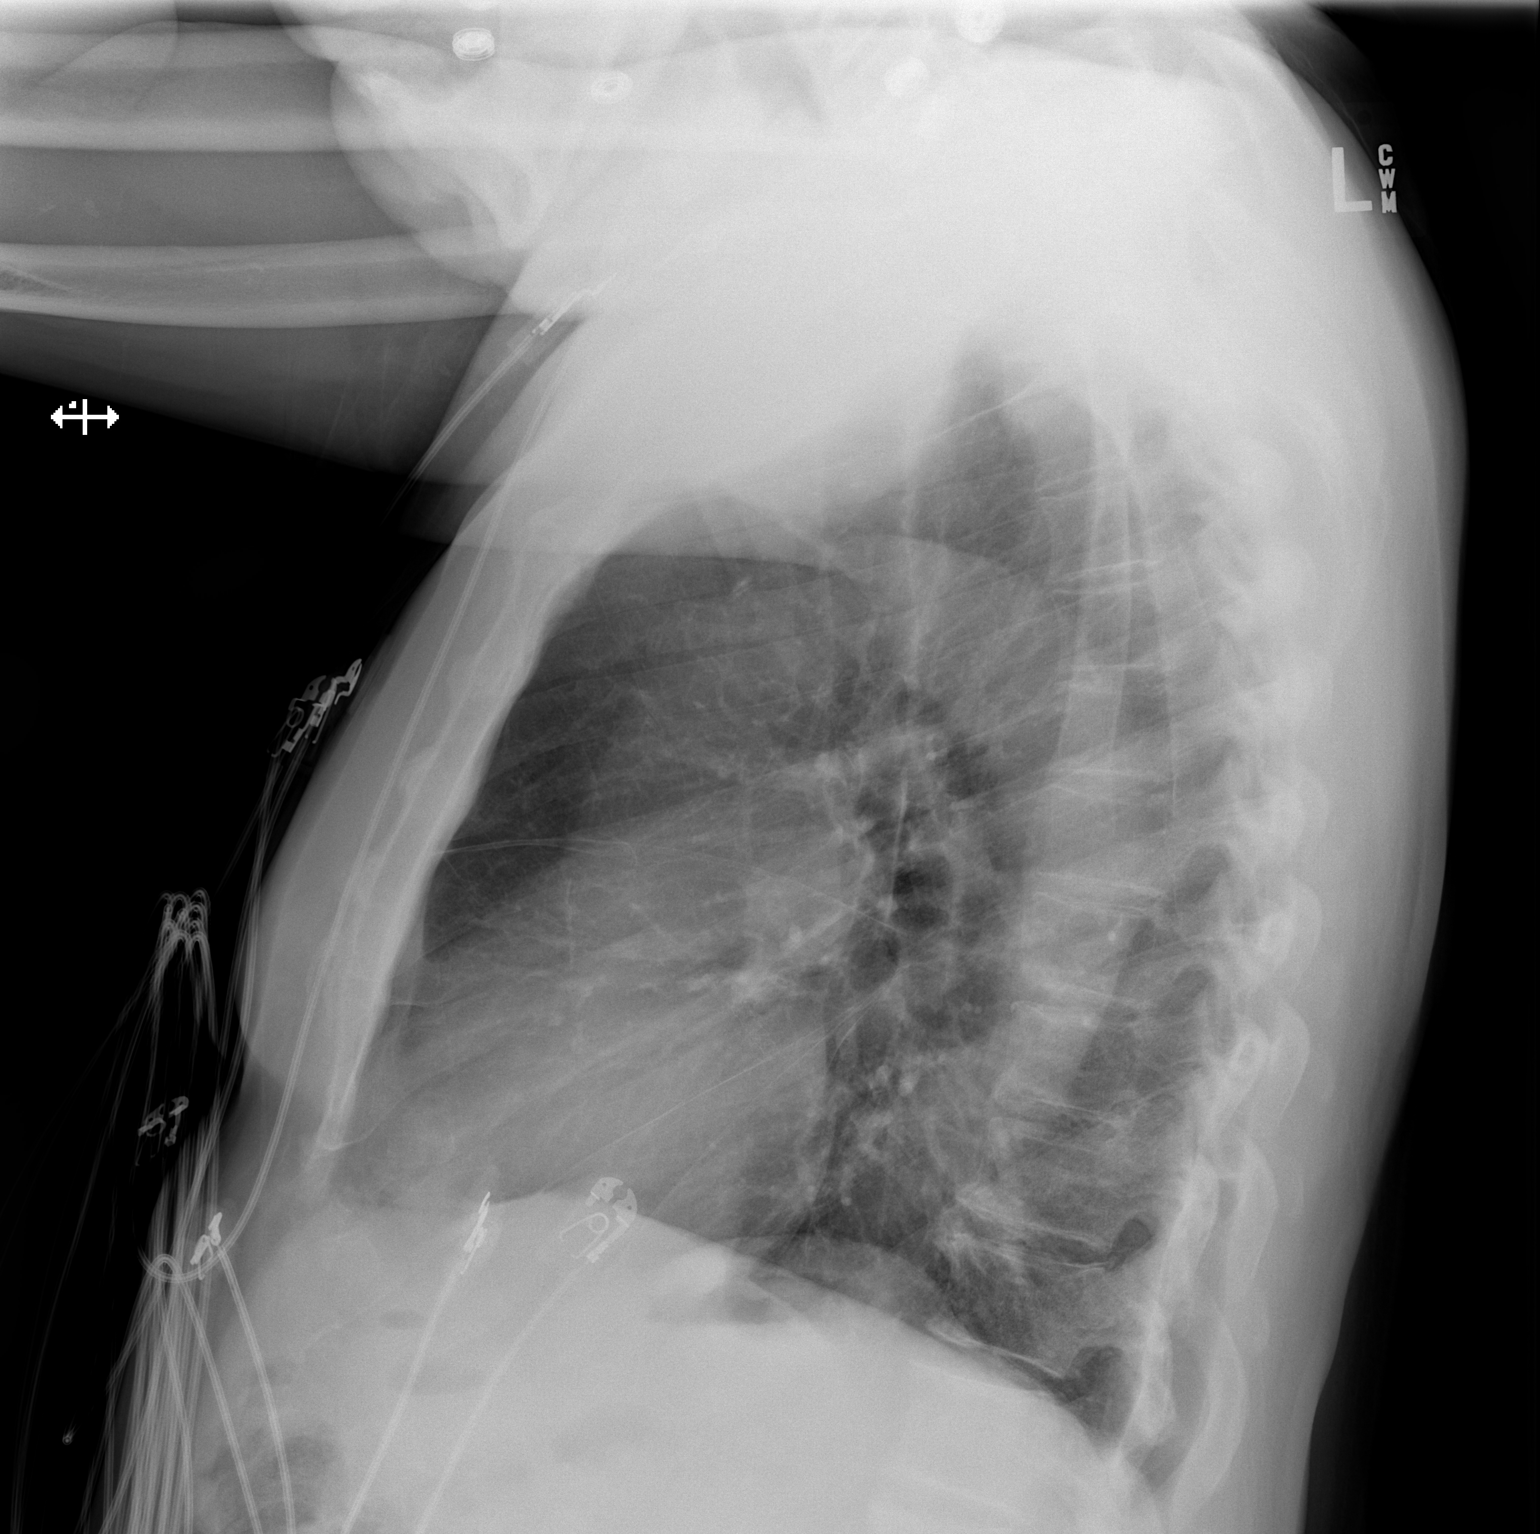

[2 of 2 positions shown; findings below may reference images not displayed]

FINDINGS: The cardiomediastinal silhouette is unremarkable.

There is no evidence of focal airspace disease, pulmonary edema,
suspicious pulmonary nodule/mass, pleural effusion, or pneumothorax.

No acute bony abnormalities are identified.
IMPRESSION: No active cardiopulmonary disease.

## 2019-02-27 ENCOUNTER — Other Ambulatory Visit: Payer: Self-pay

## 2019-02-27 ENCOUNTER — Encounter (HOSPITAL_COMMUNITY): Payer: Self-pay | Admitting: Emergency Medicine

## 2019-02-27 ENCOUNTER — Inpatient Hospital Stay (HOSPITAL_COMMUNITY)
Admission: EM | Admit: 2019-02-27 | Discharge: 2019-03-05 | DRG: 418 | Disposition: A | Discharge status: Home / Self Care | Payer: Medicare Other | Attending: Internal Medicine | Admitting: Internal Medicine

## 2019-02-27 ENCOUNTER — Emergency Department (HOSPITAL_COMMUNITY): Payer: Medicare Other

## 2019-02-27 ENCOUNTER — Inpatient Hospital Stay (HOSPITAL_COMMUNITY): Payer: Medicare Other

## 2019-02-27 DIAGNOSIS — E119 Type 2 diabetes mellitus without complications: Secondary | ICD-10-CM

## 2019-02-27 DIAGNOSIS — I2510 Atherosclerotic heart disease of native coronary artery without angina pectoris: Secondary | ICD-10-CM | POA: Diagnosis present

## 2019-02-27 DIAGNOSIS — K8020 Calculus of gallbladder without cholecystitis without obstruction: Secondary | ICD-10-CM | POA: Diagnosis present

## 2019-02-27 DIAGNOSIS — Z7902 Long term (current) use of antithrombotics/antiplatelets: Secondary | ICD-10-CM | POA: Diagnosis not present

## 2019-02-27 DIAGNOSIS — E1122 Type 2 diabetes mellitus with diabetic chronic kidney disease: Secondary | ICD-10-CM | POA: Diagnosis present

## 2019-02-27 DIAGNOSIS — Z7984 Long term (current) use of oral hypoglycemic drugs: Secondary | ICD-10-CM

## 2019-02-27 DIAGNOSIS — K851 Biliary acute pancreatitis without necrosis or infection: Secondary | ICD-10-CM | POA: Diagnosis not present

## 2019-02-27 DIAGNOSIS — E785 Hyperlipidemia, unspecified: Secondary | ICD-10-CM | POA: Diagnosis present

## 2019-02-27 DIAGNOSIS — I1310 Hypertensive heart and chronic kidney disease without heart failure, with stage 1 through stage 4 chronic kidney disease, or unspecified chronic kidney disease: Secondary | ICD-10-CM | POA: Diagnosis present

## 2019-02-27 DIAGNOSIS — Z79899 Other long term (current) drug therapy: Secondary | ICD-10-CM

## 2019-02-27 DIAGNOSIS — Z87891 Personal history of nicotine dependence: Secondary | ICD-10-CM

## 2019-02-27 DIAGNOSIS — Z23 Encounter for immunization: Secondary | ICD-10-CM

## 2019-02-27 DIAGNOSIS — E876 Hypokalemia: Secondary | ICD-10-CM | POA: Diagnosis not present

## 2019-02-27 DIAGNOSIS — D72829 Elevated white blood cell count, unspecified: Secondary | ICD-10-CM | POA: Diagnosis present

## 2019-02-27 DIAGNOSIS — Z8249 Family history of ischemic heart disease and other diseases of the circulatory system: Secondary | ICD-10-CM | POA: Diagnosis not present

## 2019-02-27 DIAGNOSIS — K219 Gastro-esophageal reflux disease without esophagitis: Secondary | ICD-10-CM | POA: Diagnosis present

## 2019-02-27 DIAGNOSIS — N183 Chronic kidney disease, stage 3 (moderate): Secondary | ICD-10-CM | POA: Diagnosis present

## 2019-02-27 DIAGNOSIS — K8010 Calculus of gallbladder with chronic cholecystitis without obstruction: Secondary | ICD-10-CM | POA: Diagnosis present

## 2019-02-27 DIAGNOSIS — R7401 Elevation of levels of liver transaminase levels: Secondary | ICD-10-CM

## 2019-02-27 DIAGNOSIS — Z20828 Contact with and (suspected) exposure to other viral communicable diseases: Secondary | ICD-10-CM | POA: Diagnosis present

## 2019-02-27 DIAGNOSIS — R112 Nausea with vomiting, unspecified: Secondary | ICD-10-CM | POA: Diagnosis not present

## 2019-02-27 DIAGNOSIS — K859 Acute pancreatitis without necrosis or infection, unspecified: Secondary | ICD-10-CM | POA: Diagnosis present

## 2019-02-27 DIAGNOSIS — Z955 Presence of coronary angioplasty implant and graft: Secondary | ICD-10-CM

## 2019-02-27 LAB — GLUCOSE, CAPILLARY
Glucose-Capillary: 121 mg/dL — ABNORMAL HIGH (ref 70–99)
Glucose-Capillary: 111 mg/dL — ABNORMAL HIGH (ref 70–99)
Glucose-Capillary: 144 mg/dL — ABNORMAL HIGH (ref 70–99)

## 2019-02-27 LAB — CBC WITH DIFFERENTIAL/PLATELET
Abs Immature Granulocytes: 0.05 10*3/uL (ref 0.00–0.07)
Basophils Absolute: 0 10*3/uL (ref 0.0–0.1)
Basophils Relative: 0 %
Eosinophils Absolute: 0 10*3/uL (ref 0.0–0.5)
Eosinophils Relative: 0 %
HCT: 47 % (ref 39.0–52.0)
Hemoglobin: 14.6 g/dL (ref 13.0–17.0)
Immature Granulocytes: 0 %
Lymphocytes Relative: 3 %
Lymphs Abs: 0.4 10*3/uL — ABNORMAL LOW (ref 0.7–4.0)
MCH: 26.7 pg (ref 26.0–34.0)
MCHC: 31.1 g/dL (ref 30.0–36.0)
MCV: 86.1 fL (ref 80.0–100.0)
Monocytes Absolute: 1.2 10*3/uL — ABNORMAL HIGH (ref 0.1–1.0)
Monocytes Relative: 10 %
Neutro Abs: 10 10*3/uL — ABNORMAL HIGH (ref 1.7–7.7)
Neutrophils Relative %: 87 %
Platelets: 179 10*3/uL (ref 150–400)
RBC: 5.46 MIL/uL (ref 4.22–5.81)
RDW: 14 % (ref 11.5–15.5)
WBC: 11.7 10*3/uL — ABNORMAL HIGH (ref 4.0–10.5)
nRBC: 0 % (ref 0.0–0.2)

## 2019-02-27 LAB — URINALYSIS, ROUTINE W REFLEX MICROSCOPIC
Bacteria, UA: NONE SEEN
Bilirubin Urine: NEGATIVE
Glucose, UA: NEGATIVE mg/dL
Ketones, ur: NEGATIVE mg/dL
Leukocytes,Ua: NEGATIVE
Nitrite: NEGATIVE
Protein, ur: 30 mg/dL — AB
Specific Gravity, Urine: 1.032 — ABNORMAL HIGH (ref 1.005–1.030)
pH: 5 (ref 5.0–8.0)

## 2019-02-27 LAB — HEMOGLOBIN A1C
Hgb A1c MFr Bld: 6.5 % — ABNORMAL HIGH (ref 4.8–5.6)
Mean Plasma Glucose: 139.85 mg/dL

## 2019-02-27 LAB — TRIGLYCERIDES: Triglycerides: 57 mg/dL (ref <150)

## 2019-02-27 LAB — PROTIME-INR
INR: 1 (ref 0.8–1.2)
Prothrombin Time: 13.3 seconds (ref 11.4–15.2)

## 2019-02-27 LAB — COMPREHENSIVE METABOLIC PANEL
ALT: 843 U/L — ABNORMAL HIGH (ref 0–44)
AST: 1195 U/L — ABNORMAL HIGH (ref 15–41)
Albumin: 3.8 g/dL (ref 3.5–5.0)
Alkaline Phosphatase: 115 U/L (ref 38–126)
Anion gap: 15 (ref 5–15)
BUN: 10 mg/dL (ref 8–23)
CO2: 21 mmol/L — ABNORMAL LOW (ref 22–32)
Calcium: 9.4 mg/dL (ref 8.9–10.3)
Chloride: 104 mmol/L (ref 98–111)
Creatinine, Ser: 1.18 mg/dL (ref 0.61–1.24)
GFR calc Af Amer: >60 mL/min (ref >60)
GFR calc non Af Amer: 58 mL/min — ABNORMAL LOW (ref >60)
Glucose, Bld: 173 mg/dL — ABNORMAL HIGH (ref 70–99)
Potassium: 3.8 mmol/L (ref 3.5–5.1)
Sodium: 140 mmol/L (ref 135–145)
Total Bilirubin: 2.6 mg/dL — ABNORMAL HIGH (ref 0.3–1.2)
Total Protein: 7.1 g/dL (ref 6.5–8.1)

## 2019-02-27 LAB — SARS CORONAVIRUS 2 BY RT PCR (HOSPITAL ORDER, PERFORMED IN ~~LOC~~ HOSPITAL LAB): SARS Coronavirus 2: NEGATIVE

## 2019-02-27 LAB — LIPASE, BLOOD: Lipase: 7221 U/L — ABNORMAL HIGH (ref 11–51)

## 2019-02-27 LAB — APTT: aPTT: 22 seconds — ABNORMAL LOW (ref 24–36)

## 2019-02-27 MED ORDER — ALBUTEROL SULFATE (2.5 MG/3ML) 0.083% IN NEBU: 2.5000 mg | INHALATION_SOLUTION | Freq: Four times a day (QID) | RESPIRATORY_TRACT | Status: DC | PRN

## 2019-02-27 MED ORDER — IOHEXOL 300 MG/ML  SOLN
100.0000 mL | Freq: Once | INTRAMUSCULAR | Status: AC | PRN
  Administered 2019-02-27: 100 mL via INTRAVENOUS

## 2019-02-27 MED ORDER — CLOPIDOGREL BISULFATE 75 MG PO TABS
75.0000 mg | ORAL_TABLET | Freq: Every day | ORAL | Status: DC
  Administered 2019-02-27: 75 mg via ORAL
  Filled 2019-02-27: qty 1

## 2019-02-27 MED ORDER — ENOXAPARIN SODIUM 40 MG/0.4ML ~~LOC~~ SOLN
40.0000 mg | Freq: Every day | SUBCUTANEOUS | Status: DC
  Administered 2019-02-27 – 2019-03-05 (×6): 40 mg via SUBCUTANEOUS
  Filled 2019-02-27 (×7): qty 0.4

## 2019-02-27 MED ORDER — METOPROLOL SUCCINATE ER 25 MG PO TB24
50.0000 mg | ORAL_TABLET | Freq: Every day | ORAL | Status: DC
  Administered 2019-02-27 – 2019-03-05 (×7): 50 mg via ORAL
  Filled 2019-02-27 (×7): qty 2

## 2019-02-27 MED ORDER — INSULIN ASPART 100 UNIT/ML ~~LOC~~ SOLN
0.0000 [IU] | Freq: Three times a day (TID) | SUBCUTANEOUS | Status: DC
  Administered 2019-02-27 – 2019-02-28 (×3): 1 [IU] via SUBCUTANEOUS
  Administered 2019-02-28 – 2019-03-01 (×2): 2 [IU] via SUBCUTANEOUS
  Administered 2019-03-01 – 2019-03-05 (×2): 1 [IU] via SUBCUTANEOUS

## 2019-02-27 MED ORDER — AMLODIPINE BESYLATE 5 MG PO TABS
5.0000 mg | ORAL_TABLET | Freq: Every day | ORAL | Status: DC
  Administered 2019-02-27 – 2019-03-05 (×7): 5 mg via ORAL
  Filled 2019-02-27 (×7): qty 1

## 2019-02-27 MED ORDER — ONDANSETRON HCL 4 MG/2ML IJ SOLN: 4.0000 mg | Freq: Three times a day (TID) | INTRAMUSCULAR | Status: DC | PRN

## 2019-02-27 MED ORDER — FENTANYL CITRATE (PF) 100 MCG/2ML IJ SOLN
50.0000 ug | Freq: Once | INTRAMUSCULAR | Status: AC
  Administered 2019-02-27: 50 ug via INTRAVENOUS
  Filled 2019-02-27: qty 2

## 2019-02-27 MED ORDER — SODIUM CHLORIDE 0.9 % IV SOLN
INTRAVENOUS | Status: DC
  Administered 2019-02-27 – 2019-03-04 (×18): via INTRAVENOUS

## 2019-02-27 MED ORDER — MORPHINE SULFATE (PF) 2 MG/ML IV SOLN
1.0000 mg | INTRAVENOUS | Status: DC | PRN
  Administered 2019-02-27 – 2019-03-03 (×11): 1 mg via INTRAVENOUS
  Filled 2019-02-27 (×11): qty 1

## 2019-02-27 MED ORDER — HYDRALAZINE HCL 20 MG/ML IJ SOLN
5.0000 mg | INTRAMUSCULAR | Status: DC | PRN
  Administered 2019-03-05: 5 mg via INTRAVENOUS
  Filled 2019-02-27: qty 1

## 2019-02-27 NOTE — ED Provider Notes (Signed)
[facility]   CSN: [id number] Arrival date & time: 02/27/19  0208     History   Chief Complaint Chief Complaint  Patient presents with   Abdominal Pain   Nausea   Emesis    HPI Allen ForemanJohn L Buck is a 83 y.o. male.     The history is provided by the patient and medical records.  Abdominal Pain Associated symptoms: nausea and vomiting   Emesis Associated symptoms: abdominal pain      83 y.o. M with history of CAD and MI, HLP, diabetes, presenting to the ED for abdominal pain.  States this is been ongoing for the past 24 hours but worsening in intensity.  Pain localized to upper and mid abdomen, he does report some pain in his back as well.  He denies any chest pain or shortness of breath.  He has had a few episodes of nausea and vomiting.  He did try to eat earlier this morning but it "did not sit right" so he has not eaten anything else today.  He denies any fever, cough, nasal congestion, or other URI symptoms.  He has not had any sick contacts.  He denies any prior abdominal surgeries.  He was given fentanyl and Zofran in route with good improvement of his symptoms.  Past Medical History:  Diagnosis Date   Acute MI, subendocardial (HCC)    Coronary artery disease    HCD (hypertensive cardiovascular disease)    Hyperlipidemia    IHD (ischemic heart disease)     Patient Active Problem List   Diagnosis Date Noted   Metabolic encephalopathy 11/30/2017   Acute kidney injury (HCC) 11/29/2017   Benign hypertensive heart disease without heart failure 04/30/2013   Ischemic heart disease 02/15/2011   Hypercholesterolemia 02/15/2011   Type II or unspecified type diabetes mellitus without mention of complication, uncontrolled 02/15/2011    Past Surgical History:  Procedure Laterality Date   CARDIAC CATHETERIZATION  01/02/2007   EF 50%   CORONARY STENT PLACEMENT     LEFT CIRCUMFLEX        Home  Medications    Prior to Admission medications   Medication Sig Start Date End Date Taking? Authorizing Provider  acetaminophen (TYLENOL) 500 MG tablet Take 1,000 mg by mouth every 6 (six) hours as needed for mild pain, moderate pain, fever or headache.    [provider]  amLODipine (NORVASC) 5 MG tablet Take 1 tablet (5 mg total) by mouth daily. 12/01/17 12/31/17  Arrien, York RamMauricio Daniel, MD  clopidogrel (PLAVIX) 75 MG tablet Take 1 tablet (75 mg total) by mouth daily. 09/15/15   Cassell ClementBrackbill, Thomas, MD  ezetimibe (ZETIA) 10 MG tablet Take 1 tablet (10 mg total) by mouth daily. 08/09/17   Wendall StadeNishan, Peter C, MD  metFORMIN (GLUCOPHAGE) 500 MG tablet Take 1 tablet (500 mg total) by mouth daily with breakfast. 09/15/15   Wendall StadeNishan, Peter C, MD  metoprolol succinate (TOPROL-XL) 50 MG 24 hr tablet Take 1 tablet (50 mg total) by mouth daily. Take with or immediately following a meal. 09/12/15   Cassell ClementBrackbill, Thomas, MD  nitroGLYCERIN (NITROSTAT) 0.4 MG SL tablet Place 0.4 mg under the tongue every 5 (five) minutes as needed for chest pain.    [provider]  rosuvastatin (CRESTOR) 40 MG tablet Take 1 tablet (40 mg total) by mouth daily. 08/09/17 11/29/17  Wendall StadeNishan, Peter C, MD    Family History Family History  Problem Relation Age of Onset   Healthy  Mother        NEG HX   Healthy Father        NEG HX    Social History Social History   Tobacco Use   Smoking status: Former Smoker    Quit date: 02/02/1985    Years since quitting: 34.0   Smokeless tobacco: Never Used  Substance Use Topics   Alcohol use: No   Drug use: No     Allergies   Patient has no known allergies.   Review of Systems Review of Systems  Gastrointestinal: Positive for abdominal pain, nausea and vomiting.  All other systems reviewed and are negative.    Physical Exam Updated Vital Signs BP (!) 142/83    Pulse 96    Temp 97.8 F (36.6 C) (Oral)    Resp 17    SpO2 97%   Physical Exam Vitals signs and  nursing note reviewed.  Constitutional:      Appearance: He is well-developed.  HENT:     Head: Normocephalic and atraumatic.  Eyes:     Conjunctiva/sclera: Conjunctivae normal.     Pupils: Pupils are equal, round, and reactive to light.  Neck:     Musculoskeletal: Normal range of motion.  Cardiovascular:     Rate and Rhythm: Normal rate and regular rhythm.     Heart sounds: Normal heart sounds.  Pulmonary:     Effort: Pulmonary effort is normal.     Breath sounds: Normal breath sounds.  Abdominal:     General: Bowel sounds are normal.     Palpations: Abdomen is soft.     Tenderness: There is abdominal tenderness in the epigastric area and periumbilical area.  Musculoskeletal: Normal range of motion.  Skin:    General: Skin is warm and dry.  Neurological:     Mental Status: He is alert and oriented to person, place, and time.      ED Treatments / Results  Labs (all labs ordered are listed, but only abnormal results are displayed) Labs Reviewed  CBC WITH DIFFERENTIAL/PLATELET - Abnormal; Notable for the following components:      Result Value   WBC 11.7 (*)    Neutro Abs 10.0 (*)    Lymphs Abs 0.4 (*)    Monocytes Absolute 1.2 (*)    All other components within normal limits  COMPREHENSIVE METABOLIC PANEL - Abnormal; Notable for the following components:   CO2 21 (*)    Glucose, Bld 173 (*)    AST 1,195 (*)    ALT 843 (*)    Total Bilirubin 2.6 (*)    GFR calc non Af Amer 58 (*)    All other components within normal limits  SARS CORONAVIRUS 2 (HOSPITAL ORDER, PERFORMED IN [facility])  LIPASE, BLOOD  URINALYSIS, ROUTINE W REFLEX MICROSCOPIC    EKG None  Radiology Ct Abdomen Pelvis W Contrast  Result Date: 02/27/2019 CLINICAL DATA:  Acute generalized abdominal pain, localized periumbilical EXAM: CT ABDOMEN AND PELVIS WITH CONTRAST TECHNIQUE: Multidetector CT imaging of the abdomen and pelvis was performed using the standard protocol following bolus  administration of intravenous contrast. CONTRAST:  100mL OMNIPAQUE IOHEXOL 300 MG/ML  SOLN COMPARISON:  Renal ultrasound November 29, 2017 FINDINGS: Lower chest: Basilar areas of subsegmental atelectasis and/or scarring. Cardiac size is upper limits normal. Extensive atherosclerotic calcifications of the coronary arteries. Aortic leaflets are calcified as well. Mild bilateral gynecomastia. Hepatobiliary: Few subcentimeter hypoattenuating foci in segment 4 are too small to fully characterize on CT  imaging but statistically likely benign (3/22). No concerning hepatic lesions. Smooth surface contour. The gallbladder contains multiple dependently layering calcified gallstones. No gallbladder wall thickening. Small amount of pericholecystic fluid, nonspecific and possibly tracking from the adjacent pancreas. No biliary ductal dilatation or visible calcified intraductal gallstones. Pancreas: Diffuse edematous thickening of the pancreatic parenchyma without focal hypoattenuation or organized collection. No pancreatic ductal dilatation. Extensive peripancreatic stranding and phlegmon with likely reactive intraperitoneal and retroperitoneal free fluid. Spleen: Normal in size without focal abnormality. Adrenals/Urinary Tract: Normal adrenal glands. Extensive renal vascular calcifications. Kidneys are otherwise unremarkable, without renal calculi, suspicious lesion, or hydronephrosis. Circumferential bladder wall thickening. Indentation of the posterior bladder wall by the enlarged prostate which demonstrates median lobe hypertrophy. Stomach/Bowel: Distal esophagus is unremarkable. The stomach is distended with ingested material. Mild thickening of the duodenum particularly segments 3 through 4 and of the proximal jejunum just distal to the ligament of Treitz. No intramural or extraluminal gas. More distal small bowel as in the normal appearance. A normal appendix is visualized. No colonic dilatation or wall thickening.  Vascular/Lymphatic: Atherosclerotic plaque within the normal caliber, tortuous aorta. No aneurysm or ectasia. Reactive adenopathy in the upper abdomen. Reproductive: Marked prostatomegaly. Slight asymmetric hypertrophy of the right seminal vesicle, nonspecific. Other: Small volume of intraperitoneal free fluid likely extending from the pancreas to gallbladder fossa and into the right pericolic gutter to layer dependently within the pelvis. No free intraperitoneal air. No bowel containing hernias. Musculoskeletal: Multilevel degenerative changes are present in the imaged portions of the spine. Mild S-shaped curvature of the thoracolumbar spine. Additional degenerative features are present in the SI joints and hips. IMPRESSION: 1. Findings consistent with acute interstitial edematous pancreatitis. No evidence of pancreatic necrosis or organized collection. Likely reactive free fluid seen both in the intraperitoneal and retroperitoneal spaces. 2. Mild thickening of the duodenum particularly segments 3 through 4 and of the proximal jejunum just distal to the ligament of Treitz, may reflect reactive change versus less likely enteritis. 3. Cholelithiasis without CT evidence of acute cholecystitis or CT evident choledocholithiasis. If there is clinical concern for biliary obstruction, right upper quadrant ultrasound or MRCP could be obtained. 4. Marked prostatomegaly. Indentation of the posterior bladder wall by the enlarged prostate which demonstrates median lobe hypertrophy. 5. Circumferential bladder wall thickening, likely secondary to chronic outlet obstruction from the enlarged prostate. Correlate with urinalysis to exclude superimposed cystitis. 6. Aortic Atherosclerosis (ICD10-I70.0). Electronically Signed   By: Lovena Le M.D.   On: 02/27/2019 05:24    Procedures Procedures (including critical care time)  Medications Ordered in ED Medications  iohexol (OMNIPAQUE) 300 MG/ML solution 100 mL (100 mLs  Intravenous Contrast Given 02/27/19 0452)  fentaNYL (SUBLIMAZE) injection 50 mcg (50 mcg Intravenous Given 02/27/19 0546)     Initial Impression / Assessment and Plan / ED Course  I have reviewed the triage vital signs and the nursing notes.  Pertinent labs & imaging results that were available during my care of the patient were reviewed by me and considered in my medical decision making (see chart for details).  83 year old male here with abdominal pain.  This began over the past 24 hours.  He reports a few episodes of nonbloody, nonbilious emesis.  No diarrhea.  No fever, chills, or upper respiratory symptoms.  He denies any chest pain or shortness of breath.  He is afebrile and nontoxic.  Tenderness in the periumbilical and epigastric region.  He does report some pain in the back but is nontender to palpation  in that area.  Plan for screening labs, urinalysis, CT scan.  Patient without any significant pain at present, he was given of fentanyl and 4 mg Zofran with EMS.  Patient's labs with transaminitis (AST 1195, ALT 843) and significantly elevated lipase 7000+.  CT scan is concerning for gallstone pancreatitis.  He does not have any signs of acute cholecystitis.  Results discussed with patient and his wife, they acknowledge understanding.  Pain is started to return so we will give another dose of fentanyl as he responded well to this initially.  Will admit for symptomatic control, IV hydration.  Would likely benefit from GI consultation in the morning, has never seen one prior to this.  COVID swab obtained.  Discussed with Dr. Clyde Lundborg-- will admit for ongoing care.  Final Clinical Impressions(s) / ED Diagnoses   Final diagnoses:  Gallstone pancreatitis  Non-intractable vomiting with nausea, unspecified vomiting type  Transaminitis    ED Discharge Orders    None       Garlon Hatchet, PA-C 02/27/19 0604    Ward, Layla Maw, DO 02/27/19 [phone]

## 2019-02-27 NOTE — Care Management (Signed)
This is a no charge note  Pending admission per PA, [name]  83 year old man with past medical history of hypertension, hyperlipidemia, diabetes mellitus, CAD, stent placement, CKD-3, who presents with nausea, vomiting, abdominal pain.  Patient was found to acute pancreatitis with lipase 7221.  Abnormal liver function (ALP 115, AST 1195, ALT 843, total bilirubin 2.6). CT of abdomen/pelbis showed acute interstitial edematous pancreatitis and cholelithiasis without CT evidence of acute cholecystitis. Pt is admitted to med-surg bed as inpt. Will get US-RUQ and triglyceride level.   Allen Costa, MD  [facility]   If 7PM-7AM, please contact night-coverage [email or web address] Password [email or web address] 02/27/2019, 6:03 AM

## 2019-02-27 NOTE — Consult Note (Signed)
[facility] Consult: 12:39 PM 02/27/2019  LOS: 0 days    Referring Provider: Dr Harvest Forest  Primary Care Physician:  Elwyn Reach, MD Primary Gastroenterologist:  Dr Henrene Pastor.  Seen once in 2018 for consideration of colonoscopy.      Reason for Consultation:  pancreatitis   HPI: [name] is a 83 y.o. male.  PMH CKD 3.  CAD, MI.  Chronic Plavix.  Htn.  Hld. NIDDM.   Colguard screening negative 08/2016.  Did not undergo colonoscopy  Patient stay and she is prescribed yesterday evening and developed acute pain across his abdomen.  It was not focal, not on the left or right.  There was some radiation into his back.  Nausea with only 1 episode of clear/nonbloody emesis.  Pain at its worst was 8-9/10, received fentanyl per EMS in route to the hospital at about 5 AM and no pain meds since.  Pain today is 3-4/10.  Patient had no prodrome of GI symptoms.  Normally has a good appetite.   Stable weight.  No heartburn, no indigestion.  No prior episodes of abdominal pain.  Note hypertension to as high as 191/105.  Heart rate in the 80s to 90s.  GERD oxygenation/saturations.  No fever.  Lipase 7221.  T bili 2.6.  Alk phos normal, 115.  AST/ALT 1195/843 Covid 19 negative.   Abd ultrasound: Cholelithiasis,  No cholecystitis.  4 mm CBD.  8 mm simple cyst in left lobe liver.   Dopplers normal. CTAP w CM: pancreatic edema.  Duodenal and jejunal thickening, ? Enteritis. Simple cholelithiasis.  Prostamegaly, urine bladder wall thick.       Patient does not consume alcoholic beverages at all.  He did as a young man but not for decades.  No new medications or adjustment in meds dosing.  Received Plavix this morning.  Lives with his wife in [place].  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 Family history negative for gallbladder, liver, pancreatic disease.  No cancers.  Did not really know his father's history.  His mother died when she was in her 70s of "old age"    Past Medical History:  Diagnosis Date  . Acute MI, subendocardial ([place])   . Coronary artery disease   . HCD (hypertensive cardiovascular disease)   . Hyperlipidemia   . IHD (ischemic heart disease)     Past Surgical History:  Procedure Laterality Date  . CARDIAC CATHETERIZATION  01/02/2007   EF 50%  . CORONARY STENT PLACEMENT     LEFT CIRCUMFLEX    Prior to Admission medications   Medication Sig Start Date End Date Taking? Authorizing Provider  acetaminophen (TYLENOL) 500 MG tablet Take 1,000 mg by mouth every 6 (six) hours as needed for mild pain, moderate pain, fever or headache.   Yes [provider]  clopidogrel (PLAVIX) 75 MG tablet Take 1 tablet (75 mg total) by mouth daily. 09/15/15  Yes Darlin Coco, MD  ezetimibe (ZETIA) 10 MG tablet Take 1 tablet (10 mg total) by  mouth daily. 08/09/17  Yes Josue Hector, MD  metFORMIN (GLUCOPHAGE) 500 MG tablet Take 1 tablet (500 mg total) by mouth daily with breakfast. Patient taking differently: Take 500 mg by mouth 2 (two) times daily with a meal.  09/15/15  Yes Josue Hector, MD  nitroGLYCERIN (NITROSTAT) 0.4 MG SL tablet Place 0.4 mg under the tongue every 5 (five) minutes as needed for chest pain.   Yes [provider]  rosuvastatin (CRESTOR) 40 MG tablet Take 1 tablet (40 mg total) by mouth daily. 08/09/17 02/27/19 Yes Josue Hector, MD    Scheduled Meds: . amLODipine  5 mg Oral Daily  . clopidogrel  75 mg Oral Daily  . enoxaparin (LOVENOX) injection  40 mg Subcutaneous Daily  . insulin aspart  0-9 Units Subcutaneous TID WC  . metoprolol succinate  50 mg Oral Daily   Infusions: . sodium chloride 125 mL/hr at 02/27/19 0743   PRN Meds: albuterol, hydrALAZINE, morphine injection, ondansetron (ZOFRAN) IV    Allergies as of 02/27/2019  . (No Known Allergies)    Family History  Problem Relation Age of Onset  . Heart disease Mother   . Healthy Father        NEG HX    Social History   Socioeconomic History  . Marital status: Married    Spouse name: Not on file  . Number of children: Not on file  . Years of education: Not on file  . Highest education level: Not on file  Occupational History  . Not on file  Social Needs  . Financial resource strain: Not hard at all  . Food insecurity    Worry: Never true    Inability: Never true  . Transportation needs    Medical: No    Non-medical: No  Tobacco Use  . Smoking status: Former Smoker    Quit date: 02/02/1985    Years since quitting: 34.0  . Smokeless tobacco: Never Used  Substance and Sexual Activity  . Alcohol use: No  . Drug use: No  . Sexual activity: Not on file  Lifestyle  . Physical activity    Days per week: 3 days    Minutes per session: 30 min  . Stress: Only a little  Relationships  . Social Herbalist on phone: Not on file    Gets together: Not on file    Attends religious service: Not on file    Active member of club or organization: Not on file    Attends meetings of clubs or organizations: Not on file    Relationship status: Not on file  . Intimate partner violence    Fear of current or ex partner: Not on file    Emotionally abused: Not on file    Physically abused: Not on file    Forced sexual activity: Not on file  Other Topics Concern  . Not on file  Social History Narrative  . Not on file    REVIEW OF SYSTEMS: Constitutional: Feels well.  No fatigue, no weakness. ENT:  No nose bleeds Pulm: No coughing, no shortness of breath, no pleuritic pain. CV:  No palpitations, no LE edema.  No anginal symptoms. GU:  No hematuria, no frequency, no dark-colored urine. GI: See HPI. Heme: No excessive or unusual bleeding or bruising Transfusions: None ever. Neuro:  No headaches, no peripheral  tingling or numbness.  No syncope, no seizures. Derm:  No itching, no rash or sores.  Endocrine:  No  sweats or chills.  No polyuria or dysuria Immunization: Unknown. Travel:  None for several months.    PHYSICAL EXAM: Vital signs in last 24 hours: Vitals:   02/27/19 1000 02/27/19 1107  BP: (!) 189/112 (!) 163/87  Pulse: 91 80  Resp:  20  Temp:  98.6 F (37 C)  SpO2: 99% 98%   Wt Readings from Last 3 Encounters:  11/30/17 88.9 kg  08/09/17 95.3 kg  08/06/16 95.3 kg    General: Pleasant, looks well.  Not frail, not ill looking.  Comfortable. Head: No facial asymmetry or swelling.  No signs of head trauma. Eyes: No scleral icterus.  No conjunctival pallor.  Arcus senilis. Ears: Not hard of hearing. Nose: No discharge or congestion. Mouth: Very few teeth.  Mucosa is pink, moist, clear.  Tongue midline. Neck: No mass, no thyromegaly, no JVD. Lungs: Clear bilaterally without labored breathing or cough. Heart: RRR.  No MRG.  S1, S2 present. Abdomen: Soft.  Not tender, not distended.  Active bowel sounds.  No HSM, masses, bruits, hernias.   Rectal: Deferred Musc/Skeltl: No joint redness, swelling or gross deformity. Extremities: No CCE. Neurologic: Moves all 4 limbs, grossly full strength.  No tremor.  Alert and oriented x3. Skin: No rash, no sores, no suspicious lesions. Tattoos: None observed Nodes: No cervical adenopathy Psych: Calm, pleasant, cooperative.  Intake/Output from previous day: No intake/output data recorded. Intake/Output this shift: No intake/output data recorded.  LAB RESULTS: Recent Labs    02/27/19 0239  WBC 11.7*  HGB 14.6  HCT 47.0  PLT 179   BMET Lab Results  Component Value Date   NA 140 02/27/2019   NA 139 12/01/2017   NA 139 11/30/2017   K 3.8 02/27/2019   K 4.7 12/01/2017   K 3.2 (L) 11/30/2017   CL 104 02/27/2019   CL 106 12/01/2017   CL 106 11/30/2017   CO2 21 (L) 02/27/2019   CO2 22 12/01/2017   CO2 28 11/30/2017   GLUCOSE  173 (H) 02/27/2019   GLUCOSE 150 (H) 12/01/2017   GLUCOSE 121 (H) 11/30/2017   BUN 10 02/27/2019   BUN 18 12/01/2017   BUN 20 11/30/2017   CREATININE 1.18 02/27/2019   CREATININE 1.61 (H) 12/01/2017   CREATININE 1.53 (H) 11/30/2017   CALCIUM 9.4 02/27/2019   CALCIUM 8.8 (L) 12/01/2017   CALCIUM 8.9 11/30/2017   LFT Recent Labs    02/27/19 0239  PROT 7.1  ALBUMIN 3.8  AST 1,195*  ALT 843*  ALKPHOS 115  BILITOT 2.6*   PT/INR Lab Results  Component Value Date   INR 1.0 02/27/2019   INR 0.9 12/31/2006   Hepatitis Panel No results for input(s): HEPBSAG, HCVAB, HEPAIGM, HEPBIGM in the last 72 hours. C-Diff No components found for: CDIFF Lipase     Component Value Date/Time   LIPASE 7,221 (H) 02/27/2019 0239    Drugs of Abuse  No results found for: LABOPIA, COCAINSCRNUR, LABBENZ, AMPHETMU, THCU, LABBARB   RADIOLOGY STUDIES: Ct Abdomen Pelvis W Contrast  Result Date: 02/27/2019 CLINICAL DATA:  Acute generalized abdominal pain, localized periumbilical EXAM: CT ABDOMEN AND PELVIS WITH CONTRAST TECHNIQUE: Multidetector CT imaging of the abdomen and pelvis was performed using the standard protocol following bolus administration of intravenous contrast. CONTRAST:  157m OMNIPAQUE IOHEXOL 300 MG/ML  SOLN COMPARISON:  Renal ultrasound November 29, 2017 FINDINGS: Lower chest: Basilar areas of subsegmental atelectasis and/or scarring. Cardiac size is upper limits normal. Extensive atherosclerotic calcifications of the coronary arteries. Aortic leaflets  are calcified as well. Mild bilateral gynecomastia. Hepatobiliary: Few subcentimeter hypoattenuating foci in segment 4 are too small to fully characterize on CT imaging but statistically likely benign (3/22). No concerning hepatic lesions. Smooth surface contour. The gallbladder contains multiple dependently layering calcified gallstones. No gallbladder wall thickening. Small amount of pericholecystic fluid, nonspecific and possibly tracking  from the adjacent pancreas. No biliary ductal dilatation or visible calcified intraductal gallstones. Pancreas: Diffuse edematous thickening of the pancreatic parenchyma without focal hypoattenuation or organized collection. No pancreatic ductal dilatation. Extensive peripancreatic stranding and phlegmon with likely reactive intraperitoneal and retroperitoneal free fluid. Spleen: Normal in size without focal abnormality. Adrenals/Urinary Tract: Normal adrenal glands. Extensive renal vascular calcifications. Kidneys are otherwise unremarkable, without renal calculi, suspicious lesion, or hydronephrosis. Circumferential bladder wall thickening. Indentation of the posterior bladder wall by the enlarged prostate which demonstrates median lobe hypertrophy. Stomach/Bowel: Distal esophagus is unremarkable. The stomach is distended with ingested material. Mild thickening of the duodenum particularly segments 3 through 4 and of the proximal jejunum just distal to the ligament of Treitz. No intramural or extraluminal gas. More distal small bowel as in the normal appearance. A normal appendix is visualized. No colonic dilatation or wall thickening. Vascular/Lymphatic: Atherosclerotic plaque within the normal caliber, tortuous aorta. No aneurysm or ectasia. Reactive adenopathy in the upper abdomen. Reproductive: Marked prostatomegaly. Slight asymmetric hypertrophy of the right seminal vesicle, nonspecific. Other: Small volume of intraperitoneal free fluid likely extending from the pancreas to gallbladder fossa and into the right pericolic gutter to layer dependently within the pelvis. No free intraperitoneal air. No bowel containing hernias. Musculoskeletal: Multilevel degenerative changes are present in the imaged portions of the spine. Mild S-shaped curvature of the thoracolumbar spine. Additional degenerative features are present in the SI joints and hips. IMPRESSION: 1. Findings consistent with acute interstitial edematous  pancreatitis. No evidence of pancreatic necrosis or organized collection. Likely reactive free fluid seen both in the intraperitoneal and retroperitoneal spaces. 2. Mild thickening of the duodenum particularly segments 3 through 4 and of the proximal jejunum just distal to the ligament of Treitz, may reflect reactive change versus less likely enteritis. 3. Cholelithiasis without CT evidence of acute cholecystitis or CT evident choledocholithiasis. If there is clinical concern for biliary obstruction, right upper quadrant ultrasound or MRCP could be obtained. 4. Marked prostatomegaly. Indentation of the posterior bladder wall by the enlarged prostate which demonstrates median lobe hypertrophy. 5. Circumferential bladder wall thickening, likely secondary to chronic outlet obstruction from the enlarged prostate. Correlate with urinalysis to exclude superimposed cystitis. 6. Aortic Atherosclerosis (ICD10-I70.0). Electronically Signed   By: Lovena Le M.D.   On: 02/27/2019 05:24   US Abdomen Limited Ruq  Result Date: 02/27/2019 CLINICAL DATA:  Acute pancreatitis.  Gallstones. EXAM: ULTRASOUND ABDOMEN LIMITED RIGHT UPPER QUADRANT COMPARISON:  CT of the abdomen pelvis 02/27/19. FINDINGS: Gallbladder: Multiple shadowing stones are present in the gallbladder. The largest and measures 1.3 cm. Wall thickness is within normal limits at 2.0 mm. There is no sonographic Murphy sign. Common bile duct: Diameter: 4.0 mm, within normal limits Liver: An 8 mm simple cyst in the left lobe of the liver is stable. No other focal lesions are present. Normal echogenicity is present. Portal vein is patent on color Doppler imaging with normal direction of blood flow towards the liver. Other: None. IMPRESSION: 1. Cholelithiasis without cholecystitis. Electronically Signed   By: San Morelle M.D.   On: 02/27/2019 07:33     IMPRESSION:   *     Acute  pancreatitis.  Symptomatically much improved within less than 24 hours.  Non-complicated gallstones evident on ultrasound and CT.  However bile ducts normal.  Does not consume alcoholic beverages. ? Biliary pancreatitis: ducts normal but marked transaminitis.    ? Ischemic pancreatitis  No hx trauma, injury to abdomen.   Home meds in place for a long time.  However Zetia can cause pancreatitis.  *     CAD, sub endocardial MI, DES to L cfx 2008.  On chronic Plavix, latest dose this AM  *     Prostatomegaly, bladder wall thickening on CT, incidental findings.  Patient denies frequency, nocturia, urgency, dysuria, slow urinary stream.    PLAN:     *     In anticipation that pt may require some sort of intervention, I have discontinued the Plavix.    *   Dr Tarri Glenn to follow   Azucena Freed  02/27/2019, 12:39 PM Phone [phone]

## 2019-02-27 NOTE — ED Notes (Addendum)
Patient transported to CT 

## 2019-02-27 NOTE — H&P (Addendum)
History and Physical    [name] NHA:[id number] DOB: 1935/10/04 DOA: 02/27/2019  Referring MD/NP/PA: Lorretta Harp, MD PCP: Rometta Emery, MD  Patient coming from: Home via EMS  Chief Complaint: Abdominal pain  I have personally briefly reviewed patient's old medical records in [facility]   HPI: [name] is a 83 y.o. male with medical history significant of hypertension, HLD, and CAD s/p stent; who presents with complaints of abdominal pain starting yesterday afternoon.  Patient had been in his normal state of health otherwise.  He reported having breakfast at [employer].  Describes it as a sharp and achy pain that progressively worsened as the day went on.  Reports it is mainly located in the middle of his abdomen, but also radiates to his back.  He reports having nausea and one episode of vomiting.  Emesis was noted to be nonbloody and nonbilious in appearance.  He does not drink alcohol and quit smoking over 30 years ago.  Due to symptoms he reported having decreased appetite.  Denies having any significant fever, cough, shortness of breath, recent sick contacts, or prior history of abdominal surgeries.  ED Course: On admission into the emergency department patient was noted to be afebrile, blood pressure elevated up to 160/90, and all other vital signs maintained.  Labs revealed WBC 11.7, lipase 7221, AST 1195, ALT 843, total bilirubin 2.6, and triglycerides 57.  CT scan of the abdomen and pelvis revealed acute interstitial edematous pancreatitis with cholelithiasis without signs of cholecystitis or obstruction.  Patient was given fentanyl, Zofran, and started on normal saline IV fluids 125 mL/h.  Review of Systems  Constitutional: Positive for malaise/fatigue. Negative for fever.  HENT: Negative for congestion and nosebleeds.   Eyes: Negative for photophobia and pain.  Respiratory: Negative for cough and shortness of breath.   Cardiovascular: Negative for chest pain  and leg swelling.  Gastrointestinal: Positive for abdominal pain.  Genitourinary: Negative for dysuria and hematuria.  Musculoskeletal: Positive for back pain. Negative for falls.  Skin: Negative for itching and rash.  Neurological: Negative for focal weakness and loss of consciousness.  Psychiatric/Behavioral: Negative for memory loss and substance abuse.    Past Medical History:  Diagnosis Date   Acute MI, subendocardial (HCC)    Coronary artery disease    HCD (hypertensive cardiovascular disease)    Hyperlipidemia    IHD (ischemic heart disease)     Past Surgical History:  Procedure Laterality Date   CARDIAC CATHETERIZATION  01/02/2007   EF 50%   CORONARY STENT PLACEMENT     LEFT CIRCUMFLEX     reports that he quit smoking about 34 years ago. He has never used smokeless tobacco. He reports that he does not drink alcohol or use drugs.  No Known Allergies  Family History  Problem Relation Age of Onset   Healthy Mother        NEG HX   Healthy Father        NEG HX    Prior to Admission medications   Medication Sig Start Date End Date Taking? Authorizing Provider  acetaminophen (TYLENOL) 500 MG tablet Take 1,000 mg by mouth every 6 (six) hours as needed for mild pain, moderate pain, fever or headache.    [provider]  amLODipine (NORVASC) 5 MG tablet Take 1 tablet (5 mg total) by mouth daily. 12/01/17 12/31/17  Arrien, York Ram, MD  clopidogrel (PLAVIX) 75 MG tablet Take 1 tablet (75 mg total) by mouth daily. 09/15/15  Cassell ClementBrackbill, Thomas, MD  ezetimibe (ZETIA) 10 MG tablet Take 1 tablet (10 mg total) by mouth daily. 08/09/17   Wendall StadeNishan, Peter C, MD  metFORMIN (GLUCOPHAGE) 500 MG tablet Take 1 tablet (500 mg total) by mouth daily with breakfast. 09/15/15   Wendall StadeNishan, Peter C, MD  metoprolol succinate (TOPROL-XL) 50 MG 24 hr tablet Take 1 tablet (50 mg total) by mouth daily. Take with or immediately following a meal. 09/12/15   Cassell ClementBrackbill, Thomas, MD   nitroGLYCERIN (NITROSTAT) 0.4 MG SL tablet Place 0.4 mg under the tongue every 5 (five) minutes as needed for chest pain.    [provider]  rosuvastatin (CRESTOR) 40 MG tablet Take 1 tablet (40 mg total) by mouth daily. 08/09/17 11/29/17  Wendall StadeNishan, Peter C, MD    Physical Exam:  Constitutional: Elderly male currently in NAD, calm, comfortable Vitals:   02/27/19 0345 02/27/19 0430 02/27/19 0530 02/27/19 0700  BP: 140/89 (!) 142/83 (!) 153/84 (!) 160/90  Pulse: 98 96 94 97  Resp: (!) 22 17 18 19   Temp:      TempSrc:      SpO2: 93% 97% 98% 98%   Eyes: PERRL, lids and conjunctivae normal ENMT: Mucous membranes are moist. Posterior pharynx clear of any exudate or lesions. Neck: normal, supple, no masses, no thyromegaly Respiratory: clear to auscultation bilaterally, no wheezing, no crackles. Normal respiratory effort. No accessory muscle use.  Cardiovascular: Regular rate and rhythm, no murmurs / rubs / gallops. No extremity edema. 2+ pedal pulses. No carotid bruits.  Abdomen: no tenderness, no masses palpated. No hepatosplenomegaly. Bowel sounds positive.  Musculoskeletal: no clubbing / cyanosis. No joint deformity upper and lower extremities. Good ROM, no contractures. Normal muscle tone.  Skin: no rashes, lesions, ulcers. No induration Neurologic: CN 2-12 grossly intact. Sensation intact, DTR normal. Strength 5/5 in all 4.  Psychiatric: Normal judgment and insight. Alert and oriented x 3. Normal mood.     Labs on Admission: I have personally reviewed following labs and imaging studies  CBC: Recent Labs  Lab 02/27/19 0239  WBC 11.7*  NEUTROABS 10.0*  HGB 14.6  HCT 47.0  MCV 86.1  PLT 179   Basic Metabolic Panel: Recent Labs  Lab 02/27/19 0239  NA 140  K 3.8  CL 104  CO2 21*  GLUCOSE 173*  BUN 10  CREATININE 1.18  CALCIUM 9.4   GFR: CrCl cannot be calculated (Unknown ideal weight.). Liver Function Tests: Recent Labs  Lab 02/27/19 0239  AST 1,195*  ALT  843*  ALKPHOS 115  BILITOT 2.6*  PROT 7.1  ALBUMIN 3.8   Recent Labs  Lab 02/27/19 0239  LIPASE 7,221*   No results for input(s): AMMONIA in the last 168 hours. Coagulation Profile: Recent Labs  Lab 02/27/19 0616  INR 1.0   Cardiac Enzymes: No results for input(s): CKTOTAL, CKMB, CKMBINDEX, TROPONINI in the last 168 hours. BNP (last 3 results) No results for input(s): PROBNP in the last 8760 hours. HbA1C: No results for input(s): HGBA1C in the last 72 hours. CBG: No results for input(s): GLUCAP in the last 168 hours. Lipid Profile: Recent Labs    02/27/19 0616  TRIG 57   Thyroid Function Tests: No results for input(s): TSH, T4TOTAL, FREET4, T3FREE, THYROIDAB in the last 72 hours. Anemia Panel: No results for input(s): VITAMINB12, FOLATE, FERRITIN, TIBC, IRON, RETICCTPCT in the last 72 hours. Urine analysis:    Component Value Date/Time   COLORURINE YELLOW 11/29/2017 1647   APPEARANCEUR HAZY (A) 11/29/2017 1647  LABSPEC 1.015 11/29/2017 1647   PHURINE 5.0 11/29/2017 1647   GLUCOSEU NEGATIVE 11/29/2017 1647   HGBUR MODERATE (A) 11/29/2017 1647   BILIRUBINUR NEGATIVE 11/29/2017 1647   KETONESUR NEGATIVE 11/29/2017 1647   PROTEINUR 30 (A) 11/29/2017 1647   NITRITE POSITIVE (A) 11/29/2017 1647   LEUKOCYTESUR SMALL (A) 11/29/2017 1647   Sepsis Labs: No results found for this or any previous visit (from the past 240 hour(s)).   Radiological Exams on Admission: Ct Abdomen Pelvis W Contrast  Result Date: 02/27/2019 CLINICAL DATA:  Acute generalized abdominal pain, localized periumbilical EXAM: CT ABDOMEN AND PELVIS WITH CONTRAST TECHNIQUE: Multidetector CT imaging of the abdomen and pelvis was performed using the standard protocol following bolus administration of intravenous contrast. CONTRAST:  100mL OMNIPAQUE IOHEXOL 300 MG/ML  SOLN COMPARISON:  Renal ultrasound November 29, 2017 FINDINGS: Lower chest: Basilar areas of subsegmental atelectasis and/or scarring. Cardiac  size is upper limits normal. Extensive atherosclerotic calcifications of the coronary arteries. Aortic leaflets are calcified as well. Mild bilateral gynecomastia. Hepatobiliary: Few subcentimeter hypoattenuating foci in segment 4 are too small to fully characterize on CT imaging but statistically likely benign (3/22). No concerning hepatic lesions. Smooth surface contour. The gallbladder contains multiple dependently layering calcified gallstones. No gallbladder wall thickening. Small amount of pericholecystic fluid, nonspecific and possibly tracking from the adjacent pancreas. No biliary ductal dilatation or visible calcified intraductal gallstones. Pancreas: Diffuse edematous thickening of the pancreatic parenchyma without focal hypoattenuation or organized collection. No pancreatic ductal dilatation. Extensive peripancreatic stranding and phlegmon with likely reactive intraperitoneal and retroperitoneal free fluid. Spleen: Normal in size without focal abnormality. Adrenals/Urinary Tract: Normal adrenal glands. Extensive renal vascular calcifications. Kidneys are otherwise unremarkable, without renal calculi, suspicious lesion, or hydronephrosis. Circumferential bladder wall thickening. Indentation of the posterior bladder wall by the enlarged prostate which demonstrates median lobe hypertrophy. Stomach/Bowel: Distal esophagus is unremarkable. The stomach is distended with ingested material. Mild thickening of the duodenum particularly segments 3 through 4 and of the proximal jejunum just distal to the ligament of Treitz. No intramural or extraluminal gas. More distal small bowel as in the normal appearance. A normal appendix is visualized. No colonic dilatation or wall thickening. Vascular/Lymphatic: Atherosclerotic plaque within the normal caliber, tortuous aorta. No aneurysm or ectasia. Reactive adenopathy in the upper abdomen. Reproductive: Marked prostatomegaly. Slight asymmetric hypertrophy of the right  seminal vesicle, nonspecific. Other: Small volume of intraperitoneal free fluid likely extending from the pancreas to gallbladder fossa and into the right pericolic gutter to layer dependently within the pelvis. No free intraperitoneal air. No bowel containing hernias. Musculoskeletal: Multilevel degenerative changes are present in the imaged portions of the spine. Mild S-shaped curvature of the thoracolumbar spine. Additional degenerative features are present in the SI joints and hips. IMPRESSION: 1. Findings consistent with acute interstitial edematous pancreatitis. No evidence of pancreatic necrosis or organized collection. Likely reactive free fluid seen both in the intraperitoneal and retroperitoneal spaces. 2. Mild thickening of the duodenum particularly segments 3 through 4 and of the proximal jejunum just distal to the ligament of Treitz, may reflect reactive change versus less likely enteritis. 3. Cholelithiasis without CT evidence of acute cholecystitis or CT evident choledocholithiasis. If there is clinical concern for biliary obstruction, right upper quadrant ultrasound or MRCP could be obtained. 4. Marked prostatomegaly. Indentation of the posterior bladder wall by the enlarged prostate which demonstrates median lobe hypertrophy. 5. Circumferential bladder wall thickening, likely secondary to chronic outlet obstruction from the enlarged prostate. Correlate with urinalysis to exclude superimposed  cystitis. 6. Aortic Atherosclerosis (ICD10-I70.0). Electronically Signed   By: Lovena Le M.D.   On: 02/27/2019 05:24    EKG: Independently reviewed.  Sinus rhythm at 96 bpm with degree heart block and premature atrial complex  Assessment/Plan Pancreatitis: Acute.  Patient presents with complaints of abdominal pain with nausea and vomiting.  Lipase 7221 with elevated liver enzymes.  With no significant history of alcohol use and triglycerides within normal limits.  CT scan of the abdomen revealing  cholelithiasis without signs of acute obstruction.  Suspect biliary cause at this time for patient's symptoms. -Admit to a MedSurg bed -Continue normal saline IV fluids at 125 mL/h -Pain control -Follow-up right upper quadrant ultrasound -GI consulted, we will follow-up for further recommendations  Leukocytosis: Acute.  On admission WBC elevated up to 11.7.  Suspect secondary to above.  Low suspicion for COVID-19. -Follow-up COVID-19 screening -Recheck CBC in a.m.   Cholelithiasis: Seen above with no no acute signs of obstruction or cholecystitis.  However the elevated liver studies are concerning for possible obstruction. -Follow-up right upper quadrant ultrasound -May warrant surgery consultation for cholecystectomy  Nausea and vomiting: Likely secondary to above. -Antiemetics as needed -Clear liquid diet and advance as tolerated  Elevated liver enzymes/hyperbilirubinemia: Acute.  Likely secondary to above.  -Continue to trend liver enzymes  Essential hypertension -Continue amlodipine and metoprolol  Coronary artery disease -Hold Plavix  Diabetes mellitus type 2: Patient appears to be relatively well controlled currently only taking oral medications of metformin.  Last available hemoglobin A1c on file was 6.8 from 07/17/2015. -Hypoglycemic protocol -Hold metformin -CBGs q. before meals with sensitive SSI  Hyperlipidemia: Patient normally on Zetia and Crestor.  Triglyceride levels noted to be within normal limits. -Held due to elevated liver enzymes   DVT prophylaxis: Lovenox Code Status: Full Family Communication: Discussed plan of care with the patient family present at bedside Disposition Plan: Possible discharge home in 2 to 3 days Consults called: GI/general surgery Admission status: Inpatient  Norval Morton MD [facility] Pager [phone]   If 7PM-7AM, please contact night-coverage [email or web address] Password [email or web address]  02/27/2019, 7:15 AM

## 2019-02-27 NOTE — ED Triage Notes (Signed)
Patient from home, complaining of abdominal pain, acute onset, with nausea and vomiting.  Patient is pale, diaphoretic and lethargic when answering questions.  No chest pain, no shortness of breath.  His pain is from umbilicus down.  Patient was given 159mcg of fentanyl total.

## 2019-02-27 NOTE — Consult Note (Signed)
[facility] Surgery Consult Note  [name] 1935/10/17  [id number].    Requesting MD: Orvan Falconer Chief Complaint/Reason for Consult: pancreatitis with cholelithiasis HPI:  Patient is an 83 year old male who presented to ED with abdominal pain, n/v. He reports abdominal pain began yesterday in periumbilical abdomen and then became more generalized and radiated to back. Pain characterized as "heaviness". Patient reported associated nausea with NBNB emesis x1. +flatus and had a normal BM yesterday AM. Currently pain is improving, nausea resolved. Patient tolerating CLD. PMH significant for HTN, HLD, GERD, Hx of CAD s/p stent. Takes plavix, last dose yesterday. No past abdominal surgery. Denies alcohol, tobacco or illicit drug use. Patient is retired. NKDA.   ROS: Review of Systems  Constitutional: Positive for diaphoresis. Negative for chills and fever.  Respiratory: Negative for cough, shortness of breath and wheezing.   Cardiovascular: Negative for chest pain and palpitations.  Gastrointestinal: Positive for abdominal pain, nausea and vomiting. Negative for blood in stool, constipation, diarrhea and melena.  Genitourinary: Negative for dysuria, frequency and urgency.  Musculoskeletal: Positive for back pain.  All other systems reviewed and are negative.   Family History  Problem Relation Age of Onset  . Heart disease Mother   . Healthy Father        NEG HX    Past Medical History:  Diagnosis Date  . Acute MI, subendocardial (HCC)   . Coronary artery disease 2008   DES placed to left cfx.    Marland Kitchen HCD (hypertensive cardiovascular disease)   . Hyperlipidemia     Past Surgical History:  Procedure Laterality Date  . CARDIAC CATHETERIZATION  01/02/2007   EF 50%  . CORONARY STENT PLACEMENT     LEFT CIRCUMFLEX    Social History:  reports that he quit smoking about 34 years ago. He has never used smokeless tobacco. He reports that he does not drink alcohol or use  drugs.  Allergies: No Known Allergies  Medications Prior to Admission  Medication Sig Dispense Refill  . acetaminophen (TYLENOL) 500 MG tablet Take 1,000 mg by mouth every 6 (six) hours as needed for mild pain, moderate pain, fever or headache.    . clopidogrel (PLAVIX) 75 MG tablet Take 1 tablet (75 mg total) by mouth daily. 30 tablet 9  . ezetimibe (ZETIA) 10 MG tablet Take 1 tablet (10 mg total) by mouth daily. 30 tablet 11  . metFORMIN (GLUCOPHAGE) 500 MG tablet Take 1 tablet (500 mg total) by mouth daily with breakfast. (Patient taking differently: Take 500 mg by mouth 2 (two) times daily with a meal. ) 30 tablet 1  . nitroGLYCERIN (NITROSTAT) 0.4 MG SL tablet Place 0.4 mg under the tongue every 5 (five) minutes as needed for chest pain.    . rosuvastatin (CRESTOR) 40 MG tablet Take 1 tablet (40 mg total) by mouth daily. 30 tablet 11    Blood pressure (!) 163/87, pulse 80, temperature 98.6 F (37 C), temperature source Oral, resp. rate 20, SpO2 98 %. Physical Exam: Physical Exam Constitutional:      General: He is not in acute distress.    Appearance: He is well-developed. He is not toxic-appearing.  HENT:     Head: Normocephalic and atraumatic.     Mouth/Throat:     Mouth: Mucous membranes are moist.     Pharynx: Oropharynx is clear.  Eyes:     General: No scleral icterus.    Extraocular Movements: Extraocular movements intact.     Pupils: Pupils are  equal, round, and reactive to light.  Cardiovascular:     Rate and Rhythm: Normal rate and regular rhythm.     Heart sounds: Normal heart sounds.  Pulmonary:     Effort: Pulmonary effort is normal.     Breath sounds: Normal breath sounds.  Abdominal:     General: Bowel sounds are normal. There is distension (mild).     Palpations: Abdomen is soft. There is no hepatomegaly or splenomegaly.     Tenderness: There is no abdominal tenderness. There is no guarding or rebound.     Hernia: No hernia is present.  Skin:    General:  Skin is warm and dry.  Neurological:     General: No focal deficit present.     Mental Status: He is alert and oriented to person, place, and time.  Psychiatric:        Mood and Affect: Mood normal.        Behavior: Behavior normal.     Results for orders placed or performed during the hospital encounter of 02/27/19 (from the past 48 hour(s))  CBC with Differential     Status: Abnormal   Collection Time: 02/27/19  2:39 AM  Result Value Ref Range   WBC 11.7 (H) 4.0 - 10.5 K/uL   RBC 5.46 4.22 - 5.81 MIL/uL   Hemoglobin 14.6 13.0 - 17.0 g/dL   HCT 16.147.0 09.639.0 - 04.552.0 %   MCV 86.1 80.0 - 100.0 fL   MCH 26.7 26.0 - 34.0 pg   MCHC 31.1 30.0 - 36.0 g/dL   RDW 40.914.0 81.111.5 - 91.415.5 %   Platelets 179 150 - 400 K/uL   nRBC 0.0 0.0 - 0.2 %   Neutrophils Relative % 87 %   Neutro Abs 10.0 (H) 1.7 - 7.7 K/uL   Lymphocytes Relative 3 %   Lymphs Abs 0.4 (L) 0.7 - 4.0 K/uL   Monocytes Relative 10 %   Monocytes Absolute 1.2 (H) 0.1 - 1.0 K/uL   Eosinophils Relative 0 %   Eosinophils Absolute 0.0 0.0 - 0.5 K/uL   Basophils Relative 0 %   Basophils Absolute 0.0 0.0 - 0.1 K/uL   Immature Granulocytes 0 %   Abs Immature Granulocytes 0.05 0.00 - 0.07 K/uL    Comment: Performed at [facility], [address] [place], [place] [address]  Comprehensive metabolic panel     Status: Abnormal   Collection Time: 02/27/19  2:39 AM  Result Value Ref Range   Sodium 140 135 - 145 mmol/L   Potassium 3.8 3.5 - 5.1 mmol/L   Chloride 104 98 - 111 mmol/L   CO2 21 (L) 22 - 32 mmol/L   Glucose, Bld 173 (H) 70 - 99 mg/dL   BUN 10 8 - 23 mg/dL   Creatinine, Ser 6.211.18 0.61 - 1.24 mg/dL   Calcium 9.4 8.9 - 30.810.3 mg/dL   Total Protein 7.1 6.5 - 8.1 g/dL   Albumin 3.8 3.5 - 5.0 g/dL   AST 6,5781,195 (H) 15 - 41 U/L   ALT 843 (H) 0 - 44 U/L   Alkaline Phosphatase 115 38 - 126 U/L   Total Bilirubin 2.6 (H) 0.3 - 1.2 mg/dL   GFR calc non Af Amer 58 (L) >60 mL/min   GFR calc Af Amer >60 >60 mL/min   Anion gap 15 5 - 15     Comment: Performed at [facility], [address] [place], [place] [address]  Lipase, blood  Status: Abnormal   Collection Time: 02/27/19  2:39 AM  Result Value Ref Range   Lipase 7,221 (H) 11 - 51 U/L    Comment: RESULTS CONFIRMED BY MANUAL DILUTION Performed at [facility], [address] [place], [place] [address]   Urinalysis, Routine w reflex microscopic     Status: Abnormal   Collection Time: 02/27/19  2:39 AM  Result Value Ref Range   Color, Urine YELLOW YELLOW   APPearance CLEAR CLEAR   Specific Gravity, Urine 1.032 (H) 1.005 - 1.030   pH 5.0 5.0 - 8.0   Glucose, UA NEGATIVE NEGATIVE mg/dL   Hgb urine dipstick SMALL (A) NEGATIVE   Bilirubin Urine NEGATIVE NEGATIVE   Ketones, ur NEGATIVE NEGATIVE mg/dL   Protein, ur 30 (A) NEGATIVE mg/dL   Nitrite NEGATIVE NEGATIVE   Leukocytes,Ua NEGATIVE NEGATIVE   RBC / HPF 0-5 0 - 5 RBC/hpf   WBC, UA 0-5 0 - 5 WBC/hpf   Bacteria, UA NONE SEEN NONE SEEN   Squamous Epithelial / LPF 0-5 0 - 5    Comment: Performed at [facility], [address], [place], [place] [address]  SARS Coronavirus 2 [facility] order, Performed in [facility] hospital lab) Nasopharyngeal Nasopharyngeal Swab     Status: None   Collection Time: 02/27/19  5:49 AM   Specimen: Nasopharyngeal Swab  Result Value Ref Range   SARS Coronavirus 2 NEGATIVE NEGATIVE    Comment: (NOTE) If result is NEGATIVE SARS-CoV-2 target nucleic acids are NOT DETECTED. The SARS-CoV-2 RNA is generally detectable in upper and lower  respiratory specimens during the acute phase of infection. The lowest  concentration of SARS-CoV-2 viral copies this assay can detect is 250  copies / mL. A negative result does not preclude SARS-CoV-2 infection  and should not be used as the sole basis for treatment or other  patient management decisions.  A negative result may occur with  improper specimen collection / handling, submission of specimen other  than nasopharyngeal  swab, presence of viral mutation(s) within the  areas targeted by this assay, and inadequate number of viral copies  (<250 copies / mL). A negative result must be combined with clinical  observations, patient history, and epidemiological information. If result is POSITIVE SARS-CoV-2 target nucleic acids are DETECTED. The SARS-CoV-2 RNA is generally detectable in upper and lower  respiratory specimens dur ing the acute phase of infection.  Positive  results are indicative of active infection with SARS-CoV-2.  Clinical  correlation with patient history and other diagnostic information is  necessary to determine patient infection status.  Positive results do  not rule out bacterial infection or co-infection with other viruses. If result is PRESUMPTIVE POSTIVE SARS-CoV-2 nucleic acids MAY BE PRESENT.   A presumptive positive result was obtained on the submitted specimen  and confirmed on repeat testing.  While 2019 novel coronavirus  (SARS-CoV-2) nucleic acids may be present in the submitted sample  additional confirmatory testing may be necessary for epidemiological  and / or clinical management purposes  to differentiate between  SARS-CoV-2 and other Sarbecovirus currently known to infect humans.  If clinically indicated additional testing with an alternate test  methodology [phone]) is advised. The SARS-CoV-2 RNA is generally  detectable in upper and lower respiratory sp ecimens during the acute  phase of infection. The expected result is Negative. Fact Sheet for Patients:  BoilerBrush.com.cy Fact Sheet for Healthcare Providers: https://pope.com/ This test is not yet approved or cleared by the [place]  FDA and has been authorized for detection and/or diagnosis of SARS-CoV-2 by FDA under an Emergency Use Authorization (EUA).  This EUA will remain in effect (meaning this test can be used) for the duration of the COVID-19 declaration  under Section 564(b)(1) of the Act, 21 U.S.C. section 360bbb-3(b)(1), unless the authorization is terminated or revoked sooner. Performed at [facility], [address] [place], [place] [address]   Protime-INR     Status: None   Collection Time: 02/27/19  6:16 AM  Result Value Ref Range   Prothrombin Time 13.3 11.4 - 15.2 seconds   INR 1.0 0.8 - 1.2    Comment: (NOTE) INR goal varies based on device and disease states. Performed at [facility], [address] [place], [place] [address]   APTT     Status: Abnormal   Collection Time: 02/27/19  6:16 AM  Result Value Ref Range   aPTT 22 (L) 24 - 36 seconds    Comment: Performed at [facility], [address] [place], [place] [address]  Triglycerides     Status: None   Collection Time: 02/27/19  6:16 AM  Result Value Ref Range   Triglycerides 57 <150 mg/dL    Comment: Performed at [facility], [address] [place], [place] [address]  Hemoglobin A1c     Status: Abnormal   Collection Time: 02/27/19  8:32 AM  Result Value Ref Range   Hgb A1c MFr Bld 6.5 (H) 4.8 - 5.6 %    Comment: (NOTE) Pre diabetes:          5.7%-6.4% Diabetes:              >6.4% Glycemic control for   <7.0% adults with diabetes    Mean Plasma Glucose 139.85 mg/dL    Comment: Performed at [facility], [address] [place], [place] [address]  Glucose, capillary     Status: Abnormal   Collection Time: 02/27/19 12:28 PM  Result Value Ref Range   Glucose-Capillary 121 (H) 70 - 99 mg/dL   Ct Abdomen Pelvis W Contrast  Result Date: 02/27/2019 CLINICAL DATA:  Acute generalized abdominal pain, localized periumbilical EXAM: CT ABDOMEN AND PELVIS WITH CONTRAST TECHNIQUE: Multidetector CT imaging of the abdomen and pelvis was performed using the standard protocol following bolus administration of intravenous contrast. CONTRAST:  100mL OMNIPAQUE IOHEXOL 300 MG/ML  SOLN COMPARISON:  Renal ultrasound November 29, 2017 FINDINGS: Lower  chest: Basilar areas of subsegmental atelectasis and/or scarring. Cardiac size is upper limits normal. Extensive atherosclerotic calcifications of the coronary arteries. Aortic leaflets are calcified as well. Mild bilateral gynecomastia. Hepatobiliary: Few subcentimeter hypoattenuating foci in segment 4 are too small to fully characterize on CT imaging but statistically likely benign (3/22). No concerning hepatic lesions. Smooth surface contour. The gallbladder contains multiple dependently layering calcified gallstones. No gallbladder wall thickening. Small amount of pericholecystic fluid, nonspecific and possibly tracking from the adjacent pancreas. No biliary ductal dilatation or visible calcified intraductal gallstones. Pancreas: Diffuse edematous thickening of the pancreatic parenchyma without focal hypoattenuation or organized collection. No pancreatic ductal dilatation. Extensive peripancreatic stranding and phlegmon with likely reactive intraperitoneal and retroperitoneal free fluid. Spleen: Normal in size without focal abnormality. Adrenals/Urinary Tract: Normal adrenal glands. Extensive renal vascular calcifications. Kidneys are otherwise unremarkable, without renal calculi, suspicious lesion, or hydronephrosis. Circumferential bladder wall thickening. Indentation of the posterior bladder wall by the enlarged prostate which demonstrates median lobe hypertrophy. Stomach/Bowel: Distal esophagus is unremarkable.  The stomach is distended with ingested material. Mild thickening of the duodenum particularly segments 3 through 4 and of the proximal jejunum just distal to the ligament of Treitz. No intramural or extraluminal gas. More distal small bowel as in the normal appearance. A normal appendix is visualized. No colonic dilatation or wall thickening. Vascular/Lymphatic: Atherosclerotic plaque within the normal caliber, tortuous aorta. No aneurysm or ectasia. Reactive adenopathy in the upper abdomen.  Reproductive: Marked prostatomegaly. Slight asymmetric hypertrophy of the right seminal vesicle, nonspecific. Other: Small volume of intraperitoneal free fluid likely extending from the pancreas to gallbladder fossa and into the right pericolic gutter to layer dependently within the pelvis. No free intraperitoneal air. No bowel containing hernias. Musculoskeletal: Multilevel degenerative changes are present in the imaged portions of the spine. Mild S-shaped curvature of the thoracolumbar spine. Additional degenerative features are present in the SI joints and hips. IMPRESSION: 1. Findings consistent with acute interstitial edematous pancreatitis. No evidence of pancreatic necrosis or organized collection. Likely reactive free fluid seen both in the intraperitoneal and retroperitoneal spaces. 2. Mild thickening of the duodenum particularly segments 3 through 4 and of the proximal jejunum just distal to the ligament of Treitz, may reflect reactive change versus less likely enteritis. 3. Cholelithiasis without CT evidence of acute cholecystitis or CT evident choledocholithiasis. If there is clinical concern for biliary obstruction, right upper quadrant ultrasound or MRCP could be obtained. 4. Marked prostatomegaly. Indentation of the posterior bladder wall by the enlarged prostate which demonstrates median lobe hypertrophy. 5. Circumferential bladder wall thickening, likely secondary to chronic outlet obstruction from the enlarged prostate. Correlate with urinalysis to exclude superimposed cystitis. 6. Aortic Atherosclerosis (ICD10-I70.0). Electronically Signed   By: Lovena Le M.D.   On: 02/27/2019 05:24   US Abdomen Limited Ruq  Result Date: 02/27/2019 CLINICAL DATA:  Acute pancreatitis.  Gallstones. EXAM: ULTRASOUND ABDOMEN LIMITED RIGHT UPPER QUADRANT COMPARISON:  CT of the abdomen pelvis 02/27/19. FINDINGS: Gallbladder: Multiple shadowing stones are present in the gallbladder. The largest and measures 1.3 cm.  Wall thickness is within normal limits at 2.0 mm. There is no sonographic Murphy sign. Common bile duct: Diameter: 4.0 mm, within normal limits Liver: An 8 mm simple cyst in the left lobe of the liver is stable. No other focal lesions are present. Normal echogenicity is present. Portal vein is patent on color Doppler imaging with normal direction of blood flow towards the liver. Other: None. IMPRESSION: 1. Cholelithiasis without cholecystitis. Electronically Signed   By: San Morelle M.D.   On: 02/27/2019 07:33      Assessment/Plan CAD s/p stent 2008 - takes plavix, last dose 8/31 HTN GERD HLD  Cholelithiasis Acute pancreatitis  - lipase > 7,000 - AST/ALT elevated and Tbili 2.6 - WBC mildly elevated, patient afebrile - clinically patient not very ttp - repeat labs in AM - will follow for surgical readiness, but will need to wait out plavix  FEN: CLD, IVF VTE: SCDs, lovenox ID: no current abx  Brigid Re, [facility] Surgery 02/27/2019, 2:09 PM Pager: [place]: [phone]

## 2019-02-27 NOTE — ED Notes (Signed)
Patient transported to Ultrasound 

## 2019-02-27 NOTE — Plan of Care (Signed)
Patient consult received. Is patient of [facility], patient saw them in office in 2018. I've reached out to [facility] for consultation. Please call us back if there are any questions.

## 2019-02-27 NOTE — ED Notes (Signed)
ED TO INPATIENT HANDOFF REPORT  ED Nurse Name and Phone #: Selena Batten 45  S Name/Age/Gender [name] 83 y.o. male Room/Bed: 038C/038C  Code Status   Code Status: Full Code  Home/SNF/Other Home Patient oriented to: self, place, time and situation Is this baseline? Yes   Triage Complete: Triage complete  Chief Complaint n/v abdominal pain  Triage Note Patient from home, complaining of abdominal pain, acute onset, with nausea and vomiting.  Patient is pale, diaphoretic and lethargic when answering questions.  No chest pain, no shortness of breath.  His pain is from umbilicus down.  Patient was given of fentanyl total.     Allergies No Known Allergies  Level of Care/Admitting Diagnosis ED Disposition    ED Disposition Condition Comment   Admit  Hospital Area: [facility] [[id number]]  Level of Care: Med-Surg [16]  Covid Evaluation: Asymptomatic Screening Protocol (No Symptoms)  Diagnosis: Pancreatitis [[id number]]  Admitting Physician: Lorretta Harp [[id number]]  Attending Physician: Lorretta Harp [phone]  Estimated length of stay: past midnight tomorrow  Certification:: I certify this patient will need inpatient services for at least 2 midnights  PT Class (Do Not Modify): Inpatient [101]  PT Acc Code (Do Not Modify): Private [1]       B Medical/Surgery History Past Medical History:  Diagnosis Date  . Acute MI, subendocardial (HCC)   . Coronary artery disease   . HCD (hypertensive cardiovascular disease)   . Hyperlipidemia   . IHD (ischemic heart disease)    Past Surgical History:  Procedure Laterality Date  . CARDIAC CATHETERIZATION  01/02/2007   EF 50%  . CORONARY STENT PLACEMENT     LEFT CIRCUMFLEX     A IV Location/Drains/Wounds Patient Lines/Drains/Airways Status   Active Line/Drains/Airways    Name:   Placement date:   Placement time:   Site:   Days:   Peripheral IV 02/27/19 Left Antecubital   02/27/19    0237    Antecubital   less than 1           Intake/Output Last 24 hours No intake or output data in the 24 hours ending 02/27/19 0944  Labs/Imaging Results for orders placed or performed during the hospital encounter of 02/27/19 (from the past 48 hour(s))  CBC with Differential     Status: Abnormal   Collection Time: 02/27/19  2:39 AM  Result Value Ref Range   WBC 11.7 (H) 4.0 - 10.5 K/uL   RBC 5.46 4.22 - 5.81 MIL/uL   Hemoglobin 14.6 13.0 - 17.0 g/dL   HCT 65.7 84.6 - 96.2 %   MCV 86.1 80.0 - 100.0 fL   MCH 26.7 26.0 - 34.0 pg   MCHC 31.1 30.0 - 36.0 g/dL   RDW 95.2 84.1 - 32.4 %   Platelets 179 150 - 400 K/uL   nRBC 0.0 0.0 - 0.2 %   Neutrophils Relative % 87 %   Neutro Abs 10.0 (H) 1.7 - 7.7 K/uL   Lymphocytes Relative 3 %   Lymphs Abs 0.4 (L) 0.7 - 4.0 K/uL   Monocytes Relative 10 %   Monocytes Absolute 1.2 (H) 0.1 - 1.0 K/uL   Eosinophils Relative 0 %   Eosinophils Absolute 0.0 0.0 - 0.5 K/uL   Basophils Relative 0 %   Basophils Absolute 0.0 0.0 - 0.1 K/uL   Immature Granulocytes 0 %   Abs Immature Granulocytes 0.05 0.00 - 0.07 K/uL    Comment: Performed at [facility], [address], [place], [place] [address]  Comprehensive metabolic panel     Status: Abnormal   Collection Time: 02/27/19  2:39 AM  Result Value Ref Range   Sodium 140 135 - 145 mmol/L   Potassium 3.8 3.5 - 5.1 mmol/L   Chloride 104 98 - 111 mmol/L   CO2 21 (L) 22 - 32 mmol/L   Glucose, Bld 173 (H) 70 - 99 mg/dL   BUN 10 8 - 23 mg/dL   Creatinine, Ser 4.70 0.61 - 1.24 mg/dL   Calcium 9.4 8.9 - 96.2 mg/dL   Total Protein 7.1 6.5 - 8.1 g/dL   Albumin 3.8 3.5 - 5.0 g/dL   AST 8,366 (H) 15 - 41 U/L   ALT 843 (H) 0 - 44 U/L   Alkaline Phosphatase 115 38 - 126 U/L   Total Bilirubin 2.6 (H) 0.3 - 1.2 mg/dL   GFR calc non Af Amer 58 (L) >60 mL/min   GFR calc Af Amer >60 >60 mL/min   Anion gap 15 5 - 15    Comment: Performed at [facility], [address], [place], [place] [address]  Lipase, blood     Status: Abnormal    Collection Time: 02/27/19  2:39 AM  Result Value Ref Range   Lipase 7,221 (H) 11 - 51 U/L    Comment: RESULTS CONFIRMED BY MANUAL DILUTION Performed at [facility], [address], [place], [place] [address]   Urinalysis, Routine w reflex microscopic     Status: Abnormal   Collection Time: 02/27/19  2:39 AM  Result Value Ref Range   Color, Urine YELLOW YELLOW   APPearance CLEAR CLEAR   Specific Gravity, Urine 1.032 (H) 1.005 - 1.030   pH 5.0 5.0 - 8.0   Glucose, UA NEGATIVE NEGATIVE mg/dL   Hgb urine dipstick SMALL (A) NEGATIVE   Bilirubin Urine NEGATIVE NEGATIVE   Ketones, ur NEGATIVE NEGATIVE mg/dL   Protein, ur 30 (A) NEGATIVE mg/dL   Nitrite NEGATIVE NEGATIVE   Leukocytes,Ua NEGATIVE NEGATIVE   RBC / HPF 0-5 0 - 5 RBC/hpf   WBC, UA 0-5 0 - 5 WBC/hpf   Bacteria, UA NONE SEEN NONE SEEN   Squamous Epithelial / LPF 0-5 0 - 5    Comment: Performed at [facility], [address], [place], [place] [address]  SARS Coronavirus 2 [facility] order, Performed in [facility] hospital lab) Nasopharyngeal Nasopharyngeal Swab     Status: None   Collection Time: 02/27/19  5:49 AM   Specimen: Nasopharyngeal Swab  Result Value Ref Range   SARS Coronavirus 2 NEGATIVE NEGATIVE    Comment: (NOTE) If result is NEGATIVE SARS-CoV-2 target nucleic acids are NOT DETECTED. The SARS-CoV-2 RNA is generally detectable in upper and lower  respiratory specimens during the acute phase of infection. The lowest  concentration of SARS-CoV-2 viral copies this assay can detect is 250  copies / mL. A negative result does not preclude SARS-CoV-2 infection  and should not be used as the sole basis for treatment or other  patient management decisions.  A negative result may occur with  improper specimen collection / handling, submission of specimen other  than nasopharyngeal swab, presence of viral mutation(s) within the  areas targeted by this assay, and inadequate number of viral copies  (<250  copies / mL). A negative result must be combined with clinical  observations, patient history, and epidemiological information. If result is POSITIVE SARS-CoV-2 target nucleic acids are DETECTED. The SARS-CoV-2  RNA is generally detectable in upper and lower  respiratory specimens dur ing the acute phase of infection.  Positive  results are indicative of active infection with SARS-CoV-2.  Clinical  correlation with patient history and other diagnostic information is  necessary to determine patient infection status.  Positive results do  not rule out bacterial infection or co-infection with other viruses. If result is PRESUMPTIVE POSTIVE SARS-CoV-2 nucleic acids MAY BE PRESENT.   A presumptive positive result was obtained on the submitted specimen  and confirmed on repeat testing.  While 2019 novel coronavirus  (SARS-CoV-2) nucleic acids may be present in the submitted sample  additional confirmatory testing may be necessary for epidemiological  and / or clinical management purposes  to differentiate between  SARS-CoV-2 and other Sarbecovirus currently known to infect humans.  If clinically indicated additional testing with an alternate test  methodology [phone] is advised. The SARS-CoV-2 RNA is generally  detectable in upper and lower respiratory sp ecimens during the acute  phase of infection. The expected result is Negative. Fact Sheet for Patients:  BoilerBrush.com.cyhttps://www.fda.gov/media/136312/download Fact Sheet for Healthcare Providers: https://pope.com/https://www.fda.gov/media/136313/download This test is not yet approved or cleared by the [place] FDA and has been authorized for detection and/or diagnosis of SARS-CoV-2 by FDA under an Emergency Use Authorization (EUA).  This EUA will remain in effect (meaning this test can be used) for the duration of the COVID-19 declaration under Section 564(b)(1) of the Act, 21 U.S.C. section 360bbb-3(b)(1), unless the authorization is terminated or revoked  sooner. Performed at [facility], [address] [place], [place] [address]   Protime-INR     Status: None   Collection Time: 02/27/19  6:16 AM  Result Value Ref Range   Prothrombin Time 13.3 11.4 - 15.2 seconds   INR 1.0 0.8 - 1.2    Comment: (NOTE) INR goal varies based on device and disease states. Performed at [facility], [address] [place], [place] [address]   APTT     Status: Abnormal   Collection Time: 02/27/19  6:16 AM  Result Value Ref Range   aPTT 22 (L) 24 - 36 seconds    Comment: Performed at [facility], [address] [place], [place] [address]  Triglycerides     Status: None   Collection Time: 02/27/19  6:16 AM  Result Value Ref Range   Triglycerides 57 <150 mg/dL    Comment: Performed at [facility], [address] [place], [place] [address]  Hemoglobin A1c     Status: Abnormal   Collection Time: 02/27/19  8:32 AM  Result Value Ref Range   Hgb A1c MFr Bld 6.5 (H) 4.8 - 5.6 %    Comment: (NOTE) Pre diabetes:          5.7%-6.4% Diabetes:              >6.4% Glycemic control for   <7.0% adults with diabetes    Mean Plasma Glucose 139.85 mg/dL    Comment: Performed at [facility], [address] [place], [place] [address]   Ct Abdomen Pelvis W Contrast  Result Date: 02/27/2019 CLINICAL DATA:  Acute generalized abdominal pain, localized periumbilical EXAM: CT ABDOMEN AND PELVIS WITH CONTRAST TECHNIQUE: Multidetector CT imaging of the abdomen and pelvis was performed using the standard protocol following bolus administration of intravenous contrast. CONTRAST:  100mL OMNIPAQUE IOHEXOL 300 MG/ML  SOLN COMPARISON:  Renal ultrasound November 29, 2017 FINDINGS: Lower chest: Basilar areas  of subsegmental atelectasis and/or scarring. Cardiac size is upper limits normal. Extensive atherosclerotic calcifications of the coronary arteries. Aortic leaflets are calcified as well. Mild bilateral gynecomastia. Hepatobiliary: Few subcentimeter  hypoattenuating foci in segment 4 are too small to fully characterize on CT imaging but statistically likely benign (3/22). No concerning hepatic lesions. Smooth surface contour. The gallbladder contains multiple dependently layering calcified gallstones. No gallbladder wall thickening. Small amount of pericholecystic fluid, nonspecific and possibly tracking from the adjacent pancreas. No biliary ductal dilatation or visible calcified intraductal gallstones. Pancreas: Diffuse edematous thickening of the pancreatic parenchyma without focal hypoattenuation or organized collection. No pancreatic ductal dilatation. Extensive peripancreatic stranding and phlegmon with likely reactive intraperitoneal and retroperitoneal free fluid. Spleen: Normal in size without focal abnormality. Adrenals/Urinary Tract: Normal adrenal glands. Extensive renal vascular calcifications. Kidneys are otherwise unremarkable, without renal calculi, suspicious lesion, or hydronephrosis. Circumferential bladder wall thickening. Indentation of the posterior bladder wall by the enlarged prostate which demonstrates median lobe hypertrophy. Stomach/Bowel: Distal esophagus is unremarkable. The stomach is distended with ingested material. Mild thickening of the duodenum particularly segments 3 through 4 and of the proximal jejunum just distal to the ligament of Treitz. No intramural or extraluminal gas. More distal small bowel as in the normal appearance. A normal appendix is visualized. No colonic dilatation or wall thickening. Vascular/Lymphatic: Atherosclerotic plaque within the normal caliber, tortuous aorta. No aneurysm or ectasia. Reactive adenopathy in the upper abdomen. Reproductive: Marked prostatomegaly. Slight asymmetric hypertrophy of the right seminal vesicle, nonspecific. Other: Small volume of intraperitoneal free fluid likely extending from the pancreas to gallbladder fossa and into the right pericolic gutter to layer dependently within  the pelvis. No free intraperitoneal air. No bowel containing hernias. Musculoskeletal: Multilevel degenerative changes are present in the imaged portions of the spine. Mild S-shaped curvature of the thoracolumbar spine. Additional degenerative features are present in the SI joints and hips. IMPRESSION: 1. Findings consistent with acute interstitial edematous pancreatitis. No evidence of pancreatic necrosis or organized collection. Likely reactive free fluid seen both in the intraperitoneal and retroperitoneal spaces. 2. Mild thickening of the duodenum particularly segments 3 through 4 and of the proximal jejunum just distal to the ligament of Treitz, may reflect reactive change versus less likely enteritis. 3. Cholelithiasis without CT evidence of acute cholecystitis or CT evident choledocholithiasis. If there is clinical concern for biliary obstruction, right upper quadrant ultrasound or MRCP could be obtained. 4. Marked prostatomegaly. Indentation of the posterior bladder wall by the enlarged prostate which demonstrates median lobe hypertrophy. 5. Circumferential bladder wall thickening, likely secondary to chronic outlet obstruction from the enlarged prostate. Correlate with urinalysis to exclude superimposed cystitis. 6. Aortic Atherosclerosis (ICD10-I70.0). Electronically Signed   By: Lovena Le M.D.   On: 02/27/2019 05:24   US Abdomen Limited Ruq  Result Date: 02/27/2019 CLINICAL DATA:  Acute pancreatitis.  Gallstones. EXAM: ULTRASOUND ABDOMEN LIMITED RIGHT UPPER QUADRANT COMPARISON:  CT of the abdomen pelvis 02/27/19. FINDINGS: Gallbladder: Multiple shadowing stones are present in the gallbladder. The largest and measures 1.3 cm. Wall thickness is within normal limits at 2.0 mm. There is no sonographic Murphy sign. Common bile duct: Diameter: 4.0 mm, within normal limits Liver: An 8 mm simple cyst in the left lobe of the liver is stable. No other focal lesions are present. Normal echogenicity is present.  Portal vein is patent on color Doppler imaging with normal direction of blood flow towards the liver. Other: None. IMPRESSION: 1. Cholelithiasis without cholecystitis. Electronically Signed   By: Harrell Gave  Mattern M.D.   On: 02/27/2019 07:33    Pending Labs Unresulted Labs (From admission, onward)    Start     Ordered   02/28/19 0500  Comprehensive metabolic panel  Tomorrow morning,   R     02/27/19 0733   02/28/19 0500  CBC  Tomorrow morning,   R     02/27/19 0733          Vitals/Pain Today's Vitals   02/27/19 0845 02/27/19 0900 02/27/19 0915 02/27/19 0930  BP: (!) 172/94 (!) 187/100 (!) 191/105 (!) 184/100  Pulse: 86 90 98 85  Resp:      Temp:      TempSrc:      SpO2: 96% 100% 99% 98%  PainSc:        Isolation Precautions No active isolations  Medications Medications  ondansetron (ZOFRAN) injection 4 mg (has no administration in time range)  morphine 2 MG/ML injection 1 mg (has no administration in time range)  hydrALAZINE (APRESOLINE) injection 5 mg (has no administration in time range)  0.9 %  sodium chloride infusion ( Intravenous New Bag/Given 02/27/19 0743)  amLODipine (NORVASC) tablet 5 mg (has no administration in time range)  metoprolol succinate (TOPROL-XL) 24 hr tablet 50 mg (has no administration in time range)  clopidogrel (PLAVIX) tablet 75 mg (has no administration in time range)  enoxaparin (LOVENOX) injection 40 mg (has no administration in time range)  albuterol (PROVENTIL) (2.5 MG/3ML) 0.083% nebulizer solution 2.5 mg (has no administration in time range)  insulin aspart (novoLOG) injection 0-9 Units (has no administration in time range)  iohexol (OMNIPAQUE) 300 MG/ML solution 100 mL (100 mLs Intravenous Contrast Given 02/27/19 0452)  fentaNYL (SUBLIMAZE) injection 50 mcg (50 mcg Intravenous Given 02/27/19 0546)    Mobility walks Low fall risk   R Recommendations: See Admitting Provider Note  Report given to:   Additional Notes:

## 2019-02-28 LAB — CBC
HCT: 43.7 % (ref 39.0–52.0)
Hemoglobin: 13.9 g/dL (ref 13.0–17.0)
MCH: 26.7 pg (ref 26.0–34.0)
MCHC: 31.8 g/dL (ref 30.0–36.0)
MCV: 84 fL (ref 80.0–100.0)
Platelets: 184 10*3/uL (ref 150–400)
RBC: 5.2 MIL/uL (ref 4.22–5.81)
RDW: 14.3 % (ref 11.5–15.5)
WBC: 11.2 10*3/uL — ABNORMAL HIGH (ref 4.0–10.5)
nRBC: 0 % (ref 0.0–0.2)

## 2019-02-28 LAB — COMPREHENSIVE METABOLIC PANEL
ALT: 537 U/L — ABNORMAL HIGH (ref 0–44)
AST: 281 U/L — ABNORMAL HIGH (ref 15–41)
Albumin: 3.3 g/dL — ABNORMAL LOW (ref 3.5–5.0)
Alkaline Phosphatase: 89 U/L (ref 38–126)
Anion gap: 9 (ref 5–15)
BUN: 10 mg/dL (ref 8–23)
CO2: 24 mmol/L (ref 22–32)
Calcium: 8.8 mg/dL — ABNORMAL LOW (ref 8.9–10.3)
Chloride: 105 mmol/L (ref 98–111)
Creatinine, Ser: 1.05 mg/dL (ref 0.61–1.24)
GFR calc Af Amer: >60 mL/min (ref >60)
GFR calc non Af Amer: >60 mL/min (ref >60)
Glucose, Bld: 149 mg/dL — ABNORMAL HIGH (ref 70–99)
Potassium: 3.4 mmol/L — ABNORMAL LOW (ref 3.5–5.1)
Sodium: 138 mmol/L (ref 135–145)
Total Bilirubin: 1 mg/dL (ref 0.3–1.2)
Total Protein: 6.6 g/dL (ref 6.5–8.1)

## 2019-02-28 LAB — GLUCOSE, CAPILLARY
Glucose-Capillary: 135 mg/dL — ABNORMAL HIGH (ref 70–99)
Glucose-Capillary: 156 mg/dL — ABNORMAL HIGH (ref 70–99)
Glucose-Capillary: 138 mg/dL — ABNORMAL HIGH (ref 70–99)
Glucose-Capillary: 138 mg/dL — ABNORMAL HIGH (ref 70–99)

## 2019-02-28 LAB — LIPASE, BLOOD: Lipase: 1613 U/L — ABNORMAL HIGH (ref 11–51)

## 2019-02-28 MED ORDER — SODIUM CHLORIDE 0.9% FLUSH: 10.0000 mL | INTRAVENOUS | Status: DC | PRN

## 2019-02-28 NOTE — Progress Notes (Signed)
PROGRESS NOTE    [name]  VYX:[id number] DOB: 1935/08/11 DOA: 02/27/2019 PCP: Rometta Emery, MD   Brief Narrative:  83 year old with history of essential hypertension, coronary artery disease status post stent, hyperlipidemia presented with abdominal pain.  He was found to have acute pancreatitis.  Lipase was greater than 7000, CT abdomen pelvis showed acute interstitial edematous pancreatitis with cholelithiasis.   Assessment & Plan:   Principal Problem:   Pancreatitis Active Problems:   HLD (hyperlipidemia)   Type 2 diabetes mellitus without complication (HCC)   Cholelithiasis   Leukocytosis   Nausea and vomiting   CAD (coronary artery disease)   Acute gallstone pancreatitis -Clear liquid diet.  Aggressive IV fluids -Pain control -GI and general surgery following -Plans for laparoscopic cholecystectomy once LFTs are normalized and have improved clinically.  Transaminitis/elevated total bilirubin, improving - Secondary to gallstone pancreatitis.  Continue to trend LFTs -Right upper quadrant ultrasound-gallstones seen  Essential hypertension -Norvasc 5 mg daily -Metoprolol 50 mg daily  Coronary artery disease -Plavix on hold  Diabetes mellitus type 2 - Insulin sliding scale and Accu-Chek.  Globin A1c 6.5 -Metformin on hold.  Hyperlipidemia - Statins on hold due to abnormal LFTs.  Resume when appropriate   DVT prophylaxis: Lovenox Code Status:   Full code Family Communication:   None at bedside, I offered to call his son but he states he will relay the information Disposition Plan:  Maintain hospital stay until surgical intervention.  Surgical intervention once his pancreatitis has improved  Consultants:    GI   General surgery  Procedures:    none  Antimicrobials:    none   Subjective:  states his abdominal pain is slightly improved.  No other acute events overnight.  Review of Systems Otherwise negative except as per HPI, including:  General: Denies fever, chills, night sweats or unintended weight loss. Resp: Denies cough, wheezing, shortness of breath. Cardiac: Denies chest pain, palpitations, orthopnea, paroxysmal nocturnal dyspnea. GI: Denies abdominal pain, nausea, vomiting, diarrhea or constipation GU: Denies dysuria, frequency, hesitancy or incontinence MS: Denies muscle aches, joint pain or swelling Neuro: Denies headache, neurologic deficits (focal weakness, numbness, tingling), abnormal gait Psych: Denies anxiety, depression, SI/HI/AVH Skin: Denies new rashes or lesions ID: Denies sick contacts, exotic exposures, travel  Objective: Vitals:   02/27/19 1107 02/27/19 1623 02/27/19 2123 02/28/19 0539  BP: (!) 163/87 (!) 164/81 (!) 158/79 (!) 150/87  Pulse: 80 73 78 84  Resp: 20 18 18 19   Temp: 98.6 F (37 C) 98.8 F (37.1 C) 99.3 F (37.4 C) 99.8 F (37.7 C)  TempSrc: Oral Oral Oral Oral  SpO2: 98% 99% 96% 100%    Intake/Output Summary (Last 24 hours) at 02/28/2019 0744 Last data filed at 02/28/2019 0700 Gross per 24 hour  Intake 3150.38 ml  Output 451 ml  Net 2699.38 ml   There were no vitals filed for this visit.  Examination:  General exam: Appears calm and comfortable  Respiratory system: Clear to auscultation. Respiratory effort normal. Cardiovascular system: S1 & S2 heard, RRR. No JVD, murmurs, rubs, gallops or clicks. No pedal edema. Gastrointestinal system: Abdomen is nondistended, soft and nontender. No organomegaly or masses felt. Normal bowel sounds heard. Central nervous system: Alert and oriented. No focal neurological deficits. Extremities: Symmetric 5 x 5 power. Skin: No rashes, lesions or ulcers Psychiatry: Judgement and insight appear normal. Mood & affect appropriate.     Data Reviewed:   CBC: Recent Labs  Lab 02/27/19 0239 02/28/19 0220  WBC  11.7* 11.2*  NEUTROABS 10.0*  --   HGB 14.6 13.9  HCT 47.0 43.7  MCV 86.1 84.0  PLT 179 536   Basic Metabolic Panel: Recent  Labs  Lab 02/27/19 0239 02/28/19 0220  NA 140 138  K 3.8 3.4*  CL 104 105  CO2 21* 24  GLUCOSE 173* 149*  BUN 10 10  CREATININE 1.18 1.05  CALCIUM 9.4 8.8*   GFR: CrCl cannot be calculated (Unknown ideal weight.). Liver Function Tests: Recent Labs  Lab 02/27/19 0239 02/28/19 0220  AST 1,195* 281*  ALT 843* 537*  ALKPHOS 115 89  BILITOT 2.6* 1.0  PROT 7.1 6.6  ALBUMIN 3.8 3.3*   Recent Labs  Lab 02/27/19 0239 02/28/19 0220  LIPASE 7,221* 1,613*   No results for input(s): AMMONIA in the last 168 hours. Coagulation Profile: Recent Labs  Lab 02/27/19 0616  INR 1.0   Cardiac Enzymes: No results for input(s): CKTOTAL, CKMB, CKMBINDEX, TROPONINI in the last 168 hours. BNP (last 3 results) No results for input(s): PROBNP in the last 8760 hours. HbA1C: Recent Labs    02/27/19 0832  HGBA1C 6.5*   CBG: Recent Labs  Lab 02/27/19 1228 02/27/19 1624 02/27/19 2121  GLUCAP 121* 111* 144*   Lipid Profile: Recent Labs    02/27/19 0616  TRIG 57   Thyroid Function Tests: No results for input(s): TSH, T4TOTAL, FREET4, T3FREE, THYROIDAB in the last 72 hours. Anemia Panel: No results for input(s): VITAMINB12, FOLATE, FERRITIN, TIBC, IRON, RETICCTPCT in the last 72 hours. Sepsis Labs: No results for input(s): PROCALCITON, LATICACIDVEN in the last 168 hours.  Recent Results (from the past 240 hour(s))  SARS Coronavirus 2 [facility] order, Performed in [facility] hospital lab) Nasopharyngeal Nasopharyngeal Swab     Status: None   Collection Time: 02/27/19  5:49 AM   Specimen: Nasopharyngeal Swab  Result Value Ref Range Status   SARS Coronavirus 2 NEGATIVE NEGATIVE Final    Comment: (NOTE) If result is NEGATIVE SARS-CoV-2 target nucleic acids are NOT DETECTED. The SARS-CoV-2 RNA is generally detectable in upper and lower  respiratory specimens during the acute phase of infection. The lowest  concentration of SARS-CoV-2 viral copies this assay can detect is 250   copies / mL. A negative result does not preclude SARS-CoV-2 infection  and should not be used as the sole basis for treatment or other  patient management decisions.  A negative result may occur with  improper specimen collection / handling, submission of specimen other  than nasopharyngeal swab, presence of viral mutation(s) within the  areas targeted by this assay, and inadequate number of viral copies  (<250 copies / mL). A negative result must be combined with clinical  observations, patient history, and epidemiological information. If result is POSITIVE SARS-CoV-2 target nucleic acids are DETECTED. The SARS-CoV-2 RNA is generally detectable in upper and lower  respiratory specimens dur ing the acute phase of infection.  Positive  results are indicative of active infection with SARS-CoV-2.  Clinical  correlation with patient history and other diagnostic information is  necessary to determine patient infection status.  Positive results do  not rule out bacterial infection or co-infection with other viruses. If result is PRESUMPTIVE POSTIVE SARS-CoV-2 nucleic acids MAY BE PRESENT.   A presumptive positive result was obtained on the submitted specimen  and confirmed on repeat testing.  While 2019 novel coronavirus  (SARS-CoV-2) nucleic acids may be present in the submitted sample  additional confirmatory testing may be necessary for epidemiological  and / or clinical management purposes  to differentiate between  SARS-CoV-2 and other Sarbecovirus currently known to infect humans.  If clinically indicated additional testing with an alternate test  methodology [phone]([id number]) is advised. The SARS-CoV-2 RNA is generally  detectable in upper and lower respiratory sp ecimens during the acute  phase of infection. The expected result is Negative. Fact Sheet for Patients:  BoilerBrush.com.cyhttps://www.fda.gov/media/136312/download Fact Sheet for Healthcare Providers: https://pope.com/https://www.fda.gov/media/136313/download  This test is not yet approved or cleared by the [place] FDA and has been authorized for detection and/or diagnosis of SARS-CoV-2 by FDA under an Emergency Use Authorization (EUA).  This EUA will remain in effect (meaning this test can be used) for the duration of the COVID-19 declaration under Section 564(b)(1) of the Act, 21 U.S.C. section 360bbb-3(b)(1), unless the authorization is terminated or revoked sooner. Performed at [facility], [address] [place], [place] [address]          Radiology Studies: Ct Abdomen Pelvis W Contrast  Result Date: 02/27/2019 CLINICAL DATA:  Acute generalized abdominal pain, localized periumbilical EXAM: CT ABDOMEN AND PELVIS WITH CONTRAST TECHNIQUE: Multidetector CT imaging of the abdomen and pelvis was performed using the standard protocol following bolus administration of intravenous contrast. CONTRAST:  100mL OMNIPAQUE IOHEXOL 300 MG/ML  SOLN COMPARISON:  Renal ultrasound November 29, 2017 FINDINGS: Lower chest: Basilar areas of subsegmental atelectasis and/or scarring. Cardiac size is upper limits normal. Extensive atherosclerotic calcifications of the coronary arteries. Aortic leaflets are calcified as well. Mild bilateral gynecomastia. Hepatobiliary: Few subcentimeter hypoattenuating foci in segment 4 are too small to fully characterize on CT imaging but statistically likely benign (3/22). No concerning hepatic lesions. Smooth surface contour. The gallbladder contains multiple dependently layering calcified gallstones. No gallbladder wall thickening. Small amount of pericholecystic fluid, nonspecific and possibly tracking from the adjacent pancreas. No biliary ductal dilatation or visible calcified intraductal gallstones. Pancreas: Diffuse edematous thickening of the pancreatic parenchyma without focal hypoattenuation or organized collection. No pancreatic ductal dilatation. Extensive peripancreatic stranding and phlegmon with likely reactive  intraperitoneal and retroperitoneal free fluid. Spleen: Normal in size without focal abnormality. Adrenals/Urinary Tract: Normal adrenal glands. Extensive renal vascular calcifications. Kidneys are otherwise unremarkable, without renal calculi, suspicious lesion, or hydronephrosis. Circumferential bladder wall thickening. Indentation of the posterior bladder wall by the enlarged prostate which demonstrates median lobe hypertrophy. Stomach/Bowel: Distal esophagus is unremarkable. The stomach is distended with ingested material. Mild thickening of the duodenum particularly segments 3 through 4 and of the proximal jejunum just distal to the ligament of Treitz. No intramural or extraluminal gas. More distal small bowel as in the normal appearance. A normal appendix is visualized. No colonic dilatation or wall thickening. Vascular/Lymphatic: Atherosclerotic plaque within the normal caliber, tortuous aorta. No aneurysm or ectasia. Reactive adenopathy in the upper abdomen. Reproductive: Marked prostatomegaly. Slight asymmetric hypertrophy of the right seminal vesicle, nonspecific. Other: Small volume of intraperitoneal free fluid likely extending from the pancreas to gallbladder fossa and into the right pericolic gutter to layer dependently within the pelvis. No free intraperitoneal air. No bowel containing hernias. Musculoskeletal: Multilevel degenerative changes are present in the imaged portions of the spine. Mild S-shaped curvature of the thoracolumbar spine. Additional degenerative features are present in the SI joints and hips. IMPRESSION: 1. Findings consistent with acute interstitial edematous pancreatitis. No evidence of pancreatic necrosis or organized collection. Likely reactive free fluid seen both in the intraperitoneal and retroperitoneal spaces. 2. Mild thickening of the duodenum particularly segments 3 through 4 and of  the proximal jejunum just distal to the ligament of Treitz, may reflect reactive change  versus less likely enteritis. 3. Cholelithiasis without CT evidence of acute cholecystitis or CT evident choledocholithiasis. If there is clinical concern for biliary obstruction, right upper quadrant ultrasound or MRCP could be obtained. 4. Marked prostatomegaly. Indentation of the posterior bladder wall by the enlarged prostate which demonstrates median lobe hypertrophy. 5. Circumferential bladder wall thickening, likely secondary to chronic outlet obstruction from the enlarged prostate. Correlate with urinalysis to exclude superimposed cystitis. 6. Aortic Atherosclerosis (ICD10-I70.0). Electronically Signed   By: Kreg ShropshirePrice  DeHay M.D.   On: 02/27/2019 05:24   [place] Abdomen Limited Ruq  Result Date: 02/27/2019 CLINICAL DATA:  Acute pancreatitis.  Gallstones. EXAM: ULTRASOUND ABDOMEN LIMITED RIGHT UPPER QUADRANT COMPARISON:  CT of the abdomen pelvis 02/27/19. FINDINGS: Gallbladder: Multiple shadowing stones are present in the gallbladder. The largest and measures 1.3 cm. Wall thickness is within normal limits at 2.0 mm. There is no sonographic Murphy sign. Common bile duct: Diameter: 4.0 mm, within normal limits Liver: An 8 mm simple cyst in the left lobe of the liver is stable. No other focal lesions are present. Normal echogenicity is present. Portal vein is patent on color Doppler imaging with normal direction of blood flow towards the liver. Other: None. IMPRESSION: 1. Cholelithiasis without cholecystitis. Electronically Signed   By: Marin Robertshristopher  Mattern M.D.   On: 02/27/2019 07:33        Scheduled Meds: . amLODipine  5 mg Oral Daily  . enoxaparin (LOVENOX) injection  40 mg Subcutaneous Daily  . insulin aspart  0-9 Units Subcutaneous TID WC  . metoprolol succinate  50 mg Oral Daily   Continuous Infusions: . sodium chloride 125 mL/hr at 02/28/19 0112     LOS: 1 day   Time spent= 35 mins    Leza Apsey Joline Maxcyhirag Ginnette Gates, MD [facility]  If 7PM-7AM, please contact night-coverage [email or web address]  02/28/2019, 7:44 AM

## 2019-02-28 NOTE — Progress Notes (Signed)
Daily Rounding Note  02/28/2019, 10:27 AM  LOS: 1 day   SUBJECTIVE:   Chief complaint: Acute pancreatitis, presumed biliary. Abdominal pain improved.  No nausea or vomiting.  Tolerating clear liquids.  Appetite diminished.  OBJECTIVE:         Vital signs in last 24 hours:    Temp:  [98.6 F (37 C)-99.8 F (37.7 C)] 99.8 F (37.7 C) (09/02 0539) Pulse Rate:  [73-84] 84 (09/02 0539) Resp:  [18-20] 19 (09/02 0539) BP: (150-164)/(79-87) 150/87 (09/02 0539) SpO2:  [96 %-100 %] 100 % (09/02 0539) Last BM Date: 02/26/19 There were no vitals filed for this visit. General: Looks well.  Not ill-appearing. Heart: RRR Chest: clear bil.   Abdomen: soft, ND, active BS.  Minor discomfort at the epigastrium. Extremities: No CCE. Neuro/Psych: Oriented x3.  Fully alert, no deficits, no tremors.  Intake/Output from previous day: 09/01 0701 - 09/02 0700 In: 3150.4 [P.O.:240; I.V.:2910.4] Out: 451 [Urine:451]  Intake/Output this shift: No intake/output data recorded.  Lab Results: Recent Labs    02/27/19 0239 02/28/19 0220  WBC 11.7* 11.2*  HGB 14.6 13.9  HCT 47.0 43.7  PLT 179 184   BMET Recent Labs    02/27/19 0239 02/28/19 0220  NA 140 138  K 3.8 3.4*  CL 104 105  CO2 21* 24  GLUCOSE 173* 149*  BUN 10 10  CREATININE 1.18 1.05  CALCIUM 9.4 8.8*   LFT Recent Labs    02/27/19 0239 02/28/19 0220  PROT 7.1 6.6  ALBUMIN 3.8 3.3*  AST 1,195* 281*  ALT 843* 537*  ALKPHOS 115 89  BILITOT 2.6* 1.0   PT/INR Recent Labs    02/27/19 0616  LABPROT 13.3  INR 1.0   Hepatitis Panel No results for input(s): HEPBSAG, HCVAB, HEPAIGM, HEPBIGM in the last 72 hours.  Studies/Results: Ct Abdomen Pelvis W Contrast  Result Date: 02/27/2019 CLINICAL DATA:  Acute generalized abdominal pain, localized periumbilical EXAM: CT ABDOMEN AND PELVIS WITH CONTRAST TECHNIQUE: Multidetector CT imaging of the abdomen and pelvis  was performed using the standard protocol following bolus administration of intravenous contrast. CONTRAST:  183mL OMNIPAQUE IOHEXOL 300 MG/ML  SOLN COMPARISON:  Renal ultrasound November 29, 2017 FINDINGS: Lower chest: Basilar areas of subsegmental atelectasis and/or scarring. Cardiac size is upper limits normal. Extensive atherosclerotic calcifications of the coronary arteries. Aortic leaflets are calcified as well. Mild bilateral gynecomastia. Hepatobiliary: Few subcentimeter hypoattenuating foci in segment 4 are too small to fully characterize on CT imaging but statistically likely benign (3/22). No concerning hepatic lesions. Smooth surface contour. The gallbladder contains multiple dependently layering calcified gallstones. No gallbladder wall thickening. Small amount of pericholecystic fluid, nonspecific and possibly tracking from the adjacent pancreas. No biliary ductal dilatation or visible calcified intraductal gallstones. Pancreas: Diffuse edematous thickening of the pancreatic parenchyma without focal hypoattenuation or organized collection. No pancreatic ductal dilatation. Extensive peripancreatic stranding and phlegmon with likely reactive intraperitoneal and retroperitoneal free fluid. Spleen: Normal in size without focal abnormality. Adrenals/Urinary Tract: Normal adrenal glands. Extensive renal vascular calcifications. Kidneys are otherwise unremarkable, without renal calculi, suspicious lesion, or hydronephrosis. Circumferential bladder wall thickening. Indentation of the posterior bladder wall by the enlarged prostate which demonstrates median lobe hypertrophy. Stomach/Bowel: Distal esophagus is unremarkable. The stomach is distended with ingested material. Mild thickening of the duodenum particularly segments 3 through 4 and of the proximal jejunum just distal to the ligament of Treitz. No intramural or extraluminal gas. More distal small  bowel as in the normal appearance. A normal appendix is  visualized. No colonic dilatation or wall thickening. Vascular/Lymphatic: Atherosclerotic plaque within the normal caliber, tortuous aorta. No aneurysm or ectasia. Reactive adenopathy in the upper abdomen. Reproductive: Marked prostatomegaly. Slight asymmetric hypertrophy of the right seminal vesicle, nonspecific. Other: Small volume of intraperitoneal free fluid likely extending from the pancreas to gallbladder fossa and into the right pericolic gutter to layer dependently within the pelvis. No free intraperitoneal air. No bowel containing hernias. Musculoskeletal: Multilevel degenerative changes are present in the imaged portions of the spine. Mild S-shaped curvature of the thoracolumbar spine. Additional degenerative features are present in the SI joints and hips. IMPRESSION: 1. Findings consistent with acute interstitial edematous pancreatitis. No evidence of pancreatic necrosis or organized collection. Likely reactive free fluid seen both in the intraperitoneal and retroperitoneal spaces. 2. Mild thickening of the duodenum particularly segments 3 through 4 and of the proximal jejunum just distal to the ligament of Treitz, may reflect reactive change versus less likely enteritis. 3. Cholelithiasis without CT evidence of acute cholecystitis or CT evident choledocholithiasis. If there is clinical concern for biliary obstruction, right upper quadrant ultrasound or MRCP could be obtained. 4. Marked prostatomegaly. Indentation of the posterior bladder wall by the enlarged prostate which demonstrates median lobe hypertrophy. 5. Circumferential bladder wall thickening, likely secondary to chronic outlet obstruction from the enlarged prostate. Correlate with urinalysis to exclude superimposed cystitis. 6. Aortic Atherosclerosis (ICD10-I70.0). Electronically Signed   By: Kreg ShropshirePrice  DeHay M.D.   On: 02/27/2019 05:24   [place] Abdomen Limited Ruq  Result Date: 02/27/2019 CLINICAL DATA:  Acute pancreatitis.  Gallstones. EXAM:  ULTRASOUND ABDOMEN LIMITED RIGHT UPPER QUADRANT COMPARISON:  CT of the abdomen pelvis 02/27/19. FINDINGS: Gallbladder: Multiple shadowing stones are present in the gallbladder. The largest and measures 1.3 cm. Wall thickness is within normal limits at 2.0 mm. There is no sonographic Murphy sign. Common bile duct: Diameter: 4.0 mm, within normal limits Liver: An 8 mm simple cyst in the left lobe of the liver is stable. No other focal lesions are present. Normal echogenicity is present. Portal vein is patent on color Doppler imaging with normal direction of blood flow towards the liver. Other: None. IMPRESSION: 1. Cholelithiasis without cholecystitis. Electronically Signed   By: Marin Robertshristopher  Mattern M.D.   On: 02/27/2019 07:33   Scheduled Meds:  amLODipine  5 mg Oral Daily   enoxaparin (LOVENOX) injection  40 mg Subcutaneous Daily   insulin aspart  0-9 Units Subcutaneous TID WC   metoprolol succinate  50 mg Oral Daily   Continuous Infusions:  sodium chloride 125 mL/hr at 02/28/19 0902   PRN Meds:.albuterol, hydrALAZINE, morphine injection, ondansetron (ZOFRAN) IV ASSESMENT:   *    Acute pancreatitis, presumed biliary. No evidence of choledocholithiasis on imaging. Lipase, LFTs improved.  *     Cholelithiasis.  Surgery planning cholecystectomy, IOC after 5-day Plavix washout.  Last dose of Plavix was a.m. of 02/27/2019.  *   Chronic Plavix for history of drug-eluting stent 2008.  Last dose 02/27/2019.  *     Hypokalemia   PLAN   *     GI will sign off.  Available as needed.  *    GI standpoint okay to advance diet as tolerated but patient currently satisfied with clear liquid so will leave this diet in place.  *     Defer supplementation of potassium to hospitalist.    Jennye MoccasinSarah Treana Lacour  02/28/2019, 10:27 AM Phone [phone]

## 2019-02-28 NOTE — Progress Notes (Signed)
Patient ID: [name], male   DOB: 03/12/1936, 83 y.o.   MRN: [id number]       Subjective: Very pleasant.  States his pain is improving, mostly just sore now.  No nausea.    Objective: Vital signs in last 24 hours: Temp:  [98.6 F (37 C)-99.8 F (37.7 C)] 99.8 F (37.7 C) (09/02 0539) Pulse Rate:  [73-98] 84 (09/02 0539) Resp:  [18-20] 19 (09/02 0539) BP: (150-191)/(79-112) 150/87 (09/02 0539) SpO2:  [96 %-100 %] 100 % (09/02 0539) Last BM Date: 02/26/19  Intake/Output from previous day: 09/01 0701 - 09/02 0700 In: 3150.4 [P.O.:240; I.V.:2910.4] Out: 451 [Urine:451] Intake/Output this shift: No intake/output data recorded.  PE: Abd: soft, mild epigastric tenderness, +BS, ND  Lab Results:  Recent Labs    02/27/19 0239 02/28/19 0220  WBC 11.7* 11.2*  HGB 14.6 13.9  HCT 47.0 43.7  PLT 179 184   BMET Recent Labs    02/27/19 0239 02/28/19 0220  NA 140 138  K 3.8 3.4*  CL 104 105  CO2 21* 24  GLUCOSE 173* 149*  BUN 10 10  CREATININE 1.18 1.05  CALCIUM 9.4 8.8*   PT/INR Recent Labs    02/27/19 0616  LABPROT 13.3  INR 1.0   CMP     Component Value Date/Time   NA 138 02/28/2019 0220   K 3.4 (L) 02/28/2019 0220   CL 105 02/28/2019 0220   CO2 24 02/28/2019 0220   GLUCOSE 149 (H) 02/28/2019 0220   BUN 10 02/28/2019 0220   CREATININE 1.05 02/28/2019 0220   CREATININE 0.92 07/17/2015 1418   CALCIUM 8.8 (L) 02/28/2019 0220   PROT 6.6 02/28/2019 0220   PROT 7.6 10/18/2016 0908   ALBUMIN 3.3 (L) 02/28/2019 0220   ALBUMIN 4.4 10/18/2016 0908   AST 281 (H) 02/28/2019 0220   ALT 537 (H) 02/28/2019 0220   ALKPHOS 89 02/28/2019 0220   BILITOT 1.0 02/28/2019 0220   BILITOT 0.4 10/18/2016 0908   GFRNONAA >60 02/28/2019 0220   GFRAA >60 02/28/2019 0220   Lipase     Component Value Date/Time   LIPASE 1,613 (H) 02/28/2019 0220       Studies/Results: Ct Abdomen Pelvis W Contrast  Result Date: 02/27/2019 CLINICAL DATA:  Acute generalized  abdominal pain, localized periumbilical EXAM: CT ABDOMEN AND PELVIS WITH CONTRAST TECHNIQUE: Multidetector CT imaging of the abdomen and pelvis was performed using the standard protocol following bolus administration of intravenous contrast. CONTRAST:  100mL OMNIPAQUE IOHEXOL 300 MG/ML  SOLN COMPARISON:  Renal ultrasound November 29, 2017 FINDINGS: Lower chest: Basilar areas of subsegmental atelectasis and/or scarring. Cardiac size is upper limits normal. Extensive atherosclerotic calcifications of the coronary arteries. Aortic leaflets are calcified as well. Mild bilateral gynecomastia. Hepatobiliary: Few subcentimeter hypoattenuating foci in segment 4 are too small to fully characterize on CT imaging but statistically likely benign (3/22). No concerning hepatic lesions. Smooth surface contour. The gallbladder contains multiple dependently layering calcified gallstones. No gallbladder wall thickening. Small amount of pericholecystic fluid, nonspecific and possibly tracking from the adjacent pancreas. No biliary ductal dilatation or visible calcified intraductal gallstones. Pancreas: Diffuse edematous thickening of the pancreatic parenchyma without focal hypoattenuation or organized collection. No pancreatic ductal dilatation. Extensive peripancreatic stranding and phlegmon with likely reactive intraperitoneal and retroperitoneal free fluid. Spleen: Normal in size without focal abnormality. Adrenals/Urinary Tract: Normal adrenal glands. Extensive renal vascular calcifications. Kidneys are otherwise unremarkable, without renal calculi, suspicious lesion, or hydronephrosis. Circumferential bladder wall thickening. Indentation of the posterior  bladder wall by the enlarged prostate which demonstrates median lobe hypertrophy. Stomach/Bowel: Distal esophagus is unremarkable. The stomach is distended with ingested material. Mild thickening of the duodenum particularly segments 3 through 4 and of the proximal jejunum just distal  to the ligament of Treitz. No intramural or extraluminal gas. More distal small bowel as in the normal appearance. A normal appendix is visualized. No colonic dilatation or wall thickening. Vascular/Lymphatic: Atherosclerotic plaque within the normal caliber, tortuous aorta. No aneurysm or ectasia. Reactive adenopathy in the upper abdomen. Reproductive: Marked prostatomegaly. Slight asymmetric hypertrophy of the right seminal vesicle, nonspecific. Other: Small volume of intraperitoneal free fluid likely extending from the pancreas to gallbladder fossa and into the right pericolic gutter to layer dependently within the pelvis. No free intraperitoneal air. No bowel containing hernias. Musculoskeletal: Multilevel degenerative changes are present in the imaged portions of the spine. Mild S-shaped curvature of the thoracolumbar spine. Additional degenerative features are present in the SI joints and hips. IMPRESSION: 1. Findings consistent with acute interstitial edematous pancreatitis. No evidence of pancreatic necrosis or organized collection. Likely reactive free fluid seen both in the intraperitoneal and retroperitoneal spaces. 2. Mild thickening of the duodenum particularly segments 3 through 4 and of the proximal jejunum just distal to the ligament of Treitz, may reflect reactive change versus less likely enteritis. 3. Cholelithiasis without CT evidence of acute cholecystitis or CT evident choledocholithiasis. If there is clinical concern for biliary obstruction, right upper quadrant ultrasound or MRCP could be obtained. 4. Marked prostatomegaly. Indentation of the posterior bladder wall by the enlarged prostate which demonstrates median lobe hypertrophy. 5. Circumferential bladder wall thickening, likely secondary to chronic outlet obstruction from the enlarged prostate. Correlate with urinalysis to exclude superimposed cystitis. 6. Aortic Atherosclerosis (ICD10-I70.0). Electronically Signed   By: Lovena Le  M.D.   On: 02/27/2019 05:24   US Abdomen Limited Ruq  Result Date: 02/27/2019 CLINICAL DATA:  Acute pancreatitis.  Gallstones. EXAM: ULTRASOUND ABDOMEN LIMITED RIGHT UPPER QUADRANT COMPARISON:  CT of the abdomen pelvis 02/27/19. FINDINGS: Gallbladder: Multiple shadowing stones are present in the gallbladder. The largest and measures 1.3 cm. Wall thickness is within normal limits at 2.0 mm. There is no sonographic Murphy sign. Common bile duct: Diameter: 4.0 mm, within normal limits Liver: An 8 mm simple cyst in the left lobe of the liver is stable. No other focal lesions are present. Normal echogenicity is present. Portal vein is patent on color Doppler imaging with normal direction of blood flow towards the liver. Other: None. IMPRESSION: 1. Cholelithiasis without cholecystitis. Electronically Signed   By: San Morelle M.D.   On: 02/27/2019 07:33    Anti-infectives: Anti-infectives (From admission, onward)   None       Assessment/Plan CAD s/p stent 2008 - takes plavix, last dose 8/31 HTN GERD HLD  Cholelithiasis Acute pancreatitis  - lipase down to 1600 today from 7000, cont to wait for pancreatitis to improve prior to surgery - AST/ALT elevated but TB normalized at 1. - WBC mildly elevated, patient afebrile, stable - will follow for surgical readiness, but will need to wait out plavix.  Suspect towards the end of the week or weekend prior to being ready for surgery -cont CLD as long as he tolerates with no worsening symptoms  FEN: CLD, IVF VTE: SCDs, lovenox ID: no current abx   LOS: 1 day    Henreitta Cea , [facility] 02/28/2019, 8:30 AM Pager: [phone]

## 2019-03-01 LAB — COMPREHENSIVE METABOLIC PANEL
ALT: 255 U/L — ABNORMAL HIGH (ref 0–44)
AST: 58 U/L — ABNORMAL HIGH (ref 15–41)
Albumin: 3 g/dL — ABNORMAL LOW (ref 3.5–5.0)
Alkaline Phosphatase: 73 U/L (ref 38–126)
Anion gap: 13 (ref 5–15)
BUN: 7 mg/dL — ABNORMAL LOW (ref 8–23)
CO2: 22 mmol/L (ref 22–32)
Calcium: 8.4 mg/dL — ABNORMAL LOW (ref 8.9–10.3)
Chloride: 102 mmol/L (ref 98–111)
Creatinine, Ser: 1.09 mg/dL (ref 0.61–1.24)
GFR calc Af Amer: >60 mL/min (ref >60)
GFR calc non Af Amer: >60 mL/min (ref >60)
Glucose, Bld: 126 mg/dL — ABNORMAL HIGH (ref 70–99)
Potassium: 3 mmol/L — ABNORMAL LOW (ref 3.5–5.1)
Sodium: 137 mmol/L (ref 135–145)
Total Bilirubin: 1.2 mg/dL (ref 0.3–1.2)
Total Protein: 6 g/dL — ABNORMAL LOW (ref 6.5–8.1)

## 2019-03-01 LAB — CBC
HCT: 38.1 % — ABNORMAL LOW (ref 39.0–52.0)
Hemoglobin: 12.3 g/dL — ABNORMAL LOW (ref 13.0–17.0)
MCH: 26.7 pg (ref 26.0–34.0)
MCHC: 32.3 g/dL (ref 30.0–36.0)
MCV: 82.8 fL (ref 80.0–100.0)
Platelets: 142 10*3/uL — ABNORMAL LOW (ref 150–400)
RBC: 4.6 MIL/uL (ref 4.22–5.81)
RDW: 14.2 % (ref 11.5–15.5)
WBC: 10.9 10*3/uL — ABNORMAL HIGH (ref 4.0–10.5)
nRBC: 0 % (ref 0.0–0.2)

## 2019-03-01 LAB — GLUCOSE, CAPILLARY
Glucose-Capillary: 129 mg/dL — ABNORMAL HIGH (ref 70–99)
Glucose-Capillary: 151 mg/dL — ABNORMAL HIGH (ref 70–99)
Glucose-Capillary: 119 mg/dL — ABNORMAL HIGH (ref 70–99)
Glucose-Capillary: 128 mg/dL — ABNORMAL HIGH (ref 70–99)

## 2019-03-01 LAB — LIPASE, BLOOD: Lipase: 143 U/L — ABNORMAL HIGH (ref 11–51)

## 2019-03-01 MED ORDER — POTASSIUM CHLORIDE CRYS ER 20 MEQ PO TBCR
40.0000 meq | EXTENDED_RELEASE_TABLET | ORAL | Status: AC
  Administered 2019-03-01 (×2): 40 meq via ORAL
  Filled 2019-03-01 (×2): qty 2

## 2019-03-01 NOTE — Progress Notes (Signed)
PROGRESS NOTE    [name]  IRJ:[id number] DOB: 10-25-1935 DOA: 02/27/2019 PCP: Rometta Emery, MD   Brief Narrative:  83 year old with history of essential hypertension, coronary artery disease status post stent, hyperlipidemia presented with abdominal pain.  He was found to have acute pancreatitis.  Lipase was greater than 7000, CT abdomen pelvis showed acute interstitial edematous pancreatitis with cholelithiasis.   Assessment & Plan:   Principal Problem:   Pancreatitis Active Problems:   HLD (hyperlipidemia)   Type 2 diabetes mellitus without complication (HCC)   Cholelithiasis   Leukocytosis   Nausea and vomiting   CAD (coronary artery disease)   Acute gallstone pancreatitis -Continue IV fluids and clear liquid diet -Pain control -GI and general surgery following -Plans for laparoscopic cholecystectomy-hopefully next 24 hours.  Transaminitis/elevated total bilirubin, improving - Greatly improved. -Right upper quadrant ultrasound shows gallstones Essential hypertension -Norvasc 5 mg daily -Metoprolol 50 mg daily  Hypokalemia -Repletion ordered  Coronary artery disease -Plavix on hold.  No chest pain  Diabetes mellitus type 2 - Insulin sliding scale and Accu-Chek.  A1c 6.5 -Metformin on hold.  Hyperlipidemia - Statins on hold due to abnormal LFTs.  Resume when appropriate   DVT prophylaxis: Lovenox Code Status:   Full code Family Communication:   None at bedside, I offered to call his son but he states he will relay the information Disposition Plan:  Maintain hospital stay until surgical intervention.  Surgical intervention once his pancreatitis has improved  Consultants:    GI   General surgery  Procedures:    none  Antimicrobials:    none   Subjective: Feels a little better today.  No complaints. Review of Systems Otherwise negative except as per HPI, including: General = no fevers, chills, dizziness, malaise, fatigue HEENT/EYES  = negative for pain, redness, loss of vision, double vision, blurred vision, loss of hearing, sore throat, hoarseness, dysphagia Cardiovascular= negative for chest pain, palpitation, murmurs, lower extremity swelling Respiratory/lungs= negative for shortness of breath, cough, hemoptysis, wheezing, mucus production Gastrointestinal= negative for nausea, vomiting,, abdominal pain, melena, hematemesis Genitourinary= negative for Dysuria, Hematuria, Change in Urinary Frequency MSK = Negative for arthralgia, myalgias, Back Pain, Joint swelling  Neurology= Negative for headache, seizures, numbness, tingling  Psychiatry= Negative for anxiety, depression, suicidal and homocidal ideation Allergy/Immunology= Medication/Food allergy as listed  Skin= Negative for Rash, lesions, ulcers, itching   Objective: Vitals:   02/28/19 0539 02/28/19 1343 02/28/19 2126 03/01/19 0610  BP: (!) 150/87 (!) 158/93 (!) 148/78 (!) 141/75  Pulse: 84 80 80 82  Resp: 19 17 18 18   Temp: 99.8 F (37.7 C) 98 F (36.7 C) 99.7 F (37.6 C) 98.7 F (37.1 C)  TempSrc: Oral Oral Oral Oral  SpO2: 100% 97% 97% 98%    Intake/Output Summary (Last 24 hours) at 03/01/2019 1115 Last data filed at 03/01/2019 1017 Gross per 24 hour  Intake 2236.39 ml  Output 1800 ml  Net 436.39 ml   There were no vitals filed for this visit.  Examination:  Constitutional: NAD, calm, comfortable Eyes: PERRL, lids and conjunctivae normal ENMT: Mucous membranes are moist. Posterior pharynx clear of any exudate or lesions.Normal dentition.  Neck: normal, supple, no masses, no thyromegaly Respiratory: clear to auscultation bilaterally, no wheezing, no crackles. Normal respiratory effort. No accessory muscle use.  Cardiovascular: Regular rate and rhythm, no murmurs / rubs / gallops. No extremity edema. 2+ pedal pulses. No carotid bruits.  Abdomen: no tenderness, no masses palpated. No hepatosplenomegaly. Bowel sounds positive.  Musculoskeletal: no  clubbing / cyanosis. No joint deformity upper and lower extremities. Good ROM, no contractures. Normal muscle tone.  Skin: no rashes, lesions, ulcers. No induration Neurologic: CN 2-12 grossly intact. Sensation intact, DTR normal. Strength 5/5 in all 4.  Psychiatric: Normal judgment and insight. Alert and oriented x 3. Normal mood.   Data Reviewed:   CBC: Recent Labs  Lab 02/27/19 0239 02/28/19 0220 03/01/19 0734  WBC 11.7* 11.2* 10.9*  NEUTROABS 10.0*  --   --   HGB 14.6 13.9 12.3*  HCT 47.0 43.7 38.1*  MCV 86.1 84.0 82.8  PLT 179 184 142*   Basic Metabolic Panel: Recent Labs  Lab 02/27/19 0239 02/28/19 0220 03/01/19 0734  NA 140 138 137  K 3.8 3.4* 3.0*  CL 104 105 102  CO2 21* 24 22  GLUCOSE 173* 149* 126*  BUN 10 10 7*  CREATININE 1.18 1.05 1.09  CALCIUM 9.4 8.8* 8.4*   GFR: CrCl cannot be calculated (Unknown ideal weight.). Liver Function Tests: Recent Labs  Lab 02/27/19 0239 02/28/19 0220 03/01/19 0734  AST 1,195* 281* 58*  ALT 843* 537* 255*  ALKPHOS 115 89 73  BILITOT 2.6* 1.0 1.2  PROT 7.1 6.6 6.0*  ALBUMIN 3.8 3.3* 3.0*   Recent Labs  Lab 02/27/19 0239 02/28/19 0220 03/01/19 0734  LIPASE 7,221* 1,613* 143*   No results for input(s): AMMONIA in the last 168 hours. Coagulation Profile: Recent Labs  Lab 02/27/19 0616  INR 1.0   Cardiac Enzymes: No results for input(s): CKTOTAL, CKMB, CKMBINDEX, TROPONINI in the last 168 hours. BNP (last 3 results) No results for input(s): PROBNP in the last 8760 hours. HbA1C: Recent Labs    02/27/19 0832  HGBA1C 6.5*   CBG: Recent Labs  Lab 02/28/19 0811 02/28/19 1211 02/28/19 1759 02/28/19 2125 03/01/19 0755  GLUCAP 135* 156* 138* 138* 129*   Lipid Profile: Recent Labs    02/27/19 0616  TRIG 57   Thyroid Function Tests: No results for input(s): TSH, T4TOTAL, FREET4, T3FREE, THYROIDAB in the last 72 hours. Anemia Panel: No results for input(s): VITAMINB12, FOLATE, FERRITIN, TIBC,  IRON, RETICCTPCT in the last 72 hours. Sepsis Labs: No results for input(s): PROCALCITON, LATICACIDVEN in the last 168 hours.  Recent Results (from the past 240 hour(s))  SARS Coronavirus 2 [facility](Hospital order, Performed in [facility] lab) Nasopharyngeal Nasopharyngeal Swab     Status: None   Collection Time: 02/27/19  5:49 AM   Specimen: Nasopharyngeal Swab  Result Value Ref Range Status   SARS Coronavirus 2 NEGATIVE NEGATIVE Final    Comment: (NOTE) If result is NEGATIVE SARS-CoV-2 target nucleic acids are NOT DETECTED. The SARS-CoV-2 RNA is generally detectable in upper and lower  respiratory specimens during the acute phase of infection. The lowest  concentration of SARS-CoV-2 viral copies this assay can detect is 250  copies / mL. A negative result does not preclude SARS-CoV-2 infection  and should not be used as the sole basis for treatment or other  patient management decisions.  A negative result may occur with  improper specimen collection / handling, submission of specimen other  than nasopharyngeal swab, presence of viral mutation(s) within the  areas targeted by this assay, and inadequate number of viral copies  (<250 copies / mL). A negative result must be combined with clinical  observations, patient history, and epidemiological information. If result is POSITIVE SARS-CoV-2 target nucleic acids are DETECTED. The SARS-CoV-2 RNA is generally detectable in upper and lower  respiratory  specimens dur ing the acute phase of infection.  Positive  results are indicative of active infection with SARS-CoV-2.  Clinical  correlation with patient history and other diagnostic information is  necessary to determine patient infection status.  Positive results do  not rule out bacterial infection or co-infection with other viruses. If result is PRESUMPTIVE POSTIVE SARS-CoV-2 nucleic acids MAY BE PRESENT.   A presumptive positive result was obtained on the submitted specimen   and confirmed on repeat testing.  While 2019 novel coronavirus  (SARS-CoV-2) nucleic acids may be present in the submitted sample  additional confirmatory testing may be necessary for epidemiological  and / or clinical management purposes  to differentiate between  SARS-CoV-2 and other Sarbecovirus currently known to infect humans.  If clinically indicated additional testing with an alternate test  methodology [phone]) is advised. The SARS-CoV-2 RNA is generally  detectable in upper and lower respiratory sp ecimens during the acute  phase of infection. The expected result is Negative. Fact Sheet for Patients:  StrictlyIdeas.no Fact Sheet for Healthcare Providers: BankingDealers.co.za This test is not yet approved or cleared by the [place] FDA and has been authorized for detection and/or diagnosis of SARS-CoV-2 by FDA under an Emergency Use Authorization (EUA).  This EUA will remain in effect (meaning this test can be used) for the duration of the COVID-19 declaration under Section 564(b)(1) of the Act, 21 U.S.C. section 360bbb-3(b)(1), unless the authorization is terminated or revoked sooner. Performed at [facility], [place] [address] [place], [place] [address]          Radiology Studies: No results found.      Scheduled Meds: . amLODipine  5 mg Oral Daily  . enoxaparin (LOVENOX) injection  40 mg Subcutaneous Daily  . insulin aspart  0-9 Units Subcutaneous TID WC  . metoprolol succinate  50 mg Oral Daily   Continuous Infusions: . sodium chloride 125 mL/hr at 03/01/19 0610     LOS: 2 days   Time spent= 25 mins    Obelia Bonello Arsenio Loader, MD [facility]  If 7PM-7AM, please contact night-coverage [email or web address] 03/01/2019, 11:15 AM

## 2019-03-01 NOTE — Progress Notes (Signed)
Patient ID: [name], male   DOB: 04/13/1936, 83 y.o.   MRN: [id number]       Subjective: Patient feels better today.  Eating clear liquids and tolerating well.  Objective: Vital signs in last 24 hours: Temp:  [98 F (36.7 C)-99.7 F (37.6 C)] 98.7 F (37.1 C) (09/03 0610) Pulse Rate:  [80-82] 82 (09/03 0610) Resp:  [17-18] 18 (09/03 0610) BP: (141-158)/(75-93) 141/75 (09/03 0610) SpO2:  [97 %-98 %] 98 % (09/03 0610) Last BM Date: 02/26/19  Intake/Output from previous day: 09/02 0701 - 09/03 0700 In: 2236.4 [P.O.:120; I.V.:2116.4] Out: 1400 [Urine:1400] Intake/Output this shift: No intake/output data recorded.  PE: Abd: soft, minimally tender in LUQ, +BS, ND  Lab Results:  Recent Labs    02/28/19 0220 03/01/19 0734  WBC 11.2* 10.9*  HGB 13.9 12.3*  HCT 43.7 38.1*  PLT 184 142*   BMET Recent Labs    02/28/19 0220 03/01/19 0734  NA 138 137  K 3.4* 3.0*  CL 105 102  CO2 24 22  GLUCOSE 149* 126*  BUN 10 7*  CREATININE 1.05 1.09  CALCIUM 8.8* 8.4*   PT/INR Recent Labs    02/27/19 0616  LABPROT 13.3  INR 1.0   CMP     Component Value Date/Time   NA 137 03/01/2019 0734   K 3.0 (L) 03/01/2019 0734   CL 102 03/01/2019 0734   CO2 22 03/01/2019 0734   GLUCOSE 126 (H) 03/01/2019 0734   BUN 7 (L) 03/01/2019 0734   CREATININE 1.09 03/01/2019 0734   CREATININE 0.92 07/17/2015 1418   CALCIUM 8.4 (L) 03/01/2019 0734   PROT 6.0 (L) 03/01/2019 0734   PROT 7.6 10/18/2016 0908   ALBUMIN 3.0 (L) 03/01/2019 0734   ALBUMIN 4.4 10/18/2016 0908   AST 58 (H) 03/01/2019 0734   ALT 255 (H) 03/01/2019 0734   ALKPHOS 73 03/01/2019 0734   BILITOT 1.2 03/01/2019 0734   BILITOT 0.4 10/18/2016 0908   GFRNONAA >60 03/01/2019 0734   GFRAA >60 03/01/2019 0734   Lipase     Component Value Date/Time   LIPASE 143 (H) 03/01/2019 0734       Studies/Results: No results found.  Anti-infectives: Anti-infectives (From admission, onward)   None        Assessment/Plan CAD s/p stent 2008- takes plavix, last dose 9/1 HTN GERD HLD  Biliary pancreatitis  - lipase Buck to 100s today and LFTs downtrending as well - WBC mildly elevated, patient afebrile, stable -patient clinically about ready for OR.  Will discuss with MD regarding readiness and plavix.  Last dose was on 9/1. -cont CLD as long as he tolerates with no worsening symptoms  FEN:CLD, IVF VTE: SCDs, lovenox ID: no current abx   LOS: 2 days    Henreitta Cea , [facility] 03/01/2019, 9:44 AM Pager: [phone]

## 2019-03-02 LAB — COMPREHENSIVE METABOLIC PANEL
ALT: 180 U/L — ABNORMAL HIGH (ref 0–44)
AST: 31 U/L (ref 15–41)
Albumin: 2.8 g/dL — ABNORMAL LOW (ref 3.5–5.0)
Alkaline Phosphatase: 73 U/L (ref 38–126)
Anion gap: 8 (ref 5–15)
BUN: 5 mg/dL — ABNORMAL LOW (ref 8–23)
CO2: 23 mmol/L (ref 22–32)
Calcium: 8.4 mg/dL — ABNORMAL LOW (ref 8.9–10.3)
Chloride: 107 mmol/L (ref 98–111)
Creatinine, Ser: 1.04 mg/dL (ref 0.61–1.24)
GFR calc Af Amer: >60 mL/min (ref >60)
GFR calc non Af Amer: >60 mL/min (ref >60)
Glucose, Bld: 128 mg/dL — ABNORMAL HIGH (ref 70–99)
Potassium: 3.5 mmol/L (ref 3.5–5.1)
Sodium: 138 mmol/L (ref 135–145)
Total Bilirubin: 1.3 mg/dL — ABNORMAL HIGH (ref 0.3–1.2)
Total Protein: 6.1 g/dL — ABNORMAL LOW (ref 6.5–8.1)

## 2019-03-02 LAB — SURGICAL PCR SCREEN
MRSA, PCR: NEGATIVE
Staphylococcus aureus: NEGATIVE

## 2019-03-02 LAB — CBC
HCT: 37.1 % — ABNORMAL LOW (ref 39.0–52.0)
Hemoglobin: 12 g/dL — ABNORMAL LOW (ref 13.0–17.0)
MCH: 26.8 pg (ref 26.0–34.0)
MCHC: 32.3 g/dL (ref 30.0–36.0)
MCV: 83 fL (ref 80.0–100.0)
Platelets: 149 10*3/uL — ABNORMAL LOW (ref 150–400)
RBC: 4.47 MIL/uL (ref 4.22–5.81)
RDW: 14.3 % (ref 11.5–15.5)
WBC: 8.3 10*3/uL (ref 4.0–10.5)
nRBC: 0 % (ref 0.0–0.2)

## 2019-03-02 LAB — GLUCOSE, CAPILLARY
Glucose-Capillary: 111 mg/dL — ABNORMAL HIGH (ref 70–99)
Glucose-Capillary: 111 mg/dL — ABNORMAL HIGH (ref 70–99)
Glucose-Capillary: 110 mg/dL — ABNORMAL HIGH (ref 70–99)
Glucose-Capillary: 94 mg/dL (ref 70–99)

## 2019-03-02 LAB — MAGNESIUM: Magnesium: 1.9 mg/dL (ref 1.7–2.4)

## 2019-03-02 MED ORDER — POTASSIUM CHLORIDE CRYS ER 20 MEQ PO TBCR
40.0000 meq | EXTENDED_RELEASE_TABLET | Freq: Once | ORAL | Status: AC
  Administered 2019-03-02: 40 meq via ORAL
  Filled 2019-03-02: qty 2

## 2019-03-02 NOTE — Progress Notes (Signed)
Patient ID: LEMAR BAKOS, male   DOB: 08-Jan-1936, 83 y.o.   MRN: [id number]       Subjective: Patient feels well.  No complaints.  Tolerating clear liquids.  Objective: Vital signs in last 24 hours: Temp:  [98.1 F (36.7 C)-100 F (37.8 C)] 98.1 F (36.7 C) (09/04 0615) Pulse Rate:  [75-79] 79 (09/04 0615) Resp:  [18] 18 (09/04 0615) BP: (148-167)/(76-79) 162/76 (09/04 0615) SpO2:  [98 %-100 %] 99 % (09/04 0615) Last BM Date: 02/26/19  Intake/Output from previous day: 09/03 0701 - 09/04 0700 In: -  Out: 1875 [Urine:1875] Intake/Output this shift: No intake/output data recorded.  PE: Heart: regular Lungs: CTAB Abd: soft, NT, Nd, +BS  Lab Results:  Recent Labs    03/01/19 0734 03/02/19 0310  WBC 10.9* 8.3  HGB 12.3* 12.0*  HCT 38.1* 37.1*  PLT 142* 149*   BMET Recent Labs    03/01/19 0734 03/02/19 0310  NA 137 138  K 3.0* 3.5  CL 102 107  CO2 22 23  GLUCOSE 126* 128*  BUN 7* 5*  CREATININE 1.09 1.04  CALCIUM 8.4* 8.4*   PT/INR No results for input(s): LABPROT, INR in the last 72 hours. CMP     Component Value Date/Time   NA 138 03/02/2019 0310   K 3.5 03/02/2019 0310   CL 107 03/02/2019 0310   CO2 23 03/02/2019 0310   GLUCOSE 128 (H) 03/02/2019 0310   BUN 5 (L) 03/02/2019 0310   CREATININE 1.04 03/02/2019 0310   CREATININE 0.92 07/17/2015 1418   CALCIUM 8.4 (L) 03/02/2019 0310   PROT 6.1 (L) 03/02/2019 0310   PROT 7.6 10/18/2016 0908   ALBUMIN 2.8 (L) 03/02/2019 0310   ALBUMIN 4.4 10/18/2016 0908   AST 31 03/02/2019 0310   ALT 180 (H) 03/02/2019 0310   ALKPHOS 73 03/02/2019 0310   BILITOT 1.3 (H) 03/02/2019 0310   BILITOT 0.4 10/18/2016 0908   GFRNONAA >60 03/02/2019 0310   GFRAA >60 03/02/2019 0310   Lipase     Component Value Date/Time   LIPASE 143 (H) 03/01/2019 0734       Studies/Results: No results found.  Anti-infectives: Anti-infectives (From admission, onward)   None       Assessment/Plan CAD s/p stent 2008-  takes plavix, last dose 8/31 HTN GERD HLD  Biliary pancreatitis - lipase down to 140 yesterday.  No remaining symptoms of pancreatitis at this time -plan for lap chole tomorrow as it will be 5 days since last dose of plavix -CLD today, NPO P MN  FEN:CLD, IVF, NPO p MN VTE: SCDs, lovenox ID: no current abx   LOS: 3 days    Henreitta Cea , [facility] 03/02/2019, 11:49 AM Pager: [phone]

## 2019-03-02 NOTE — Care Management Important Message (Signed)
Important Message  Patient Details  Name: [name] MRN: [id number] Date of Birth: 05-05-1936   Medicare Important Message Given:  Yes     Memory Argue 03/02/2019, 3:20 PM

## 2019-03-02 NOTE — Progress Notes (Signed)
PROGRESS NOTE    Allen ForemanJohn L Buck  ZOX:[id number] DOB: 1935-11-18 DOA: 02/27/2019 PCP: Rometta EmeryGarba, Mohammad L, MD   Brief Narrative:  83 year old with history of essential hypertension, coronary artery disease status post stent, hyperlipidemia presented with abdominal pain.  He was found to have acute pancreatitis.  Lipase was greater than 7000, CT abdomen pelvis showed acute interstitial edematous pancreatitis with cholelithiasis.  Has been off Plavix now.  LFTs and lipase improved.   Assessment & Plan:   Principal Problem:   Pancreatitis Active Problems:   HLD (hyperlipidemia)   Type 2 diabetes mellitus without complication (HCC)   Cholelithiasis   Leukocytosis   Nausea and vomiting   CAD (coronary artery disease)   Acute gallstone pancreatitis -Continue IV fluids, clear liquid diet.  N.p.o. past midnight.  Plans for laparoscopic cholecystectomy tomorrow.  Of Plavix for 4 days now  Transaminitis/elevated total bilirubin, improving - Improved -Right upper quadrant ultrasound shows gallstones  Essential hypertension -Norvasc 5 mg daily -Metoprolol 50 mg daily  Hypokalemia -Repletion ordered  Coronary artery disease -Plavix on hold.  No chest pain  Diabetes mellitus type 2 - Insulin sliding scale and Accu-Chek.  A1c 6.5 -Metformin on hold.  Hyperlipidemia - Statins on hold due to abnormal LFTs.  Resume when appropriate   DVT prophylaxis: Lovenox Code Status:   Full code Family Communication:   None at bedside. Disposition Plan: Maintain hospital stay for surgical intervention planned tomorrow  Consultants:    GI   General surgery  Procedures:    none  Antimicrobials:    none   Subjective: Feels better, does not any complaints.  No acute events overnight.  Review of Systems Otherwise negative except as per HPI, including: General = no fevers, chills, dizziness, malaise, fatigue HEENT/EYES = negative for pain, redness, loss of vision, double vision,  blurred vision, loss of hearing, sore throat, hoarseness, dysphagia Cardiovascular= negative for chest pain, palpitation, murmurs, lower extremity swelling Respiratory/lungs= negative for shortness of breath, cough, hemoptysis, wheezing, mucus production Gastrointestinal= negative for nausea, vomiting,, abdominal pain, melena, hematemesis Genitourinary= negative for Dysuria, Hematuria, Change in Urinary Frequency MSK = Negative for arthralgia, myalgias, Back Pain, Joint swelling  Neurology= Negative for headache, seizures, numbness, tingling  Psychiatry= Negative for anxiety, depression, suicidal and homocidal ideation Allergy/Immunology= Medication/Food allergy as listed  Skin= Negative for Rash, lesions, ulcers, itching    Objective: Vitals:   03/01/19 0610 03/01/19 1354 03/01/19 2130 03/02/19 0615  BP: (!) 141/75 (!) 148/78 (!) 167/79 (!) 162/76  Pulse: 82 76 75 79  Resp: 18 18 18 18   Temp: 98.7 F (37.1 C) 98.8 F (37.1 C) 100 F (37.8 C) 98.1 F (36.7 C)  TempSrc: Oral Oral Oral Oral  SpO2: 98% 98% 100% 99%    Intake/Output Summary (Last 24 hours) at 03/02/2019 1223 Last data filed at 03/02/2019 [id number] Gross per 24 hour  Intake --  Output 1275 ml  Net -1275 ml   There were no vitals filed for this visit.  Examination:  Constitutional: NAD, calm, comfortable Eyes: PERRL, lids and conjunctivae normal ENMT: Mucous membranes are moist. Posterior pharynx clear of any exudate or lesions.Normal dentition.  Neck: normal, supple, no masses, no thyromegaly Respiratory: clear to auscultation bilaterally, no wheezing, no crackles. Normal respiratory effort. No accessory muscle use.  Cardiovascular: Regular rate and rhythm, no murmurs / rubs / gallops. No extremity edema. 2+ pedal pulses. No carotid bruits.  Abdomen: no tenderness, no masses palpated. No hepatosplenomegaly. Bowel sounds positive.  Musculoskeletal: no clubbing /  cyanosis. No joint deformity upper and lower extremities.  Good ROM, no contractures. Normal muscle tone.  Skin: no rashes, lesions, ulcers. No induration Neurologic: CN 2-12 grossly intact. Sensation intact, DTR normal. Strength 5/5 in all 4.  Psychiatric: Normal judgment and insight. Alert and oriented x 3. Normal mood.   Data Reviewed:   CBC: Recent Labs  Lab 02/27/19 0239 02/28/19 0220 03/01/19 0734 03/02/19 0310  WBC 11.7* 11.2* 10.9* 8.3  NEUTROABS 10.0*  --   --   --   HGB 14.6 13.9 12.3* 12.0*  HCT 47.0 43.7 38.1* 37.1*  MCV 86.1 84.0 82.8 83.0  PLT 179 184 142* 149*   Basic Metabolic Panel: Recent Labs  Lab 02/27/19 0239 02/28/19 0220 03/01/19 0734 03/02/19 0310  NA 140 138 137 138  K 3.8 3.4* 3.0* 3.5  CL 104 105 102 107  CO2 21* 24 22 23   GLUCOSE 173* 149* 126* 128*  BUN 10 10 7* 5*  CREATININE 1.18 1.05 1.09 1.04  CALCIUM 9.4 8.8* 8.4* 8.4*  MG  --   --   --  1.9   GFR: CrCl cannot be calculated (Unknown ideal weight.). Liver Function Tests: Recent Labs  Lab 02/27/19 0239 02/28/19 0220 03/01/19 0734 03/02/19 0310  AST 1,195* 281* 58* 31  ALT 843* 537* 255* 180*  ALKPHOS 115 89 73 73  BILITOT 2.6* 1.0 1.2 1.3*  PROT 7.1 6.6 6.0* 6.1*  ALBUMIN 3.8 3.3* 3.0* 2.8*   Recent Labs  Lab 02/27/19 0239 02/28/19 0220 03/01/19 0734  LIPASE 7,221* 1,613* 143*   No results for input(s): AMMONIA in the last 168 hours. Coagulation Profile: Recent Labs  Lab 02/27/19 0616  INR 1.0   Cardiac Enzymes: No results for input(s): CKTOTAL, CKMB, CKMBINDEX, TROPONINI in the last 168 hours. BNP (last 3 results) No results for input(s): PROBNP in the last 8760 hours. HbA1C: No results for input(s): HGBA1C in the last 72 hours. CBG: Recent Labs  Lab 03/01/19 1202 03/01/19 1714 03/01/19 2126 03/02/19 0810 03/02/19 1157  GLUCAP 151* 119* 128* 111* 111*   Lipid Profile: No results for input(s): CHOL, HDL, LDLCALC, TRIG, CHOLHDL, LDLDIRECT in the last 72 hours. Thyroid Function Tests: No results for input(s):  TSH, T4TOTAL, FREET4, T3FREE, THYROIDAB in the last 72 hours. Anemia Panel: No results for input(s): VITAMINB12, FOLATE, FERRITIN, TIBC, IRON, RETICCTPCT in the last 72 hours. Sepsis Labs: No results for input(s): PROCALCITON, LATICACIDVEN in the last 168 hours.  Recent Results (from the past 240 hour(s))  SARS Coronavirus 2 [facility] order, Performed in [facility] hospital lab) Nasopharyngeal Nasopharyngeal Swab     Status: None   Collection Time: 02/27/19  5:49 AM   Specimen: Nasopharyngeal Swab  Result Value Ref Range Status   SARS Coronavirus 2 NEGATIVE NEGATIVE Final    Comment: (NOTE) If result is NEGATIVE SARS-CoV-2 target nucleic acids are NOT DETECTED. The SARS-CoV-2 RNA is generally detectable in upper and lower  respiratory specimens during the acute phase of infection. The lowest  concentration of SARS-CoV-2 viral copies this assay can detect is 250  copies / mL. A negative result does not preclude SARS-CoV-2 infection  and should not be used as the sole basis for treatment or other  patient management decisions.  A negative result may occur with  improper specimen collection / handling, submission of specimen other  than nasopharyngeal swab, presence of viral mutation(s) within the  areas targeted by this assay, and inadequate number of viral copies  (<250 copies / mL).  A negative result must be combined with clinical  observations, patient history, and epidemiological information. If result is POSITIVE SARS-CoV-2 target nucleic acids are DETECTED. The SARS-CoV-2 RNA is generally detectable in upper and lower  respiratory specimens dur ing the acute phase of infection.  Positive  results are indicative of active infection with SARS-CoV-2.  Clinical  correlation with patient history and other diagnostic information is  necessary to determine patient infection status.  Positive results do  not rule out bacterial infection or co-infection with other viruses. If result is  PRESUMPTIVE POSTIVE SARS-CoV-2 nucleic acids MAY BE PRESENT.   A presumptive positive result was obtained on the submitted specimen  and confirmed on repeat testing.  While 2019 novel coronavirus  (SARS-CoV-2) nucleic acids may be present in the submitted sample  additional confirmatory testing may be necessary for epidemiological  and / or clinical management purposes  to differentiate between  SARS-CoV-2 and other Sarbecovirus currently known to infect humans.  If clinically indicated additional testing with an alternate test  methodology [phone]) is advised. The SARS-CoV-2 RNA is generally  detectable in upper and lower respiratory sp ecimens during the acute  phase of infection. The expected result is Negative. Fact Sheet for Patients:  StrictlyIdeas.no Fact Sheet for Healthcare Providers: BankingDealers.co.za This test is not yet approved or cleared by the [place] FDA and has been authorized for detection and/or diagnosis of SARS-CoV-2 by FDA under an Emergency Use Authorization (EUA).  This EUA will remain in effect (meaning this test can be used) for the duration of the COVID-19 declaration under Section 564(b)(1) of the Act, 21 U.S.C. section 360bbb-3(b)(1), unless the authorization is terminated or revoked sooner. Performed at [facility], [place] [address] [place], [place] [address]          Radiology Studies: No results found.      Scheduled Meds:  amLODipine  5 mg Oral Daily   enoxaparin (LOVENOX) injection  40 mg Subcutaneous Daily   insulin aspart  0-9 Units Subcutaneous TID WC   metoprolol succinate  50 mg Oral Daily   Continuous Infusions:  sodium chloride 125 mL/hr at 03/02/19 0523     LOS: 3 days   Time spent= 25 mins    Peace Jost Arsenio Loader, MD [facility]  If 7PM-7AM, please contact night-coverage [email or web address] 03/02/2019, 12:23 PM

## 2019-03-03 LAB — COMPREHENSIVE METABOLIC PANEL
ALT: 132 U/L — ABNORMAL HIGH (ref 0–44)
AST: 20 U/L (ref 15–41)
Albumin: 2.9 g/dL — ABNORMAL LOW (ref 3.5–5.0)
Alkaline Phosphatase: 68 U/L (ref 38–126)
Anion gap: 10 (ref 5–15)
BUN: 5 mg/dL — ABNORMAL LOW (ref 8–23)
CO2: 23 mmol/L (ref 22–32)
Calcium: 8.7 mg/dL — ABNORMAL LOW (ref 8.9–10.3)
Chloride: 105 mmol/L (ref 98–111)
Creatinine, Ser: 0.98 mg/dL (ref 0.61–1.24)
GFR calc Af Amer: >60 mL/min (ref >60)
GFR calc non Af Amer: >60 mL/min (ref >60)
Glucose, Bld: 110 mg/dL — ABNORMAL HIGH (ref 70–99)
Potassium: 3.2 mmol/L — ABNORMAL LOW (ref 3.5–5.1)
Sodium: 138 mmol/L (ref 135–145)
Total Bilirubin: 0.8 mg/dL (ref 0.3–1.2)
Total Protein: 6.4 g/dL — ABNORMAL LOW (ref 6.5–8.1)

## 2019-03-03 LAB — MAGNESIUM: Magnesium: 2 mg/dL (ref 1.7–2.4)

## 2019-03-03 LAB — CBC
HCT: 37.5 % — ABNORMAL LOW (ref 39.0–52.0)
Hemoglobin: 12.2 g/dL — ABNORMAL LOW (ref 13.0–17.0)
MCH: 27.1 pg (ref 26.0–34.0)
MCHC: 32.5 g/dL (ref 30.0–36.0)
MCV: 83.1 fL (ref 80.0–100.0)
Platelets: 163 10*3/uL (ref 150–400)
RBC: 4.51 MIL/uL (ref 4.22–5.81)
RDW: 14.4 % (ref 11.5–15.5)
WBC: 6.7 10*3/uL (ref 4.0–10.5)
nRBC: 0 % (ref 0.0–0.2)

## 2019-03-03 LAB — GLUCOSE, CAPILLARY
Glucose-Capillary: 111 mg/dL — ABNORMAL HIGH (ref 70–99)
Glucose-Capillary: 118 mg/dL — ABNORMAL HIGH (ref 70–99)
Glucose-Capillary: 109 mg/dL — ABNORMAL HIGH (ref 70–99)
Glucose-Capillary: 176 mg/dL — ABNORMAL HIGH (ref 70–99)

## 2019-03-03 MED ORDER — POTASSIUM CHLORIDE 10 MEQ/100ML IV SOLN
10.0000 meq | INTRAVENOUS | Status: AC
  Administered 2019-03-03 (×4): 10 meq via INTRAVENOUS
  Filled 2019-03-03 (×4): qty 100

## 2019-03-03 NOTE — Progress Notes (Signed)
Patient ID: [name], male   DOB: 1936/02/24, 83 y.o.   MRN: [id number]       Subjective: Pt denies pain.  No n/v.    Objective: Vital signs in last 24 hours: Temp:  [98.4 F (36.9 C)-98.9 F (37.2 C)] 98.7 F (37.1 C) (09/05 1020) Pulse Rate:  [66-77] 69 (09/05 1020) Resp:  [18] 18 (09/05 0500) BP: (142-187)/(82-92) 169/92 (09/05 1020) SpO2:  [99 %-100 %] 99 % (09/05 1020) Last BM Date: 03/03/19  Intake/Output from previous day: 09/04 0701 - 09/05 0700 In: 470 [P.O.:470] Out: 400 [Urine:400] Intake/Output this shift: Total I/O In: 0  Out: 1300 [Urine:1300]  PE: Heart: regular Lungs: breathing comfortably Abd: soft, NT, Nd Gen:  NAD  Lab Results:  Recent Labs    03/02/19 0310 03/03/19 0328  WBC 8.3 6.7  HGB 12.0* 12.2*  HCT 37.1* 37.5*  PLT 149* 163   BMET Recent Labs    03/02/19 0310 03/03/19 0328  NA 138 138  K 3.5 3.2*  CL 107 105  CO2 23 23  GLUCOSE 128* 110*  BUN 5* 5*  CREATININE 1.04 0.98  CALCIUM 8.4* 8.7*   PT/INR No results for input(s): LABPROT, INR in the last 72 hours. CMP     Component Value Date/Time   NA 138 03/03/2019 0328   K 3.2 (L) 03/03/2019 0328   CL 105 03/03/2019 0328   CO2 23 03/03/2019 0328   GLUCOSE 110 (H) 03/03/2019 0328   BUN 5 (L) 03/03/2019 0328   CREATININE 0.98 03/03/2019 0328   CREATININE 0.92 07/17/2015 1418   CALCIUM 8.7 (L) 03/03/2019 0328   PROT 6.4 (L) 03/03/2019 0328   PROT 7.6 10/18/2016 0908   ALBUMIN 2.9 (L) 03/03/2019 0328   ALBUMIN 4.4 10/18/2016 0908   AST 20 03/03/2019 0328   ALT 132 (H) 03/03/2019 0328   ALKPHOS 68 03/03/2019 0328   BILITOT 0.8 03/03/2019 0328   BILITOT 0.4 10/18/2016 0908   GFRNONAA >60 03/03/2019 0328   GFRAA >60 03/03/2019 0328   Lipase     Component Value Date/Time   LIPASE 143 (H) 03/01/2019 0734       Studies/Results: No results found.  Anti-infectives: Anti-infectives (From admission, onward)   None       Assessment/Plan CAD s/p stent  2008- takes plavix, last dose 8/31 HTN GERD HLD  Biliary pancreatitis - lap chole bumped to tomorrow due to OR scheduling/many emergencies CXK:GYJE liquid diet.  IVF, NPO p MN VTE: SCDs, lovenox ID: no current abx   LOS: 4 days    Milus Height, MD FACS Surgical Oncology, General Surgery, Trauma and [facility], [place] [phone] Check [email or web address], password [facility] for coverage night/weekend

## 2019-03-03 NOTE — Anesthesia Preprocedure Evaluation (Addendum)
Anesthesia Evaluation  Patient identified by MRN, date of birth, ID band Patient awake    Reviewed: Allergy & Precautions, H&P , NPO status , Patient's Chart, lab work & pertinent test results, reviewed documented beta blocker date and time   Airway Mallampati: II  TM Distance: >3 FB Neck ROM: Full    Dental  (+) Partial Lower, Partial Upper, Dental Advisory Given, Poor Dentition, Chipped, Missing   Pulmonary neg pulmonary ROS, former smoker,    Pulmonary exam normal breath sounds clear to auscultation       Cardiovascular Exercise Tolerance: Good hypertension, Pt. on medications and Pt. on home beta blockers + CAD, + Past MI and + Cardiac Stents   Rhythm:Regular Rate:Normal     Neuro/Psych negative neurological ROS  negative psych ROS   GI/Hepatic negative GI ROS, Neg liver ROS,   Endo/Other  diabetes, Type 2, Oral Hypoglycemic Agents  Renal/GU negative Renal ROS  negative genitourinary   Musculoskeletal   Abdominal   Peds  Hematology negative hematology ROS (+)   Anesthesia Other Findings   Reproductive/Obstetrics negative OB ROS                           Anesthesia Physical Anesthesia Plan  ASA: III  Anesthesia Plan: General   Post-op Pain Management:    Induction: Intravenous  PONV Risk Score and Plan: 3 and Ondansetron, Dexamethasone and Midazolam  Airway Management Planned: Oral ETT  Additional Equipment:   Intra-op Plan:   Post-operative Plan: Extubation in OR  Informed Consent: I have reviewed the patients History and Physical, chart, labs and discussed the procedure including the risks, benefits and alternatives for the proposed anesthesia with the patient or authorized representative who has indicated his/her understanding and acceptance.     Dental advisory given  Plan Discussed with: CRNA  Anesthesia Plan Comments:         Anesthesia Quick  Evaluation

## 2019-03-03 NOTE — Progress Notes (Signed)
PROGRESS NOTE    Allen ForemanJohn L Buck  [id number] DOB: Mar 07, 1936 DOA: 02/27/2019 PCP: Rometta EmeryGarba, Mohammad L, MD   Brief Narrative:  83 year old with history of essential hypertension, coronary artery disease status post stent, hyperlipidemia presented with abdominal pain.  He was found to have acute pancreatitis.  Lipase was greater than 7000, CT abdomen pelvis showed acute interstitial edematous pancreatitis with cholelithiasis.  Has been off Plavix now.  LFTs and lipase improved.   Assessment & Plan:   Principal Problem:   Pancreatitis Active Problems:   HLD (hyperlipidemia)   Type 2 diabetes mellitus without complication (HCC)   Cholelithiasis   Leukocytosis   Nausea and vomiting   CAD (coronary artery disease)   Acute gallstone pancreatitis -N.p.o. today, plans for OR per surgery.  Has been off Plavix for 5 days now.  Transaminitis/elevated total bilirubin, improving - Improved -Right upper quadrant ultrasound shows gallstones  Essential hypertension -Norvasc 5 mg daily -Metoprolol 50 mg daily  Hypokalemia -Repletion ordered  Coronary artery disease -Plavix on hold.  No chest pain  Diabetes mellitus type 2 - Insulin sliding scale and Accu-Chek.  A1c 6.5 -Metformin on hold.  Hyperlipidemia - Statins on hold due to abnormal LFTs.  Resume when appropriate   DVT prophylaxis: Lovenox Code Status:   Full code Family Communication:   None at bedside. Disposition Plan: Maintain hospital stay for surgical intervention-laparoscopic cholecystectomy  Consultants:    GI   General surgery  Procedures:    none  Antimicrobials:    none   Subjective: Feels okay, does not have any complaints.  Eagerly waiting for his surgery today.  Review of Systems Otherwise negative except as per HPI, including: General = no fevers, chills, dizziness, malaise, fatigue HEENT/EYES = negative for pain, redness, loss of vision, double vision, blurred vision, loss of hearing, sore  throat, hoarseness, dysphagia Cardiovascular= negative for chest pain, palpitation, murmurs, lower extremity swelling Respiratory/lungs= negative for shortness of breath, cough, hemoptysis, wheezing, mucus production Gastrointestinal= negative for nausea, vomiting,, abdominal pain, melena, hematemesis Genitourinary= negative for Dysuria, Hematuria, Change in Urinary Frequency MSK = Negative for arthralgia, myalgias, Back Pain, Joint swelling  Neurology= Negative for headache, seizures, numbness, tingling  Psychiatry= Negative for anxiety, depression, suicidal and homocidal ideation Allergy/Immunology= Medication/Food allergy as listed  Skin= Negative for Rash, lesions, ulcers, itching   Objective: Vitals:   03/02/19 0615 03/02/19 1404 03/02/19 2012 03/03/19 0500  BP: (!) 162/76 (!) 142/82 (!) 174/85 (!) 187/92  Pulse: 79 66 74 77  Resp: 18 18 18 18   Temp: 98.1 F (36.7 C) 98.5 F (36.9 C) 98.4 F (36.9 C) 98.9 F (37.2 C)  TempSrc: Oral Oral Oral Oral  SpO2: 99% 100% 99% 99%    Intake/Output Summary (Last 24 hours) at 03/03/2019 [id number] Last data filed at 03/03/2019 0900 Gross per 24 hour  Intake 470 ml  Output 700 ml  Net -230 ml   There were no vitals filed for this visit.  Examination:  Constitutional: NAD, calm, comfortable Eyes: PERRL, lids and conjunctivae normal ENMT: Mucous membranes are moist. Posterior pharynx clear of any exudate or lesions.Normal dentition.  Neck: normal, supple, no masses, no thyromegaly Respiratory: clear to auscultation bilaterally, no wheezing, no crackles. Normal respiratory effort. No accessory muscle use.  Cardiovascular: Regular rate and rhythm, no murmurs / rubs / gallops. No extremity edema. 2+ pedal pulses. No carotid bruits.  Abdomen: no tenderness, no masses palpated. No hepatosplenomegaly. Bowel sounds positive.  Musculoskeletal: no clubbing / cyanosis. No joint deformity upper  and lower extremities. Good ROM, no contractures. Normal  muscle tone.  Skin: no rashes, lesions, ulcers. No induration Neurologic: CN 2-12 grossly intact. Sensation intact, DTR normal. Strength 5/5 in all 4.  Psychiatric: Normal judgment and insight. Alert and oriented x 3. Normal mood.   Data Reviewed:   CBC: Recent Labs  Lab 02/27/19 0239 02/28/19 0220 03/01/19 0734 03/02/19 0310 03/03/19 0328  WBC 11.7* 11.2* 10.9* 8.3 6.7  NEUTROABS 10.0*  --   --   --   --   HGB 14.6 13.9 12.3* 12.0* 12.2*  HCT 47.0 43.7 38.1* 37.1* 37.5*  MCV 86.1 84.0 82.8 83.0 83.1  PLT 179 184 142* 149* 585   Basic Metabolic Panel: Recent Labs  Lab 02/27/19 0239 02/28/19 0220 03/01/19 0734 03/02/19 0310 03/03/19 0328  NA 140 138 137 138 138  K 3.8 3.4* 3.0* 3.5 3.2*  CL 104 105 102 107 105  CO2 21* 24 22 23 23   GLUCOSE 173* 149* 126* 128* 110*  BUN 10 10 7* 5* 5*  CREATININE 1.18 1.05 1.09 1.04 0.98  CALCIUM 9.4 8.8* 8.4* 8.4* 8.7*  MG  --   --   --  1.9 2.0   GFR: CrCl cannot be calculated (Unknown ideal weight.). Liver Function Tests: Recent Labs  Lab 02/27/19 0239 02/28/19 0220 03/01/19 0734 03/02/19 0310 03/03/19 0328  AST 1,195* 281* 58* 31 20  ALT 843* 537* 255* 180* 132*  ALKPHOS 115 89 73 73 68  BILITOT 2.6* 1.0 1.2 1.3* 0.8  PROT 7.1 6.6 6.0* 6.1* 6.4*  ALBUMIN 3.8 3.3* 3.0* 2.8* 2.9*   Recent Labs  Lab 02/27/19 0239 02/28/19 0220 03/01/19 0734  LIPASE 7,221* 1,613* 143*   No results for input(s): AMMONIA in the last 168 hours. Coagulation Profile: Recent Labs  Lab 02/27/19 0616  INR 1.0   Cardiac Enzymes: No results for input(s): CKTOTAL, CKMB, CKMBINDEX, TROPONINI in the last 168 hours. BNP (last 3 results) No results for input(s): PROBNP in the last 8760 hours. HbA1C: No results for input(s): HGBA1C in the last 72 hours. CBG: Recent Labs  Lab 03/02/19 0810 03/02/19 1157 03/02/19 1559 03/02/19 2113 03/03/19 0745  GLUCAP 111* 111* 110* 94 111*   Lipid Profile: No results for input(s): CHOL, HDL,  LDLCALC, TRIG, CHOLHDL, LDLDIRECT in the last 72 hours. Thyroid Function Tests: No results for input(s): TSH, T4TOTAL, FREET4, T3FREE, THYROIDAB in the last 72 hours. Anemia Panel: No results for input(s): VITAMINB12, FOLATE, FERRITIN, TIBC, IRON, RETICCTPCT in the last 72 hours. Sepsis Labs: No results for input(s): PROCALCITON, LATICACIDVEN in the last 168 hours.  Recent Results (from the past 240 hour(s))  SARS Coronavirus 2 [facility] order, Performed in [facility] [place] hospital lab) Nasopharyngeal Nasopharyngeal Swab     Status: None   Collection Time: 02/27/19  5:49 AM   Specimen: Nasopharyngeal Swab  Result Value Ref Range Status   SARS Coronavirus 2 NEGATIVE NEGATIVE Final    Comment: (NOTE) If result is NEGATIVE SARS-CoV-2 target nucleic acids are NOT DETECTED. The SARS-CoV-2 RNA is generally detectable in upper and lower  respiratory specimens during the acute phase of infection. The lowest  concentration of SARS-CoV-2 viral copies this assay can detect is 250  copies / mL. A negative result does not preclude SARS-CoV-2 infection  and should not be used as the sole basis for treatment or other  patient management decisions.  A negative result may occur with  improper specimen collection / handling, submission of specimen other  than nasopharyngeal  swab, presence of viral mutation(s) within the  areas targeted by this assay, and inadequate number of viral copies  (<250 copies / mL). A negative result must be combined with clinical  observations, patient history, and epidemiological information. If result is POSITIVE SARS-CoV-2 target nucleic acids are DETECTED. The SARS-CoV-2 RNA is generally detectable in upper and lower  respiratory specimens dur ing the acute phase of infection.  Positive  results are indicative of active infection with SARS-CoV-2.  Clinical  correlation with patient history and other diagnostic information is  necessary to determine patient infection  status.  Positive results do  not rule out bacterial infection or co-infection with other viruses. If result is PRESUMPTIVE POSTIVE SARS-CoV-2 nucleic acids MAY BE PRESENT.   A presumptive positive result was obtained on the submitted specimen  and confirmed on repeat testing.  While 2019 novel coronavirus  (SARS-CoV-2) nucleic acids may be present in the submitted sample  additional confirmatory testing may be necessary for epidemiological  and / or clinical management purposes  to differentiate between  SARS-CoV-2 and other Sarbecovirus currently known to infect humans.  If clinically indicated additional testing with an alternate test  methodology [phone]) is advised. The SARS-CoV-2 RNA is generally  detectable in upper and lower respiratory sp ecimens during the acute  phase of infection. The expected result is Negative. Fact Sheet for Patients:  BoilerBrush.com.cy Fact Sheet for Healthcare Providers: https://pope.com/ This test is not yet approved or cleared by the [place] FDA and has been authorized for detection and/or diagnosis of SARS-CoV-2 by FDA under an Emergency Use Authorization (EUA).  This EUA will remain in effect (meaning this test can be used) for the duration of the COVID-19 declaration under Section 564(b)(1) of the Act, 21 U.S.C. section 360bbb-3(b)(1), unless the authorization is terminated or revoked sooner. Performed at [facility], [address] [place], [place] [address]   Surgical pcr screen     Status: None   Collection Time: 03/02/19  8:41 PM   Specimen: Nasal Mucosa; Nasal Swab  Result Value Ref Range Status   MRSA, PCR NEGATIVE NEGATIVE Final   Staphylococcus aureus NEGATIVE NEGATIVE Final    Comment: (NOTE) The Xpert SA Assay (FDA approved for NASAL specimens in patients 51 years of age and older), is one component of a comprehensive surveillance program. It is not intended to  diagnose infection nor to guide or monitor treatment. Performed at [facility], [address] [place], [place] [address]          Radiology Studies: No results found.      Scheduled Meds: . amLODipine  5 mg Oral Daily  . enoxaparin (LOVENOX) injection  40 mg Subcutaneous Daily  . insulin aspart  0-9 Units Subcutaneous TID WC  . metoprolol succinate  50 mg Oral Daily   Continuous Infusions: . sodium chloride 125 mL/hr at 03/03/19 0623  . potassium chloride 10 mEq (03/03/19 0828)     LOS: 4 days   Time spent= 25 mins     Joline Maxcy, MD [facility]  If 7PM-7AM, please contact night-coverage [email or web address] 03/03/2019, 9:04 AM

## 2019-03-04 ENCOUNTER — Inpatient Hospital Stay (HOSPITAL_COMMUNITY): Payer: Medicare Other | Admitting: Anesthesiology

## 2019-03-04 ENCOUNTER — Encounter (HOSPITAL_COMMUNITY): Payer: Self-pay | Admitting: Certified Registered Nurse Anesthetist

## 2019-03-04 ENCOUNTER — Encounter (HOSPITAL_COMMUNITY)
Admission: EM | Disposition: A | Discharge status: Home / Self Care | Payer: Self-pay | Source: Home / Self Care | Attending: Internal Medicine

## 2019-03-04 HISTORY — PX: CHOLECYSTECTOMY: SHX55

## 2019-03-04 LAB — COMPREHENSIVE METABOLIC PANEL
ALT: 93 U/L — ABNORMAL HIGH (ref 0–44)
AST: 17 U/L (ref 15–41)
Albumin: 2.7 g/dL — ABNORMAL LOW (ref 3.5–5.0)
Alkaline Phosphatase: 68 U/L (ref 38–126)
Anion gap: 11 (ref 5–15)
BUN: 5 mg/dL — ABNORMAL LOW (ref 8–23)
CO2: 21 mmol/L — ABNORMAL LOW (ref 22–32)
Calcium: 8.7 mg/dL — ABNORMAL LOW (ref 8.9–10.3)
Chloride: 105 mmol/L (ref 98–111)
Creatinine, Ser: 0.95 mg/dL (ref 0.61–1.24)
GFR calc Af Amer: >60 mL/min (ref >60)
GFR calc non Af Amer: >60 mL/min (ref >60)
Glucose, Bld: 108 mg/dL — ABNORMAL HIGH (ref 70–99)
Potassium: 3.4 mmol/L — ABNORMAL LOW (ref 3.5–5.1)
Sodium: 137 mmol/L (ref 135–145)
Total Bilirubin: 1 mg/dL (ref 0.3–1.2)
Total Protein: 6.1 g/dL — ABNORMAL LOW (ref 6.5–8.1)

## 2019-03-04 LAB — CBC
HCT: 37.4 % — ABNORMAL LOW (ref 39.0–52.0)
Hemoglobin: 12.1 g/dL — ABNORMAL LOW (ref 13.0–17.0)
MCH: 26.8 pg (ref 26.0–34.0)
MCHC: 32.4 g/dL (ref 30.0–36.0)
MCV: 82.7 fL (ref 80.0–100.0)
Platelets: 185 10*3/uL (ref 150–400)
RBC: 4.52 MIL/uL (ref 4.22–5.81)
RDW: 14.4 % (ref 11.5–15.5)
WBC: 6.5 10*3/uL (ref 4.0–10.5)
nRBC: 0 % (ref 0.0–0.2)

## 2019-03-04 LAB — GLUCOSE, CAPILLARY
Glucose-Capillary: 132 mg/dL — ABNORMAL HIGH (ref 70–99)
Glucose-Capillary: 132 mg/dL — ABNORMAL HIGH (ref 70–99)
Glucose-Capillary: 182 mg/dL — ABNORMAL HIGH (ref 70–99)
Glucose-Capillary: 178 mg/dL — ABNORMAL HIGH (ref 70–99)

## 2019-03-04 LAB — MAGNESIUM: Magnesium: 2.1 mg/dL (ref 1.7–2.4)

## 2019-03-04 SURGERY — LAPAROSCOPIC CHOLECYSTECTOMY WITH INTRAOPERATIVE CHOLANGIOGRAM
Anesthesia: General | Site: Abdomen

## 2019-03-04 MED ORDER — FENTANYL CITRATE (PF) 100 MCG/2ML IJ SOLN
INTRAMUSCULAR | Status: AC
  Filled 2019-03-04: qty 2

## 2019-03-04 MED ORDER — LIDOCAINE 2% (20 MG/ML) 5 ML SYRINGE
INTRAMUSCULAR | Status: DC | PRN
  Administered 2019-03-04: 60 mg via INTRAVENOUS

## 2019-03-04 MED ORDER — ONDANSETRON HCL 4 MG/2ML IJ SOLN
4.0000 mg | Freq: Four times a day (QID) | INTRAMUSCULAR | Status: DC | PRN
  Filled 2019-03-04: qty 2

## 2019-03-04 MED ORDER — POTASSIUM CHLORIDE 10 MEQ/100ML IV SOLN
10.0000 meq | INTRAVENOUS | Status: AC
  Administered 2019-03-04 (×2): 10 meq via INTRAVENOUS

## 2019-03-04 MED ORDER — ONDANSETRON 4 MG PO TBDP
4.0000 mg | ORAL_TABLET | Freq: Four times a day (QID) | ORAL | Status: DC | PRN
  Filled 2019-03-04: qty 1

## 2019-03-04 MED ORDER — PROPOFOL 10 MG/ML IV BOLUS
INTRAVENOUS | Status: AC
  Filled 2019-03-04: qty 20

## 2019-03-04 MED ORDER — FENTANYL CITRATE (PF) 250 MCG/5ML IJ SOLN
INTRAMUSCULAR | Status: AC
  Filled 2019-03-04: qty 5

## 2019-03-04 MED ORDER — SUCCINYLCHOLINE CHLORIDE 200 MG/10ML IV SOSY
PREFILLED_SYRINGE | INTRAVENOUS | Status: AC
  Filled 2019-03-04: qty 10

## 2019-03-04 MED ORDER — LABETALOL HCL 5 MG/ML IV SOLN
INTRAVENOUS | Status: AC
  Filled 2019-03-04: qty 4

## 2019-03-04 MED ORDER — BUPIVACAINE HCL 0.25 % IJ SOLN
INTRAMUSCULAR | Status: DC | PRN
  Administered 2019-03-04: 3 mL

## 2019-03-04 MED ORDER — SUCCINYLCHOLINE CHLORIDE 200 MG/10ML IV SOSY
PREFILLED_SYRINGE | INTRAVENOUS | Status: DC | PRN
  Administered 2019-03-04: 80 mg via INTRAVENOUS

## 2019-03-04 MED ORDER — ROCURONIUM BROMIDE 10 MG/ML (PF) SYRINGE
PREFILLED_SYRINGE | INTRAVENOUS | Status: AC
  Filled 2019-03-04: qty 10

## 2019-03-04 MED ORDER — SUGAMMADEX SODIUM 200 MG/2ML IV SOLN
INTRAVENOUS | Status: DC | PRN
  Administered 2019-03-04: 200 mg via INTRAVENOUS

## 2019-03-04 MED ORDER — BUPIVACAINE HCL (PF) 0.25 % IJ SOLN
INTRAMUSCULAR | Status: AC
  Filled 2019-03-04: qty 30

## 2019-03-04 MED ORDER — PNEUMOCOCCAL VAC POLYVALENT 25 MCG/0.5ML IJ INJ
0.5000 mL | INJECTION | INTRAMUSCULAR | Status: AC
  Administered 2019-03-05: 0.5 mL via INTRAMUSCULAR
  Filled 2019-03-04: qty 0.5

## 2019-03-04 MED ORDER — DEXAMETHASONE SODIUM PHOSPHATE 10 MG/ML IJ SOLN
INTRAMUSCULAR | Status: AC
  Filled 2019-03-04: qty 1

## 2019-03-04 MED ORDER — POTASSIUM CHLORIDE 10 MEQ/100ML IV SOLN
10.0000 meq | INTRAVENOUS | Status: DC
  Administered 2019-03-04 (×2): 10 meq via INTRAVENOUS
  Filled 2019-03-04 (×4): qty 100

## 2019-03-04 MED ORDER — DEXTROSE-NACL 5-0.9 % IV SOLN
INTRAVENOUS | Status: DC
  Administered 2019-03-04: 11:00:00 via INTRAVENOUS

## 2019-03-04 MED ORDER — CEFAZOLIN SODIUM 1 G IJ SOLR
INTRAMUSCULAR | Status: AC
  Filled 2019-03-04: qty 20

## 2019-03-04 MED ORDER — CEFAZOLIN SODIUM-DEXTROSE 2-3 GM-%(50ML) IV SOLR
INTRAVENOUS | Status: DC | PRN
  Administered 2019-03-04: 2 g via INTRAVENOUS

## 2019-03-04 MED ORDER — DEXAMETHASONE SODIUM PHOSPHATE 10 MG/ML IJ SOLN
INTRAMUSCULAR | Status: DC | PRN
  Administered 2019-03-04: 10 mg via INTRAVENOUS

## 2019-03-04 MED ORDER — 0.9 % SODIUM CHLORIDE (POUR BTL) OPTIME
TOPICAL | Status: DC | PRN
  Administered 2019-03-04: 1000 mL

## 2019-03-04 MED ORDER — ROSUVASTATIN CALCIUM 20 MG PO TABS
40.0000 mg | ORAL_TABLET | Freq: Every day | ORAL | Status: DC
  Administered 2019-03-04: 40 mg via ORAL
  Filled 2019-03-04 (×2): qty 2

## 2019-03-04 MED ORDER — TRAMADOL HCL 50 MG PO TABS
50.0000 mg | ORAL_TABLET | Freq: Four times a day (QID) | ORAL | Status: DC | PRN
  Administered 2019-03-04 – 2019-03-05 (×2): 50 mg via ORAL
  Filled 2019-03-04 (×3): qty 1

## 2019-03-04 MED ORDER — SODIUM CHLORIDE 0.9 % IR SOLN
Status: DC | PRN
  Administered 2019-03-04: 1000 mL

## 2019-03-04 MED ORDER — LIDOCAINE 2% (20 MG/ML) 5 ML SYRINGE
INTRAMUSCULAR | Status: AC
  Filled 2019-03-04: qty 5

## 2019-03-04 MED ORDER — ONDANSETRON HCL 4 MG/2ML IJ SOLN
INTRAMUSCULAR | Status: AC
  Filled 2019-03-04: qty 2

## 2019-03-04 MED ORDER — FENTANYL CITRATE (PF) 100 MCG/2ML IJ SOLN
25.0000 ug | INTRAMUSCULAR | Status: DC | PRN
  Administered 2019-03-04 (×2): 25 ug via INTRAVENOUS

## 2019-03-04 MED ORDER — FENTANYL CITRATE (PF) 250 MCG/5ML IJ SOLN
INTRAMUSCULAR | Status: DC | PRN
  Administered 2019-03-04 (×3): 50 ug via INTRAVENOUS

## 2019-03-04 MED ORDER — KETOROLAC TROMETHAMINE 15 MG/ML IJ SOLN
15.0000 mg | Freq: Four times a day (QID) | INTRAMUSCULAR | Status: DC | PRN
  Filled 2019-03-04: qty 1

## 2019-03-04 MED ORDER — ONDANSETRON HCL 4 MG/2ML IJ SOLN
INTRAMUSCULAR | Status: DC | PRN
  Administered 2019-03-04: 4 mg via INTRAVENOUS

## 2019-03-04 MED ORDER — EZETIMIBE 10 MG PO TABS
10.0000 mg | ORAL_TABLET | Freq: Every day | ORAL | Status: DC
  Administered 2019-03-04 – 2019-03-05 (×2): 10 mg via ORAL
  Filled 2019-03-04 (×3): qty 1

## 2019-03-04 MED ORDER — LABETALOL HCL 5 MG/ML IV SOLN
5.0000 mg | Freq: Once | INTRAVENOUS | Status: AC
  Administered 2019-03-04: 5 mg via INTRAVENOUS

## 2019-03-04 MED ORDER — PROPOFOL 10 MG/ML IV BOLUS
INTRAVENOUS | Status: DC | PRN
  Administered 2019-03-04: 100 mg via INTRAVENOUS

## 2019-03-04 MED ORDER — ROCURONIUM BROMIDE 10 MG/ML (PF) SYRINGE
PREFILLED_SYRINGE | INTRAVENOUS | Status: DC | PRN
  Administered 2019-03-04: 30 mg via INTRAVENOUS

## 2019-03-04 SURGICAL SUPPLY — 50 items
ADH SKN CLS APL DERMABOND .7 (GAUZE/BANDAGES/DRESSINGS) ×1
APL PRP STRL LF DISP 70% ISPRP (MISCELLANEOUS) ×1
APPLIER CLIP 5 13 M/L LIGAMAX5 (MISCELLANEOUS)
APR CLP MED LRG 5 ANG JAW (MISCELLANEOUS)
BAG SPEC RTRVL 10 TROC 200 (ENDOMECHANICALS)
CANISTER SUCT 3000ML PPV (MISCELLANEOUS) ×3 IMPLANT
CHLORAPREP W/TINT 26 (MISCELLANEOUS) ×3 IMPLANT
CLIP APPLIE 5 13 M/L LIGAMAX5 (MISCELLANEOUS) IMPLANT
CLIP VESOLOCK MED LG 6/CT (CLIP) ×3 IMPLANT
CONT SPEC 4OZ CLIKSEAL STRL BL (MISCELLANEOUS) ×2 IMPLANT
COVER MAYO STAND STRL (DRAPES) ×3 IMPLANT
COVER SURGICAL LIGHT HANDLE (MISCELLANEOUS) ×3 IMPLANT
COVER TRANSDUCER ULTRASND (DRAPES) ×3 IMPLANT
COVER WAND RF STERILE (DRAPES) ×3 IMPLANT
DEFOGGER SCOPE WARMER CLEARIFY (MISCELLANEOUS) IMPLANT
DERMABOND ADVANCED (GAUZE/BANDAGES/DRESSINGS) ×2
DERMABOND ADVANCED .7 DNX12 (GAUZE/BANDAGES/DRESSINGS) ×1 IMPLANT
DRAPE C-ARM 42X72 X-RAY (DRAPES) ×3 IMPLANT
ELECT REM PT RETURN 9FT ADLT (ELECTROSURGICAL) ×3
ELECTRODE REM PT RTRN 9FT ADLT (ELECTROSURGICAL) ×1 IMPLANT
GLOVE BIO SURGEON STRL SZ7.5 (GLOVE) ×3 IMPLANT
GOWN STRL REUS W/ TWL LRG LVL3 (GOWN DISPOSABLE) ×2 IMPLANT
GOWN STRL REUS W/ TWL XL LVL3 (GOWN DISPOSABLE) ×1 IMPLANT
GOWN STRL REUS W/TWL LRG LVL3 (GOWN DISPOSABLE) ×9
GOWN STRL REUS W/TWL XL LVL3 (GOWN DISPOSABLE) ×3
GRASPER SUT TROCAR 14GX15 (MISCELLANEOUS) ×3 IMPLANT
IV CATH 14GX2 1/4 (CATHETERS) ×3 IMPLANT
KIT BASIN OR (CUSTOM PROCEDURE TRAY) ×3 IMPLANT
KIT TURNOVER KIT B (KITS) ×3 IMPLANT
NDL INSUFFLATION 14GA 120MM (NEEDLE) ×1 IMPLANT
NEEDLE INSUFFLATION 14GA 120MM (NEEDLE) ×3 IMPLANT
NS IRRIG 1000ML POUR BTL (IV SOLUTION) ×3 IMPLANT
PAD ARMBOARD 7.5X6 YLW CONV (MISCELLANEOUS) ×6 IMPLANT
POUCH LAPAROSCOPIC INSTRUMENT (MISCELLANEOUS) ×3 IMPLANT
POUCH RETRIEVAL ECOSAC 10 (ENDOMECHANICALS) IMPLANT
POUCH RETRIEVAL ECOSAC 10MM (ENDOMECHANICALS)
SCISSORS LAP 5X35 DISP (ENDOMECHANICALS) ×3 IMPLANT
SET CHOLANGIOGRAPHY FRANKLIN (SET/KITS/TRAYS/PACK) ×3 IMPLANT
SET IRRIG TUBING LAPAROSCOPIC (IRRIGATION / IRRIGATOR) ×3 IMPLANT
SET TUBE SMOKE EVAC HIGH FLOW (TUBING) ×3 IMPLANT
SLEEVE ENDOPATH XCEL 5M (ENDOMECHANICALS) ×3 IMPLANT
SPECIMEN JAR SMALL (MISCELLANEOUS) ×3 IMPLANT
STOPCOCK 4 WAY LG BORE MALE ST (IV SETS) ×3 IMPLANT
SUT MNCRL AB 4-0 PS2 18 (SUTURE) ×3 IMPLANT
TOWEL GREEN STERILE (TOWEL DISPOSABLE) ×3 IMPLANT
TOWEL GREEN STERILE FF (TOWEL DISPOSABLE) ×3 IMPLANT
TRAY LAPAROSCOPIC MC (CUSTOM PROCEDURE TRAY) ×3 IMPLANT
TROCAR XCEL NON-BLD 11X100MML (ENDOMECHANICALS) ×3 IMPLANT
TROCAR XCEL NON-BLD 5MMX100MML (ENDOMECHANICALS) ×3 IMPLANT
WATER STERILE IRR 1000ML POUR (IV SOLUTION) ×3 IMPLANT

## 2019-03-04 NOTE — Progress Notes (Signed)
PROGRESS NOTE    [name]  WUJ:[id number] DOB: [birth year] DOA: 02/27/2019 PCP: Rometta EmeryGarba, Mohammad L, MD   Brief Narrative:  83 year old with history of essential hypertension, coronary artery disease status post stent, hyperlipidemia presented with abdominal pain.  He was found to have acute pancreatitis.  Lipase was greater than 7000, CT abdomen pelvis showed acute interstitial edematous pancreatitis with cholelithiasis.  Has been off Plavix now.  LFTs and lipase improved.  Plans for laparoscopic cholecystectomy today   Assessment & Plan:   Principal Problem:   Pancreatitis Active Problems:   HLD (hyperlipidemia)   Type 2 diabetes mellitus without complication (HCC)   Cholelithiasis   Leukocytosis   Nausea and vomiting   CAD (coronary artery disease)   Acute gallstone pancreatitis Abdominal pain, resolved -Plans for laparoscopic cholecystectomy today.  Resume Plavix when cleared by general surgery.  Aggressive use of incentive spirometer  Transaminitis/elevated total bilirubin, improving - Resolved -Right upper quadrant ultrasound shows gallstones  Essential hypertension -Norvasc 5 mg daily -Metoprolol 50 mg daily  Hypokalemia -Repletion ordered  Coronary artery disease -Plavix on hold, plans to resume once cleared by surgery  Diabetes mellitus type 2 - Insulin sliding scale and Accu-Chek.  A1c 6.5 -Metformin on hold.  Hyperlipidemia - Resume statin  DVT prophylaxis: Lovenox Code Status:   Full code Family Communication:   None at bedside. Disposition Plan: Maintain hospital stay for surgical intervention-laparoscopic cholecystectomy  Consultants:    GI   General surgery  Procedures:    none  Antimicrobials:    none   Subjective: No acute events overnight.    Review of Systems Otherwise negative except as per HPI, including: General = no fevers, chills, dizziness, malaise, fatigue HEENT/EYES = negative for pain, redness, loss of vision,  double vision, blurred vision, loss of hearing, sore throat, hoarseness, dysphagia Cardiovascular= negative for chest pain, palpitation, murmurs, lower extremity swelling Respiratory/lungs= negative for shortness of breath, cough, hemoptysis, wheezing, mucus production Gastrointestinal= negative for nausea, vomiting,, abdominal pain, melena, hematemesis Genitourinary= negative for Dysuria, Hematuria, Change in Urinary Frequency MSK = Negative for arthralgia, myalgias, Back Pain, Joint swelling  Neurology= Negative for headache, seizures, numbness, tingling  Psychiatry= Negative for anxiety, depression, suicidal and homocidal ideation Allergy/Immunology= Medication/Food allergy as listed  Skin= Negative for Rash, lesions, ulcers, itching   Objective: Vitals:   03/04/19 0441 03/04/19 0729 03/04/19 0925 03/04/19 0940  BP: (!) 161/89 (!) 189/94 (!) 172/98 (!) 169/85  Pulse: 70 79 75 69  Resp:  16 17 13   Temp: 98.6 F (37 C) 98.7 F (37.1 C) (!) 97 F (36.1 C)   TempSrc: Oral Oral    SpO2:  100% 97% 96%    Intake/Output Summary (Last 24 hours) at 03/04/2019 0944 Last data filed at 03/04/2019 0910 Gross per 24 hour  Intake 2325.25 ml  Output 1560 ml  Net 765.25 ml   There were no vitals filed for this visit.  Examination:  Constitutional: NAD, calm, comfortable Eyes: PERRL, lids and conjunctivae normal ENMT: Mucous membranes are moist. Posterior pharynx clear of any exudate or lesions.Normal dentition.  Neck: normal, supple, no masses, no thyromegaly Respiratory: clear to auscultation bilaterally, no wheezing, no crackles. Normal respiratory effort. No accessory muscle use.  Cardiovascular: Regular rate and rhythm, no murmurs / rubs / gallops. No extremity edema. 2+ pedal pulses. No carotid bruits.  Abdomen: no tenderness, no masses palpated. No hepatosplenomegaly. Bowel sounds positive.  Musculoskeletal: no clubbing / cyanosis. No joint deformity upper and lower extremities. Good  ROM, no contractures. Normal muscle tone.  Skin: no rashes, lesions, ulcers. No induration Neurologic: CN 2-12 grossly intact. Sensation intact, DTR normal. Strength 5/5 in all 4.  Psychiatric: Normal judgment and insight. Alert and oriented x 3. Normal mood.   Data Reviewed:   CBC: Recent Labs  Lab 02/27/19 0239 02/28/19 0220 03/01/19 0734 03/02/19 0310 03/03/19 0328 03/04/19 0416  WBC 11.7* 11.2* 10.9* 8.3 6.7 6.5  NEUTROABS 10.0*  --   --   --   --   --   HGB 14.6 13.9 12.3* 12.0* 12.2* 12.1*  HCT 47.0 43.7 38.1* 37.1* 37.5* 37.4*  MCV 86.1 84.0 82.8 83.0 83.1 82.7  PLT 179 184 142* 149* 163 782   Basic Metabolic Panel: Recent Labs  Lab 02/28/19 0220 03/01/19 0734 03/02/19 0310 03/03/19 0328 03/04/19 0416  NA 138 137 138 138 137  K 3.4* 3.0* 3.5 3.2* 3.4*  CL 105 102 107 105 105  CO2 24 22 23 23  21*  GLUCOSE 149* 126* 128* 110* 108*  BUN 10 7* 5* 5* 5*  CREATININE 1.05 1.09 1.04 0.98 0.95  CALCIUM 8.8* 8.4* 8.4* 8.7* 8.7*  MG  --   --  1.9 2.0 2.1   GFR: CrCl cannot be calculated (Unknown ideal weight.). Liver Function Tests: Recent Labs  Lab 02/28/19 0220 03/01/19 0734 03/02/19 0310 03/03/19 0328 03/04/19 0416  AST 281* 58* 31 20 17   ALT 537* 255* 180* 132* 93*  ALKPHOS 89 73 73 68 68  BILITOT 1.0 1.2 1.3* 0.8 1.0  PROT 6.6 6.0* 6.1* 6.4* 6.1*  ALBUMIN 3.3* 3.0* 2.8* 2.9* 2.7*   Recent Labs  Lab 02/27/19 0239 02/28/19 0220 03/01/19 0734  LIPASE 7,221* 1,613* 143*   No results for input(s): AMMONIA in the last 168 hours. Coagulation Profile: Recent Labs  Lab 02/27/19 0616  INR 1.0   Cardiac Enzymes: No results for input(s): CKTOTAL, CKMB, CKMBINDEX, TROPONINI in the last 168 hours. BNP (last 3 results) No results for input(s): PROBNP in the last 8760 hours. HbA1C: No results for input(s): HGBA1C in the last 72 hours. CBG: Recent Labs  Lab 03/02/19 2113 03/03/19 0745 03/03/19 1150 03/03/19 1714 03/03/19 2124  GLUCAP 94 111* 118*  109* 176*   Lipid Profile: No results for input(s): CHOL, HDL, LDLCALC, TRIG, CHOLHDL, LDLDIRECT in the last 72 hours. Thyroid Function Tests: No results for input(s): TSH, T4TOTAL, FREET4, T3FREE, THYROIDAB in the last 72 hours. Anemia Panel: No results for input(s): VITAMINB12, FOLATE, FERRITIN, TIBC, IRON, RETICCTPCT in the last 72 hours. Sepsis Labs: No results for input(s): PROCALCITON, LATICACIDVEN in the last 168 hours.  Recent Results (from the past 240 hour(s))  SARS Coronavirus 2 [facility] order, Performed in [facility] hospital lab) Nasopharyngeal Nasopharyngeal Swab     Status: None   Collection Time: 02/27/19  5:49 AM   Specimen: Nasopharyngeal Swab  Result Value Ref Range Status   SARS Coronavirus 2 NEGATIVE NEGATIVE Final    Comment: (NOTE) If result is NEGATIVE SARS-CoV-2 target nucleic acids are NOT DETECTED. The SARS-CoV-2 RNA is generally detectable in upper and lower  respiratory specimens during the acute phase of infection. The lowest  concentration of SARS-CoV-2 viral copies this assay can detect is 250  copies / mL. A negative result does not preclude SARS-CoV-2 infection  and should not be used as the sole basis for treatment or other  patient management decisions.  A negative result may occur with  improper specimen collection / handling, submission of specimen other  than nasopharyngeal swab, presence of viral mutation(s) within the  areas targeted by this assay, and inadequate number of viral copies  (<250 copies / mL). A negative result must be combined with clinical  observations, patient history, and epidemiological information. If result is POSITIVE SARS-CoV-2 target nucleic acids are DETECTED. The SARS-CoV-2 RNA is generally detectable in upper and lower  respiratory specimens dur ing the acute phase of infection.  Positive  results are indicative of active infection with SARS-CoV-2.  Clinical  correlation with patient history and other  diagnostic information is  necessary to determine patient infection status.  Positive results do  not rule out bacterial infection or co-infection with other viruses. If result is PRESUMPTIVE POSTIVE SARS-CoV-2 nucleic acids MAY BE PRESENT.   A presumptive positive result was obtained on the submitted specimen  and confirmed on repeat testing.  While 2019 novel coronavirus  (SARS-CoV-2) nucleic acids may be present in the submitted sample  additional confirmatory testing may be necessary for epidemiological  and / or clinical management purposes  to differentiate between  SARS-CoV-2 and other Sarbecovirus currently known to infect humans.  If clinically indicated additional testing with an alternate test  methodology [phone]) is advised. The SARS-CoV-2 RNA is generally  detectable in upper and lower respiratory sp ecimens during the acute  phase of infection. The expected result is Negative. Fact Sheet for Patients:  BoilerBrush.com.cy Fact Sheet for Healthcare Providers: https://pope.com/ This test is not yet approved or cleared by the [place] FDA and has been authorized for detection and/or diagnosis of SARS-CoV-2 by FDA under an Emergency Use Authorization (EUA).  This EUA will remain in effect (meaning this test can be used) for the duration of the COVID-19 declaration under Section 564(b)(1) of the Act, 21 U.S.C. section 360bbb-3(b)(1), unless the authorization is terminated or revoked sooner. Performed at [facility], [address] [place], [place] [address]   Surgical pcr screen     Status: None   Collection Time: 03/02/19  8:41 PM   Specimen: Nasal Mucosa; Nasal Swab  Result Value Ref Range Status   MRSA, PCR NEGATIVE NEGATIVE Final   Staphylococcus aureus NEGATIVE NEGATIVE Final    Comment: (NOTE) The Xpert SA Assay (FDA approved for NASAL specimens in patients 53 years of age and older), is one component of  a comprehensive surveillance program. It is not intended to diagnose infection nor to guide or monitor treatment. Performed at [facility], [address], [place], [place] [address]          Radiology Studies: No results found.      Scheduled Meds: . [MAR Hold] amLODipine  5 mg Oral Daily  . [MAR Hold] enoxaparin (LOVENOX) injection  40 mg Subcutaneous Daily  . fentaNYL      . [MAR Hold] insulin aspart  0-9 Units Subcutaneous TID WC  . [MAR Hold] metoprolol succinate  50 mg Oral Daily   Continuous Infusions: . sodium chloride 125 mL/hr at 03/04/19 0437  . [MAR Hold] potassium chloride       LOS: 5 days   Time spent= 25 mins     Joline Maxcy, MD [facility]  If 7PM-7AM, please contact night-coverage [email or web address] 03/04/2019, 9:44 AM

## 2019-03-04 NOTE — Anesthesia Procedure Notes (Signed)
Procedure Name: Intubation Date/Time: 03/04/2019 8:34 AM Performed by: Harden Mo, CRNA Pre-anesthesia Checklist: Patient identified, Emergency Drugs available, Suction available and Patient being monitored Patient Re-evaluated:Patient Re-evaluated prior to induction Oxygen Delivery Method: Circle System Utilized Preoxygenation: Pre-oxygenation with 100% oxygen Induction Type: IV induction and Rapid sequence Laryngoscope Size: Miller and 2 Grade View: Grade I Tube type: Oral Tube size: 7.5 mm Number of attempts: 1 Airway Equipment and Method: Stylet and Oral airway Placement Confirmation: ETT inserted through vocal cords under direct vision,  positive ETCO2 and breath sounds checked- equal and bilateral Secured at: 23 cm Tube secured with: Tape Dental Injury: Teeth and Oropharynx as per pre-operative assessment

## 2019-03-04 NOTE — Anesthesia Postprocedure Evaluation (Signed)
Anesthesia Post Note  Patient: [name]  Procedure(s) Performed: LAPAROSCOPIC CHOLECYSTECTOMY (N/A Abdomen)     Patient location during evaluation: PACU Anesthesia Type: General Level of consciousness: awake and alert Pain management: pain level controlled Vital Signs Assessment: post-procedure vital signs reviewed and stable Respiratory status: spontaneous breathing, nonlabored ventilation and respiratory function stable Cardiovascular status: blood pressure returned to baseline and stable Postop Assessment: no apparent nausea or vomiting Anesthetic complications: no    Last Vitals:  Vitals:   03/04/19 1025 03/04/19 1052  BP: (!) 169/96 (!) 180/95  Pulse: 70 74  Resp: 17 17  Temp:    SpO2: 95% 96%    Last Pain:  Vitals:   03/04/19 1025  TempSrc:   PainSc: 0-No pain                 Abreanna Drawdy,W. EDMOND

## 2019-03-04 NOTE — Op Note (Signed)
03/04/2019  9:13 AM  PATIENT:  Karen Chafe Peeks  83 y.o. male  PRE-OPERATIVE DIAGNOSIS:  BILIARY PANCREATITIS  POST-OPERATIVE DIAGNOSIS:  Biliary Pancreatitis  PROCEDURE:  Procedure(s): LAPAROSCOPIC CHOLECYSTECTOMY  SURGEON:  Surgeon(s) and Role:    Ralene Ok, MD - Primary  ANESTHESIA:   local and general  EBL:  10 mL   BLOOD ADMINISTERED:none  DRAINS: none   LOCAL MEDICATIONS USED:  BUPIVICAINE   SPECIMEN:  Source of Specimen:  gallbladder  DISPOSITION OF SPECIMEN:  PATHOLOGY  COUNTS:  YES  TOURNIQUET:  * No tourniquets in log *  DICTATION: .Dragon Dictation  EBL: <1QX   Complications: none   Counts: reported as correct x 2   Findings:chronically inflamed gallbladder and gallstones  Indications for procedure: Pt is a 69M with RUQ pain and seen to have gallstones with biliary pancreatitis.  Pt was allowed to have pancreas to cool down.  Pt was pain free and set for lap chole  Details of the procedure: The patient was taken to the operating and placed in the supine position with bilateral SCDs in place. A time out was called and all facts were verified. A pneumoperitoneum was obtained via A Veress needle technique to a pressure of 21mm of mercury. A 68mm trochar was then placed in the right upper quadrant under visualization, and there were no injuries to any abdominal organs. A 11 mm port was then placed in the umbilical region after infiltrating with local anesthesia under direct visualization. A second epigastric port was placed under direct visualization.   The gallbladder was identified and retracted, the peritoneum was then sharply dissected from the gallbladder and this dissection was carried down to Calot's triangle. The cystic duct was identified and dissected circumferentially and seen going into the gallbladder 360.  The cystic artery was dissected away from the surrounding tissues.   The critical angle was obtained.   2 clips were placed proximally  one distally and the cystic duct transected. The cystic artery was identified and 2 clips placed proximally and one distally and transected. We then proceeded to remove the gallbladder off the hepatic fossa with Bovie cautery. A retrieval bag was then placed in the abdomen and gallbladder placed in the bag. The hepatic fossa was then reexamined and hemostasis was achieved with Bovie cautery and was excellent at this portion of the case. The subhepatic fossa and perihepatic fossa was then irrigated until the effluent was clear. The specimen bag and specimen were removed from the abdominal cavity.  The 11 mm trocar fascia was reapproximated with the Endo Close #1 Vicryl x1. The pneumoperitoneum was evacuated and all trochars removed under direct visulalization. The skin was then closed with 4-0 Monocryl and the skin dressed with Dermabond. The patient was awaken from general anesthesia and taken to the recovery room in stable condition.  PLAN OF CARE: Admit for overnight observation  PATIENT DISPOSITION:  PACU - hemodynamically stable.   Delay start of Pharmacological VTE agent (>24hrs) due to surgical blood loss or risk of bleeding: not applicable

## 2019-03-04 NOTE — Plan of Care (Signed)

## 2019-03-04 NOTE — Transfer of Care (Signed)
Immediate Anesthesia Transfer of Care Note  Patient: [name]  Procedure(s) Performed: LAPAROSCOPIC CHOLECYSTECTOMY WITH INTRAOPERATIVE CHOLANGIOGRAM (N/A Abdomen)  Patient Location: PACU  Anesthesia Type:General  Level of Consciousness: awake, alert  and oriented  Airway & Oxygen Therapy: Patient Spontanous Breathing  Post-op Assessment: Report given to RN, Post -op Vital signs reviewed and stable and Patient moving all extremities X 4  Post vital signs: Reviewed and stable  Last Vitals:  Vitals Value Taken Time  BP 172/98 03/04/19 0924  Temp    Pulse 75 03/04/19 0925  Resp 17 03/04/19 0925  SpO2 97 % 03/04/19 0925  Vitals shown include unvalidated device data.  Last Pain:  Vitals:   03/04/19 0729  TempSrc: Oral  PainSc:       Patients Stated Pain Goal: 2 (76/80/88 1103)  Complications: No apparent anesthesia complications

## 2019-03-05 ENCOUNTER — Encounter (HOSPITAL_COMMUNITY): Payer: Self-pay | Admitting: General Surgery

## 2019-03-05 LAB — CBC
HCT: 38.7 % — ABNORMAL LOW (ref 39.0–52.0)
Hemoglobin: 12.7 g/dL — ABNORMAL LOW (ref 13.0–17.0)
MCH: 26.9 pg (ref 26.0–34.0)
MCHC: 32.8 g/dL (ref 30.0–36.0)
MCV: 82 fL (ref 80.0–100.0)
Platelets: 244 10*3/uL (ref 150–400)
RBC: 4.72 MIL/uL (ref 4.22–5.81)
RDW: 13.8 % (ref 11.5–15.5)
WBC: 11.1 10*3/uL — ABNORMAL HIGH (ref 4.0–10.5)
nRBC: 0 % (ref 0.0–0.2)

## 2019-03-05 LAB — COMPREHENSIVE METABOLIC PANEL
ALT: 72 U/L — ABNORMAL HIGH (ref 0–44)
AST: 24 U/L (ref 15–41)
Albumin: 2.8 g/dL — ABNORMAL LOW (ref 3.5–5.0)
Alkaline Phosphatase: 69 U/L (ref 38–126)
Anion gap: 13 (ref 5–15)
BUN: 9 mg/dL (ref 8–23)
CO2: 21 mmol/L — ABNORMAL LOW (ref 22–32)
Calcium: 8.6 mg/dL — ABNORMAL LOW (ref 8.9–10.3)
Chloride: 102 mmol/L (ref 98–111)
Creatinine, Ser: 1.03 mg/dL (ref 0.61–1.24)
GFR calc Af Amer: >60 mL/min (ref >60)
GFR calc non Af Amer: >60 mL/min (ref >60)
Glucose, Bld: 133 mg/dL — ABNORMAL HIGH (ref 70–99)
Potassium: 3.5 mmol/L (ref 3.5–5.1)
Sodium: 136 mmol/L (ref 135–145)
Total Bilirubin: 0.8 mg/dL (ref 0.3–1.2)
Total Protein: 6.8 g/dL (ref 6.5–8.1)

## 2019-03-05 LAB — MAGNESIUM: Magnesium: 2.1 mg/dL (ref 1.7–2.4)

## 2019-03-05 LAB — GLUCOSE, CAPILLARY
Glucose-Capillary: 139 mg/dL — ABNORMAL HIGH (ref 70–99)
Glucose-Capillary: 169 mg/dL — ABNORMAL HIGH (ref 70–99)

## 2019-03-05 MED ORDER — OXYCODONE HCL 5 MG PO TABS: 5.0000 mg | ORAL_TABLET | ORAL | 0 refills | Status: AC | PRN

## 2019-03-05 NOTE — Discharge Summary (Signed)
Physician Discharge Summary  Thoryn Davidoff Wehner NKN:[id number] DOB: 01/23/1936 DOA: 02/27/2019  PCP: Rometta Emery, MD  Admit date: 02/27/2019 Discharge date: 03/05/2019  Admitted From: Home Disposition:  Home  Recommendations for Outpatient Follow-up:  1. Follow up with PCP in 1-2 weeks 2. Please obtain BMP/CBC in one week your next doctors visit.  3. Pain medication prescribed post operatively.   Home Health: None  Equipment/Devices: Discharge Condition: Stable CODE STATUS: Full code Diet recommendation: Diabetic  Brief/Interim Summary: 83 year old with history of essential hypertension, coronary artery disease status post stent, hyperlipidemia presented with abdominal pain.  He was found to have acute pancreatitis.  Lipase was greater than 7000, CT abdomen pelvis showed acute interstitial edematous pancreatitis with cholelithiasis.  Has been off Plavix now.  LFTs and lipase improved.    Underwent laparoscopic cholecystectomy 03/04/2019.  He was doing well.  The following day he was tolerating oral diet passing gas and denies any further complaints.  Cleared by general surgery to be discharged.  We will discharge him in stable condition today.     Discharge Diagnoses:  Principal Problem:   Pancreatitis Active Problems:   HLD (hyperlipidemia)   Type 2 diabetes mellitus without complication (HCC)   Cholelithiasis   Leukocytosis   Nausea and vomiting   CAD (coronary artery disease)    Acute gallstone pancreatitis Abdominal pain, resolved -Status post laparoscopic cholecystectomy.  Doing well.  Discharging home on pain medications for 3 days thereafter he can use Tylenol.  Transaminitis/elevated total bilirubin, improving - Resolved -Right upper quadrant ultrasound shows gallstones  Essential hypertension -Norvasc 5 mg daily -Metoprolol 50 mg daily   Coronary artery disease -Resume home meds  Diabetes mellitus type 2 - Resume home  medications  Hyperlipidemia - Resume statin  Consultations:  General surgery  Subjective: Doing well no complaints.  He is passing gas this morning.  Tolerating oral diet.  Discharge Exam: Vitals:   03/04/19 2023 03/05/19 0455  BP: (!) 157/81 (!) 180/84  Pulse: 75 73  Resp: 16 17  Temp: 98.2 F (36.8 C) 98.1 F (36.7 C)  SpO2: 96% 100%   Vitals:   03/04/19 1531 03/04/19 1841 03/04/19 2023 03/05/19 0455  BP:  (!) 166/95 (!) 157/81 (!) 180/84  Pulse:  84 75 73  Resp:  18 16 17   Temp:  98.7 F (37.1 C) 98.2 F (36.8 C) 98.1 F (36.7 C)  TempSrc:  Oral Oral Oral  SpO2:  97% 96% 100%  Weight: 80.4 kg     Height: 5\' 9"  (1.753 m)       General: Pt is alert, awake, not in acute distress Cardiovascular: RRR, S1/S2 +, no rubs, no gallops Respiratory: CTA bilaterally, no wheezing, no rhonchi Abdominal: Soft, NT, ND, bowel sounds + Extremities: no edema, no cyanosis Laparoscopic incision site noted on the abdomen without any evidence of bleeding or infection  Discharge Instructions   Allergies as of 03/05/2019   No Known Allergies     Medication List    TAKE these medications   acetaminophen 500 MG tablet Commonly known as: TYLENOL Take 1,000 mg by mouth every 6 (six) hours as needed for mild pain, moderate pain, fever or headache.   clopidogrel 75 MG tablet Commonly known as: PLAVIX Take 1 tablet (75 mg total) by mouth daily.   ezetimibe 10 MG tablet Commonly known as: ZETIA Take 1 tablet (10 mg total) by mouth daily.   metFORMIN 500 MG tablet Commonly known as: GLUCOPHAGE Take 1 tablet (500 mg  total) by mouth daily with breakfast. What changed: when to take this   nitroGLYCERIN 0.4 MG SL tablet Commonly known as: NITROSTAT Place 0.4 mg under the tongue every 5 (five) minutes as needed for chest pain.   oxyCODONE 5 MG immediate release tablet Commonly known as: Oxy IR/ROXICODONE Take 1 tablet (5 mg total) by mouth every 4 (four) hours as needed for up  to 3 days for severe pain.   rosuvastatin 40 MG tablet Commonly known as: CRESTOR Take 1 tablet (40 mg total) by mouth daily.      Follow-up Information    Rometta EmeryGarba, Mohammad L, MD. Schedule an appointment as soon as possible for a visit in 1 week(s).   Specialty: Internal Medicine Contact information: [address] [place] [address] [phone]          No Known Allergies  You were cared for by a hospitalist during your hospital stay. If you have any questions about your discharge medications or the care you received while you were in the hospital after you are discharged, you can call the unit and asked to speak with the hospitalist on call if the hospitalist that took care of you is not available. Once you are discharged, your primary care physician will handle any further medical issues. Please note that no refills for any discharge medications will be authorized once you are discharged, as it is imperative that you return to your primary care physician (or establish a relationship with a primary care physician if you do not have one) for your aftercare needs so that they can reassess your need for medications and monitor your lab values.   Procedures/Studies: Ct Abdomen Pelvis W Contrast  Result Date: 02/27/2019 CLINICAL DATA:  Acute generalized abdominal pain, localized periumbilical EXAM: CT ABDOMEN AND PELVIS WITH CONTRAST TECHNIQUE: Multidetector CT imaging of the abdomen and pelvis was performed using the standard protocol following bolus administration of intravenous contrast. CONTRAST:  100mL OMNIPAQUE IOHEXOL 300 MG/ML  SOLN COMPARISON:  Renal ultrasound November 29, 2017 FINDINGS: Lower chest: Basilar areas of subsegmental atelectasis and/or scarring. Cardiac size is upper limits normal. Extensive atherosclerotic calcifications of the coronary arteries. Aortic leaflets are calcified as well. Mild bilateral gynecomastia. Hepatobiliary: Few subcentimeter hypoattenuating foci in  segment 4 are too small to fully characterize on CT imaging but statistically likely benign (3/22). No concerning hepatic lesions. Smooth surface contour. The gallbladder contains multiple dependently layering calcified gallstones. No gallbladder wall thickening. Small amount of pericholecystic fluid, nonspecific and possibly tracking from the adjacent pancreas. No biliary ductal dilatation or visible calcified intraductal gallstones. Pancreas: Diffuse edematous thickening of the pancreatic parenchyma without focal hypoattenuation or organized collection. No pancreatic ductal dilatation. Extensive peripancreatic stranding and phlegmon with likely reactive intraperitoneal and retroperitoneal free fluid. Spleen: Normal in size without focal abnormality. Adrenals/Urinary Tract: Normal adrenal glands. Extensive renal vascular calcifications. Kidneys are otherwise unremarkable, without renal calculi, suspicious lesion, or hydronephrosis. Circumferential bladder wall thickening. Indentation of the posterior bladder wall by the enlarged prostate which demonstrates median lobe hypertrophy. Stomach/Bowel: Distal esophagus is unremarkable. The stomach is distended with ingested material. Mild thickening of the duodenum particularly segments 3 through 4 and of the proximal jejunum just distal to the ligament of Treitz. No intramural or extraluminal gas. More distal small bowel as in the normal appearance. A normal appendix is visualized. No colonic dilatation or wall thickening. Vascular/Lymphatic: Atherosclerotic plaque within the normal caliber, tortuous aorta. No aneurysm or ectasia. Reactive adenopathy in the upper abdomen. Reproductive: Marked  prostatomegaly. Slight asymmetric hypertrophy of the right seminal vesicle, nonspecific. Other: Small volume of intraperitoneal free fluid likely extending from the pancreas to gallbladder fossa and into the right pericolic gutter to layer dependently within the pelvis. No free  intraperitoneal air. No bowel containing hernias. Musculoskeletal: Multilevel degenerative changes are present in the imaged portions of the spine. Mild S-shaped curvature of the thoracolumbar spine. Additional degenerative features are present in the SI joints and hips. IMPRESSION: 1. Findings consistent with acute interstitial edematous pancreatitis. No evidence of pancreatic necrosis or organized collection. Likely reactive free fluid seen both in the intraperitoneal and retroperitoneal spaces. 2. Mild thickening of the duodenum particularly segments 3 through 4 and of the proximal jejunum just distal to the ligament of Treitz, may reflect reactive change versus less likely enteritis. 3. Cholelithiasis without CT evidence of acute cholecystitis or CT evident choledocholithiasis. If there is clinical concern for biliary obstruction, right upper quadrant ultrasound or MRCP could be obtained. 4. Marked prostatomegaly. Indentation of the posterior bladder wall by the enlarged prostate which demonstrates median lobe hypertrophy. 5. Circumferential bladder wall thickening, likely secondary to chronic outlet obstruction from the enlarged prostate. Correlate with urinalysis to exclude superimposed cystitis. 6. Aortic Atherosclerosis (ICD10-I70.0). Electronically Signed   By: Lovena Le M.D.   On: 02/27/2019 05:24   US Abdomen Limited Ruq  Result Date: 02/27/2019 CLINICAL DATA:  Acute pancreatitis.  Gallstones. EXAM: ULTRASOUND ABDOMEN LIMITED RIGHT UPPER QUADRANT COMPARISON:  CT of the abdomen pelvis 02/27/19. FINDINGS: Gallbladder: Multiple shadowing stones are present in the gallbladder. The largest and measures 1.3 cm. Wall thickness is within normal limits at 2.0 mm. There is no sonographic Murphy sign. Common bile duct: Diameter: 4.0 mm, within normal limits Liver: An 8 mm simple cyst in the left lobe of the liver is stable. No other focal lesions are present. Normal echogenicity is present. Portal vein is patent  on color Doppler imaging with normal direction of blood flow towards the liver. Other: None. IMPRESSION: 1. Cholelithiasis without cholecystitis. Electronically Signed   By: San Morelle M.D.   On: 02/27/2019 07:33      The results of significant diagnostics from this hospitalization (including imaging, microbiology, ancillary and laboratory) are listed below for reference.     Microbiology: Recent Results (from the past 240 hour(s))  SARS Coronavirus 2 [facility] order, Performed in [facility] hospital lab) Nasopharyngeal Nasopharyngeal Swab     Status: None   Collection Time: 02/27/19  5:49 AM   Specimen: Nasopharyngeal Swab  Result Value Ref Range Status   SARS Coronavirus 2 NEGATIVE NEGATIVE Final    Comment: (NOTE) If result is NEGATIVE SARS-CoV-2 target nucleic acids are NOT DETECTED. The SARS-CoV-2 RNA is generally detectable in upper and lower  respiratory specimens during the acute phase of infection. The lowest  concentration of SARS-CoV-2 viral copies this assay can detect is 250  copies / mL. A negative result does not preclude SARS-CoV-2 infection  and should not be used as the sole basis for treatment or other  patient management decisions.  A negative result may occur with  improper specimen collection / handling, submission of specimen other  than nasopharyngeal swab, presence of viral mutation(s) within the  areas targeted by this assay, and inadequate number of viral copies  (<250 copies / mL). A negative result must be combined with clinical  observations, patient history, and epidemiological information. If result is POSITIVE SARS-CoV-2 target nucleic acids are DETECTED. The SARS-CoV-2 RNA is generally detectable in upper  and lower  respiratory specimens dur ing the acute phase of infection.  Positive  results are indicative of active infection with SARS-CoV-2.  Clinical  correlation with patient history and other diagnostic information is  necessary to  determine patient infection status.  Positive results do  not rule out bacterial infection or co-infection with other viruses. If result is PRESUMPTIVE POSTIVE SARS-CoV-2 nucleic acids MAY BE PRESENT.   A presumptive positive result was obtained on the submitted specimen  and confirmed on repeat testing.  While 2019 novel coronavirus  (SARS-CoV-2) nucleic acids may be present in the submitted sample  additional confirmatory testing may be necessary for epidemiological  and / or clinical management purposes  to differentiate between  SARS-CoV-2 and other Sarbecovirus currently known to infect humans.  If clinically indicated additional testing with an alternate test  methodology [phone]) is advised. The SARS-CoV-2 RNA is generally  detectable in upper and lower respiratory sp ecimens during the acute  phase of infection. The expected result is Negative. Fact Sheet for Patients:  BoilerBrush.com.cy Fact Sheet for Healthcare Providers: https://pope.com/ This test is not yet approved or cleared by the [place] FDA and has been authorized for detection and/or diagnosis of SARS-CoV-2 by FDA under an Emergency Use Authorization (EUA).  This EUA will remain in effect (meaning this test can be used) for the duration of the COVID-19 declaration under Section 564(b)(1) of the Act, 21 U.S.C. section 360bbb-3(b)(1), unless the authorization is terminated or revoked sooner. Performed at [facility], [address] [place], [place] [address]   Surgical pcr screen     Status: None   Collection Time: 03/02/19  8:41 PM   Specimen: Nasal Mucosa; Nasal Swab  Result Value Ref Range Status   MRSA, PCR NEGATIVE NEGATIVE Final   Staphylococcus aureus NEGATIVE NEGATIVE Final    Comment: (NOTE) The Xpert SA Assay (FDA approved for NASAL specimens in patients 39 years of age and older), is one component of a comprehensive surveillance program.  It is not intended to diagnose infection nor to guide or monitor treatment. Performed at [facility], [address], [place], [place] [address]      Labs: BNP (last 3 results) No results for input(s): BNP in the last 8760 hours. Basic Metabolic Panel: Recent Labs  Lab 03/01/19 0734 03/02/19 0310 03/03/19 0328 03/04/19 0416 03/05/19 0557  NA 137 138 138 137 136  K 3.0* 3.5 3.2* 3.4* 3.5  CL 102 107 105 105 102  CO2 22 23 23  21* 21*  GLUCOSE 126* 128* 110* 108* 133*  BUN 7* 5* 5* 5* 9  CREATININE 1.09 1.04 0.98 0.95 1.03  CALCIUM 8.4* 8.4* 8.7* 8.7* 8.6*  MG  --  1.9 2.0 2.1 2.1   Liver Function Tests: Recent Labs  Lab 03/01/19 0734 03/02/19 0310 03/03/19 0328 03/04/19 0416 03/05/19 0557  AST 58* 31 20 17 24   ALT 255* 180* 132* 93* 72*  ALKPHOS 73 73 68 68 69  BILITOT 1.2 1.3* 0.8 1.0 0.8  PROT 6.0* 6.1* 6.4* 6.1* 6.8  ALBUMIN 3.0* 2.8* 2.9* 2.7* 2.8*   Recent Labs  Lab 02/27/19 0239 02/28/19 0220 03/01/19 0734  LIPASE 7,221* 1,613* 143*   No results for input(s): AMMONIA in the last 168 hours. CBC: Recent Labs  Lab 02/27/19 0239  03/01/19 0734 03/02/19 0310 03/03/19 0328 03/04/19 0416 03/05/19 0557  WBC 11.7*   < > 10.9* 8.3 6.7 6.5 11.1*  NEUTROABS 10.0*  --   --   --   --   --   --  HGB 14.6   < > 12.3* 12.0* 12.2* 12.1* 12.7*  HCT 47.0   < > 38.1* 37.1* 37.5* 37.4* 38.7*  MCV 86.1   < > 82.8 83.0 83.1 82.7 82.0  PLT 179   < > 142* 149* 163 185 244   < > = values in this interval not displayed.   Cardiac Enzymes: No results for input(s): CKTOTAL, CKMB, CKMBINDEX, TROPONINI in the last 168 hours. BNP: Invalid input(s): POCBNP CBG: Recent Labs  Lab 03/04/19 1007 03/04/19 1218 03/04/19 1646 03/04/19 2101 03/05/19 0755  GLUCAP 132* 132* 182* 178* 139*   D-Dimer No results for input(s): DDIMER in the last 72 hours. Hgb A1c No results for input(s): HGBA1C in the last 72 hours. Lipid Profile No results for input(s): CHOL, HDL,  LDLCALC, TRIG, CHOLHDL, LDLDIRECT in the last 72 hours. Thyroid function studies No results for input(s): TSH, T4TOTAL, T3FREE, THYROIDAB in the last 72 hours.  Invalid input(s): FREET3 Anemia work up No results for input(s): VITAMINB12, FOLATE, FERRITIN, TIBC, IRON, RETICCTPCT in the last 72 hours. Urinalysis    Component Value Date/Time   COLORURINE YELLOW 02/27/2019 0239   APPEARANCEUR CLEAR 02/27/2019 0239   LABSPEC 1.032 (H) 02/27/2019 0239   PHURINE 5.0 02/27/2019 0239   GLUCOSEU NEGATIVE 02/27/2019 0239   HGBUR SMALL (A) 02/27/2019 0239   BILIRUBINUR NEGATIVE 02/27/2019 0239   KETONESUR NEGATIVE 02/27/2019 0239   PROTEINUR 30 (A) 02/27/2019 0239   NITRITE NEGATIVE 02/27/2019 0239   LEUKOCYTESUR NEGATIVE 02/27/2019 0239   Sepsis Labs Invalid input(s): PROCALCITONIN,  WBC,  LACTICIDVEN Microbiology Recent Results (from the past 240 hour(s))  SARS Coronavirus 2 [facility](Hospital order, Performed in [facility] hospital lab) Nasopharyngeal Nasopharyngeal Swab     Status: None   Collection Time: 02/27/19  5:49 AM   Specimen: Nasopharyngeal Swab  Result Value Ref Range Status   SARS Coronavirus 2 NEGATIVE NEGATIVE Final    Comment: (NOTE) If result is NEGATIVE SARS-CoV-2 target nucleic acids are NOT DETECTED. The SARS-CoV-2 RNA is generally detectable in upper and lower  respiratory specimens during the acute phase of infection. The lowest  concentration of SARS-CoV-2 viral copies this assay can detect is 250  copies / mL. A negative result does not preclude SARS-CoV-2 infection  and should not be used as the sole basis for treatment or other  patient management decisions.  A negative result may occur with  improper specimen collection / handling, submission of specimen other  than nasopharyngeal swab, presence of viral mutation(s) within the  areas targeted by this assay, and inadequate number of viral copies  (<250 copies / mL). A negative result must be combined with clinical   observations, patient history, and epidemiological information. If result is POSITIVE SARS-CoV-2 target nucleic acids are DETECTED. The SARS-CoV-2 RNA is generally detectable in upper and lower  respiratory specimens dur ing the acute phase of infection.  Positive  results are indicative of active infection with SARS-CoV-2.  Clinical  correlation with patient history and other diagnostic information is  necessary to determine patient infection status.  Positive results do  not rule out bacterial infection or co-infection with other viruses. If result is PRESUMPTIVE POSTIVE SARS-CoV-2 nucleic acids MAY BE PRESENT.   A presumptive positive result was obtained on the submitted specimen  and confirmed on repeat testing.  While 2019 novel coronavirus  (SARS-CoV-2) nucleic acids may be present in the submitted sample  additional confirmatory testing may be necessary for epidemiological  and / or clinical management purposes  to differentiate between  SARS-CoV-2 and other Sarbecovirus currently known to infect humans.  If clinically indicated additional testing with an alternate test  methodology [phone]) is advised. The SARS-CoV-2 RNA is generally  detectable in upper and lower respiratory sp ecimens during the acute  phase of infection. The expected result is Negative. Fact Sheet for Patients:  BoilerBrush.com.cy Fact Sheet for Healthcare Providers: https://pope.com/ This test is not yet approved or cleared by the [place] FDA and has been authorized for detection and/or diagnosis of SARS-CoV-2 by FDA under an Emergency Use Authorization (EUA).  This EUA will remain in effect (meaning this test can be used) for the duration of the COVID-19 declaration under Section 564(b)(1) of the Act, 21 U.S.C. section 360bbb-3(b)(1), unless the authorization is terminated or revoked sooner. Performed at [facility], [address]  [place], [place] [address]   Surgical pcr screen     Status: None   Collection Time: 03/02/19  8:41 PM   Specimen: Nasal Mucosa; Nasal Swab  Result Value Ref Range Status   MRSA, PCR NEGATIVE NEGATIVE Final   Staphylococcus aureus NEGATIVE NEGATIVE Final    Comment: (NOTE) The Xpert SA Assay (FDA approved for NASAL specimens in patients 14 years of age and older), is one component of a comprehensive surveillance program. It is not intended to diagnose infection nor to guide or monitor treatment. Performed at [facility], [address] [place], [place] [address]      Time coordinating discharge:  I have spent 35 minutes face to face with the patient and on the ward discussing the patients care, assessment, plan and disposition with other care givers. >50% of the time was devoted counseling the patient about the risks and benefits of treatment/Discharge disposition and coordinating care.   SIGNED:   Dimple Nanas, MD  [facility] 03/05/2019, 10:18 AM   If 7PM-7AM, please contact night-coverage [email or web address]

## 2019-03-05 NOTE — Plan of Care (Signed)

## 2019-03-05 NOTE — Progress Notes (Signed)
[name] to be D/C'd  per MD order. Discussed with the patient and all questions fully answered.  VSS, Skin clean, dry and intact without evidence of skin break down, no evidence of skin tears noted.  IV catheter discontinued intact. Site without signs and symptoms of complications. Dressing and pressure applied.  An After Visit Summary was printed and given to the patient. Patient received prescription.  D/c education completed with patient/family including follow up instructions, medication list, d/c activities limitations if indicated, with other d/c instructions as indicated by MD - patient able to verbalize understanding, all questions fully answered.   Patient instructed to return to ED, call 911, or call MD for any changes in condition.   Patient to be escorted via [place], and D/C home via private auto.

## 2019-03-05 NOTE — Plan of Care (Signed)

## 2019-03-05 NOTE — Progress Notes (Signed)
Patient ID: RIO KIDANE, male   DOB: Nov 12, 1935, 83 y.o.   MRN: [id number]    1 Day Post-Op  Subjective: Denies pain. Has not eaten breakfast yet.    Objective: Vital signs in last 24 hours: Temp:  [97 F (36.1 C)-98.7 F (37.1 C)] 98.1 F (36.7 C) (09/07 0455) Pulse Rate:  [69-84] 73 (09/07 0455) Resp:  [13-18] 17 (09/07 0455) BP: (157-191)/(81-98) 180/84 (09/07 0455) SpO2:  [95 %-100 %] 100 % (09/07 0455) Weight:  [80.4 kg] 80.4 kg (09/06 1531) Last BM Date: 03/03/19  Intake/Output from previous day: 09/06 0701 - 09/07 0700 In: 1608.4 [P.O.:150; I.V.:1119.7; IV Piggyback:338.7] Out: 3335 [Urine:3325; Blood:10] Intake/Output this shift: No intake/output data recorded.  PE: Heart: regular Lungs: breathing comfortably Abd: soft, mildly distended, nontender. Incisions c/d/i Gen:  NAD  Lab Results:  Recent Labs    03/04/19 0416 03/05/19 0557  WBC 6.5 11.1*  HGB 12.1* 12.7*  HCT 37.4* 38.7*  PLT 185 244   BMET Recent Labs    03/04/19 0416 03/05/19 0557  NA 137 136  K 3.4* 3.5  CL 105 102  CO2 21* 21*  GLUCOSE 108* 133*  BUN 5* 9  CREATININE 0.95 1.03  CALCIUM 8.7* 8.6*   PT/INR No results for input(s): LABPROT, INR in the last 72 hours. CMP     Component Value Date/Time   NA 136 03/05/2019 0557   K 3.5 03/05/2019 0557   CL 102 03/05/2019 0557   CO2 21 (L) 03/05/2019 0557   GLUCOSE 133 (H) 03/05/2019 0557   BUN 9 03/05/2019 0557   CREATININE 1.03 03/05/2019 0557   CREATININE 0.92 07/17/2015 1418   CALCIUM 8.6 (L) 03/05/2019 0557   PROT 6.8 03/05/2019 0557   PROT 7.6 10/18/2016 0908   ALBUMIN 2.8 (L) 03/05/2019 0557   ALBUMIN 4.4 10/18/2016 0908   AST 24 03/05/2019 0557   ALT 72 (H) 03/05/2019 0557   ALKPHOS 69 03/05/2019 0557   BILITOT 0.8 03/05/2019 0557   BILITOT 0.4 10/18/2016 0908   GFRNONAA >60 03/05/2019 0557   GFRAA >60 03/05/2019 0557   Lipase     Component Value Date/Time   LIPASE 143 (H) 03/01/2019 0734        Studies/Results: No results found.  Anti-infectives: Anti-infectives (From admission, onward)   None       Assessment/Plan CAD s/p stent 2008- takes plavix, last dose 8/31 HTN GERD HLD  Biliary pancreatitis POD 1 lap chole, doing well  OK for discharge from surgery standpoint  ID: no current abx   LOS: 6 days    [facility], [place] [phone] Check [email or web address], password [facility] for coverage night/weekend

## 2019-08-31 ENCOUNTER — Telehealth: Payer: Self-pay

## 2019-08-31 NOTE — Telephone Encounter (Signed)
A cologuard recall letter has been mailed to address on file.

## 2020-05-04 IMAGING — CT CT ABD-PELV W/ CM
2 of 5 series · 14 of 46 positions shown, 16 images · IV contrast (omnipaque)
Comparison: Renal ultrasound November 29, 2017

CLINICAL DATA: Acute generalized abdominal pain, localized
periumbilical

EXAM:
CT ABDOMEN AND PELVIS WITH CONTRAST
TECHNIQUE: Multidetector CT imaging of the abdomen and pelvis was performed
using the standard protocol following bolus administration of
intravenous contrast.
CONTRAST:  100mL OMNIPAQUE IOHEXOL 300 MG/ML  SOLN

[Series 3: a/p w/ 5mm · axial · 0.80mm/px · z∈[+867,+1302]mm · 11 of 99 slices shown, 13 images]
[im 6/99  soft-tissue]
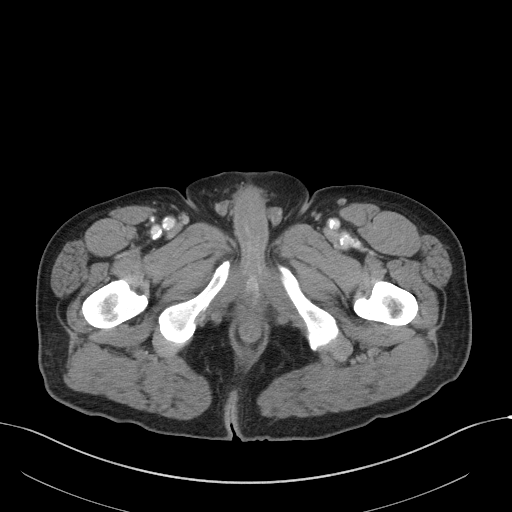
[im 6/99  bone]
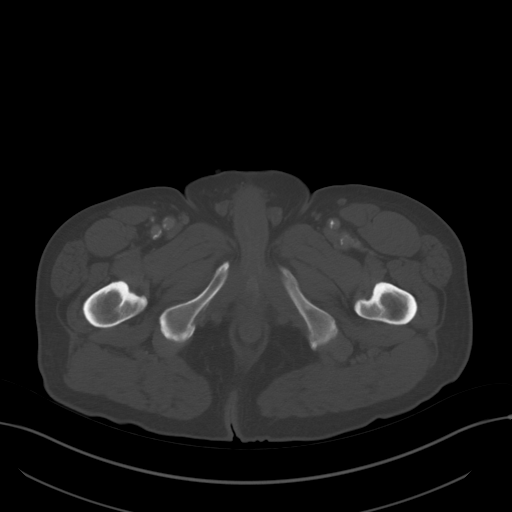
[im 16/99  soft-tissue]
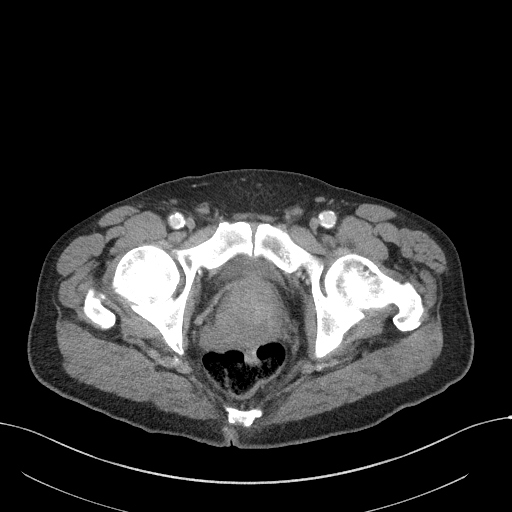
[im 26/99  soft-tissue]
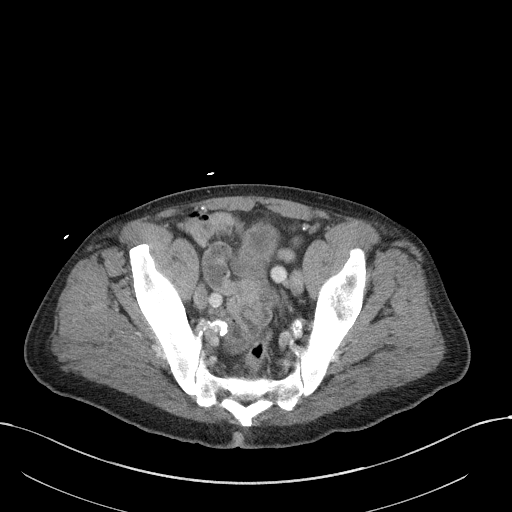
[im 31/99  soft-tissue]
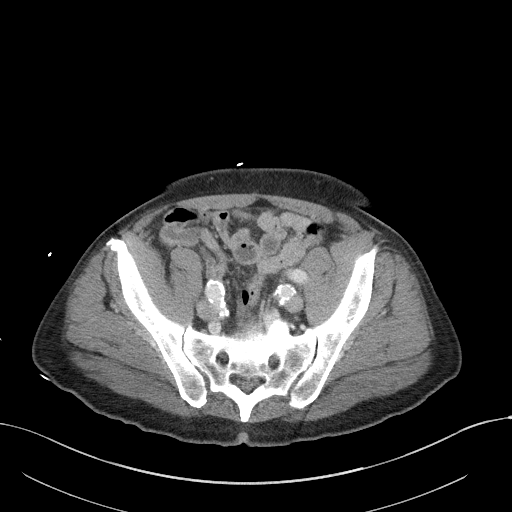
[im 42/99  soft-tissue]
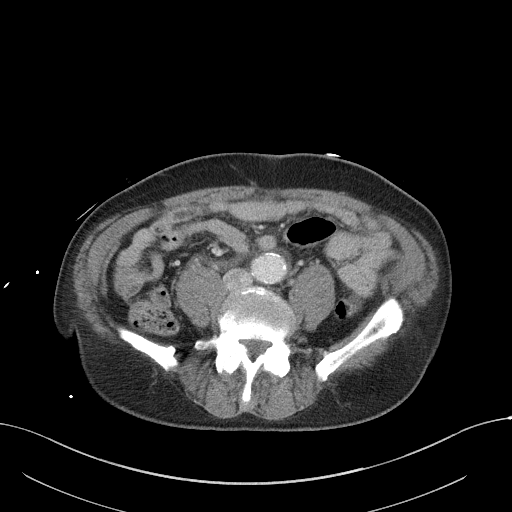
[im 52/99  soft-tissue]
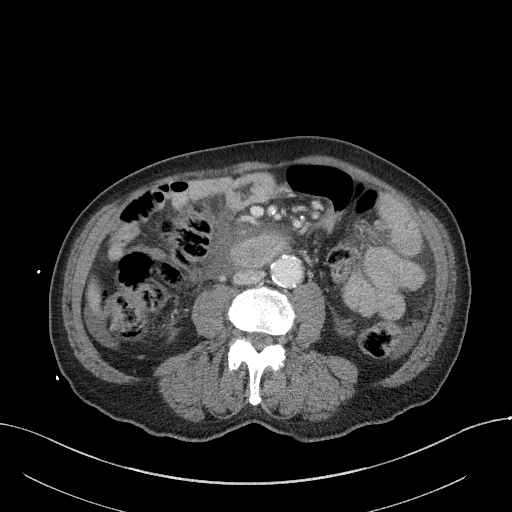
[im 57/99  soft-tissue]
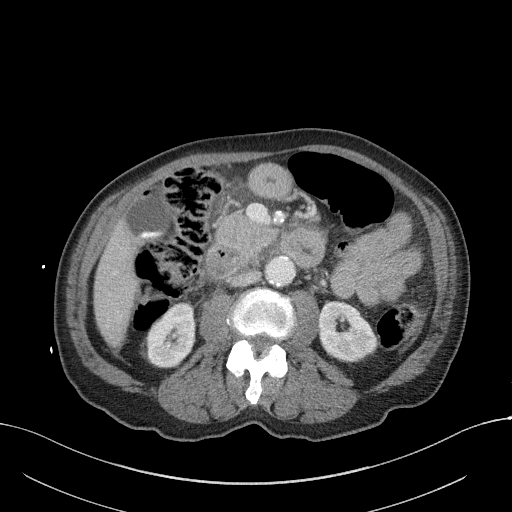
[im 68/99  soft-tissue]
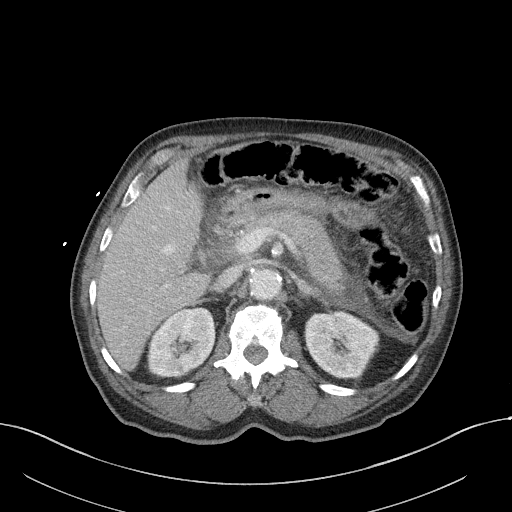
[im 73/99  soft-tissue]
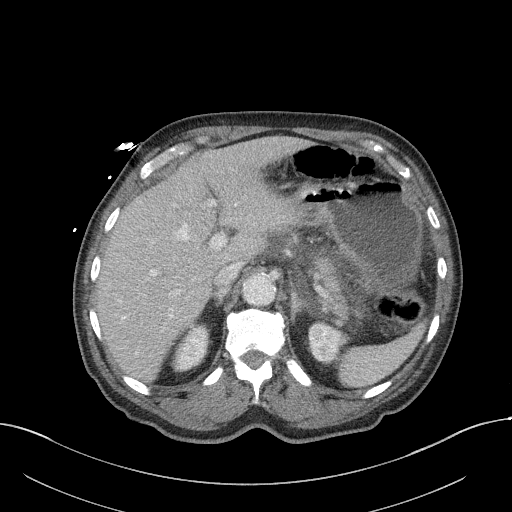
[im 73/99  bone]
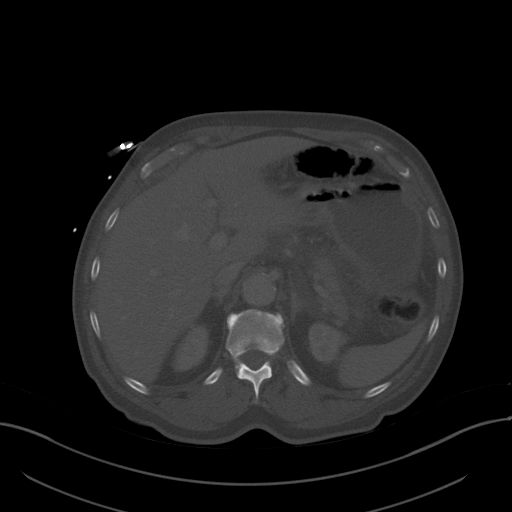
[im 83/99  soft-tissue]
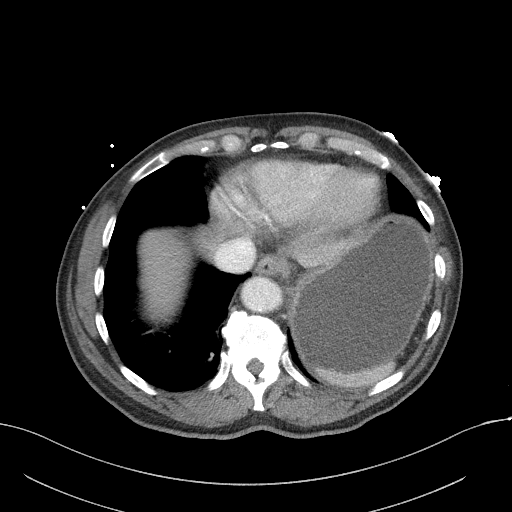
[im 93/99  soft-tissue]
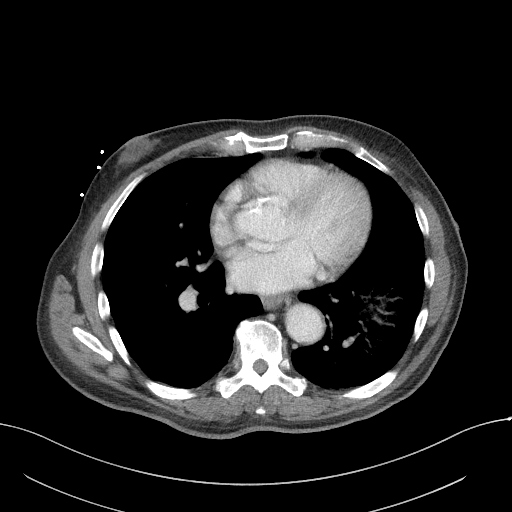

[Series 6: a/p w/ cor · coronal · 0.73mm/px · 3 of 138 slices shown]
[im 46/138  soft-tissue]
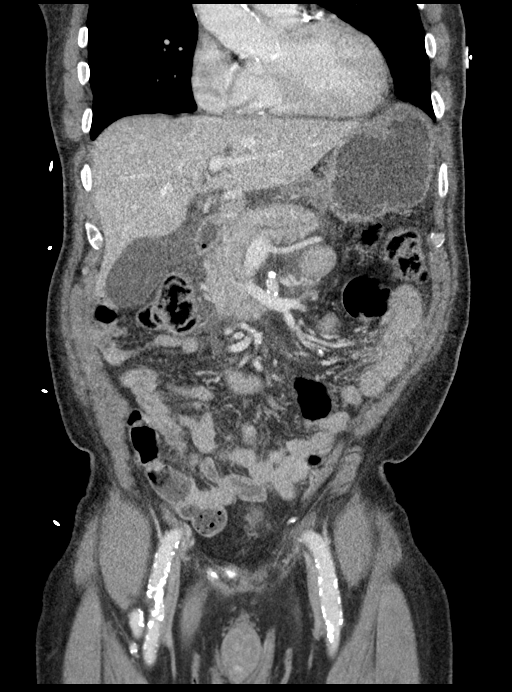
[im 61/138  soft-tissue]
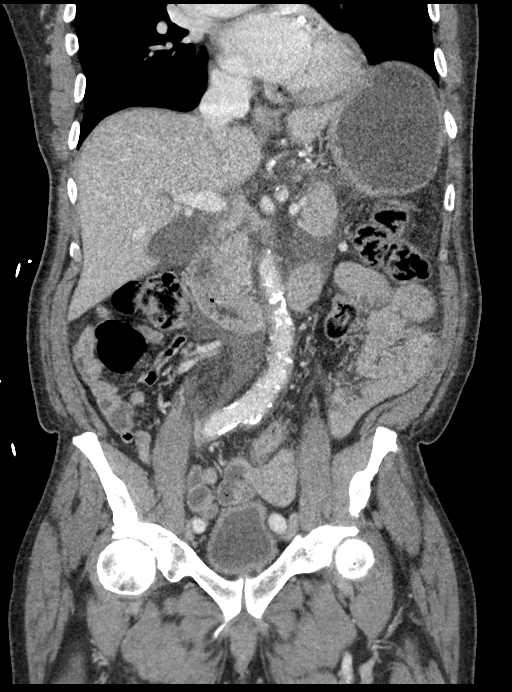
[im 77/138  soft-tissue]
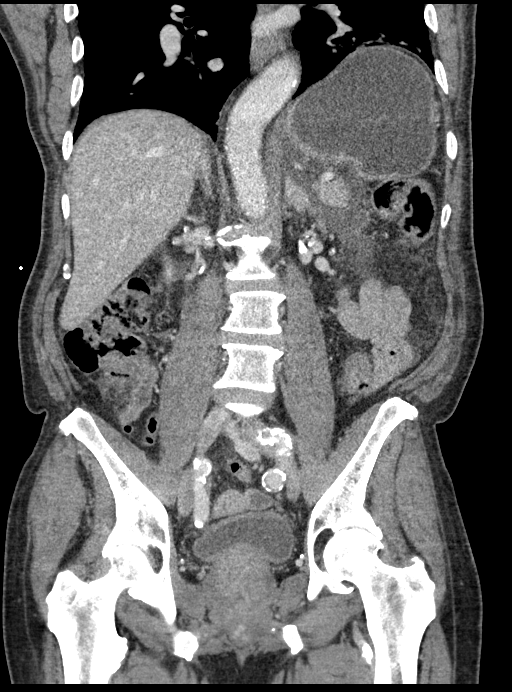

[14 of 46 positions shown; findings below may reference images not displayed]

FINDINGS: Lower chest: Basilar areas of subsegmental atelectasis and/or
scarring. Cardiac size is upper limits normal. Extensive
atherosclerotic calcifications of the coronary arteries. Aortic
leaflets are calcified as well. Mild bilateral gynecomastia.

Hepatobiliary: Few subcentimeter hypoattenuating foci in segment 4
are too small to fully characterize on CT imaging but statistically
likely benign ([DATE]). No concerning hepatic lesions. Smooth surface
contour. The gallbladder contains multiple dependently layering
calcified gallstones. No gallbladder wall thickening. Small amount
of pericholecystic fluid, nonspecific and possibly tracking from the
adjacent pancreas. No biliary ductal dilatation or visible
calcified intraductal gallstones.

Pancreas: Diffuse edematous thickening of the pancreatic parenchyma
without focal hypoattenuation or organized collection. No pancreatic
ductal dilatation. Extensive peripancreatic stranding and phlegmon
with likely reactive intraperitoneal and retroperitoneal free fluid.

Spleen: Normal in size without focal abnormality.

Adrenals/Urinary Tract: Normal adrenal glands. Extensive renal
vascular calcifications. Kidneys are otherwise unremarkable, without
renal calculi, suspicious lesion, or hydronephrosis. Circumferential
bladder wall thickening. Indentation of the posterior bladder wall
by the enlarged prostate which demonstrates median lobe hypertrophy.

Stomach/Bowel: Distal esophagus is unremarkable. The stomach is
distended with ingested material. Mild thickening of the duodenum
particularly segments 3 through 4 and of the proximal jejunum just
distal to the ligament of Treitz. No intramural or extraluminal gas.
More distal small bowel as in the normal appearance. A normal
appendix is visualized. No colonic dilatation or wall thickening.

Vascular/Lymphatic: Atherosclerotic plaque within the normal
caliber, tortuous aorta. No aneurysm or ectasia. Reactive adenopathy
in the upper abdomen.

Reproductive: Marked prostatomegaly. Slight asymmetric hypertrophy
of the right seminal vesicle, nonspecific.

Other: Small volume of intraperitoneal free fluid likely extending
from the pancreas to gallbladder fossa and into the right pericolic
gutter to layer dependently within the pelvis. No free
intraperitoneal air. No bowel containing hernias.

Musculoskeletal: Multilevel degenerative changes are present in the
imaged portions of the spine. Mild S-shaped curvature of the
thoracolumbar spine. Additional degenerative features are present in
the SI joints and hips.
IMPRESSION: 1. Findings consistent with acute interstitial edematous
pancreatitis. No evidence of pancreatic necrosis or organized
collection. Likely reactive free fluid seen both in the
intraperitoneal and retroperitoneal spaces.
2. Mild thickening of the duodenum particularly segments 3 through 4
and of the proximal jejunum just distal to the ligament of Treitz,
may reflect reactive change versus less likely enteritis.
3. Cholelithiasis without CT evidence of acute cholecystitis or CT
evident choledocholithiasis. If there is clinical concern for
biliary obstruction, right upper quadrant ultrasound or MRCP could
be obtained.
4. Marked prostatomegaly. Indentation of the posterior bladder wall
by the enlarged prostate which demonstrates median lobe hypertrophy.
5. Circumferential bladder wall thickening, likely secondary to
chronic outlet obstruction from the enlarged prostate. Correlate
with urinalysis to exclude superimposed cystitis.
6. Aortic Atherosclerosis ([id number]).

## 2020-05-04 IMAGING — US US ABDOMEN LIMITED
1 series · 14 of 25 positions shown · non-contrast
Comparison: CT of the abdomen pelvis 02/27/19.

CLINICAL DATA: Acute pancreatitis.  Gallstones.

EXAM:
ULTRASOUND ABDOMEN LIMITED RIGHT UPPER QUADRANT

[Series 1: us abdomen limited · 14 of 46 slices shown]
[im 1/46]
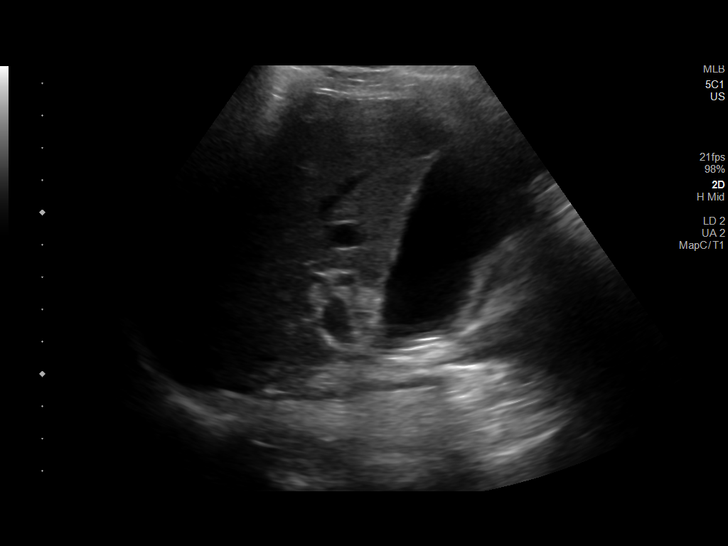
[im 4/46]
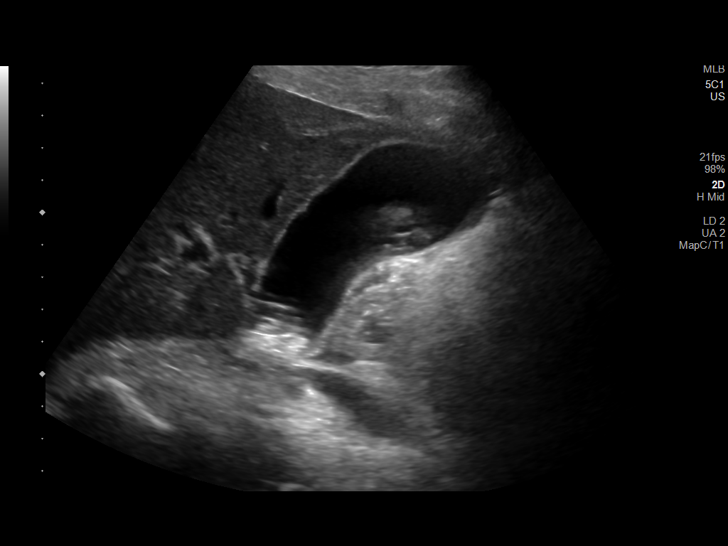
[im 8/46]
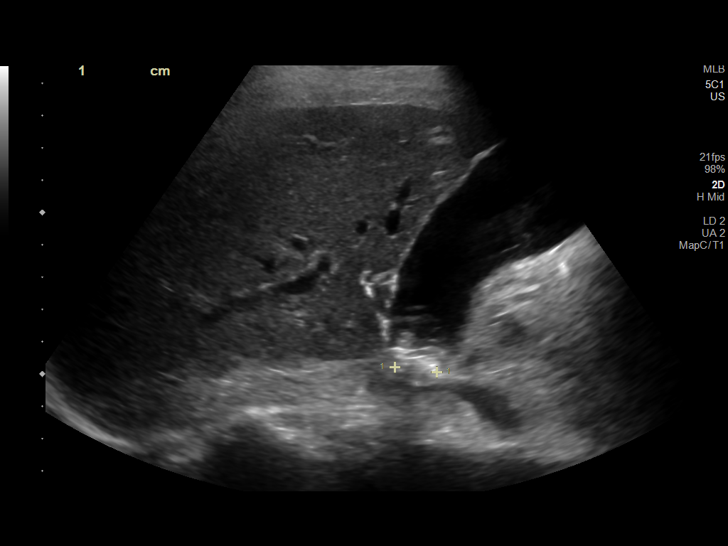
[im 12/46]
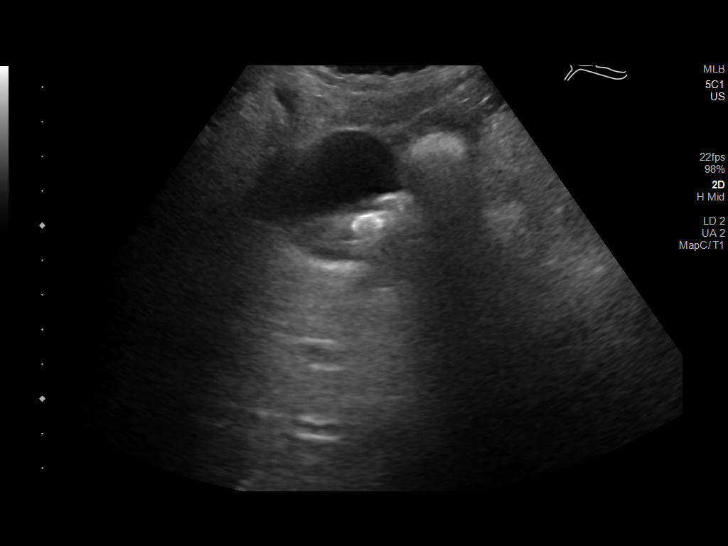
[im 16/46]
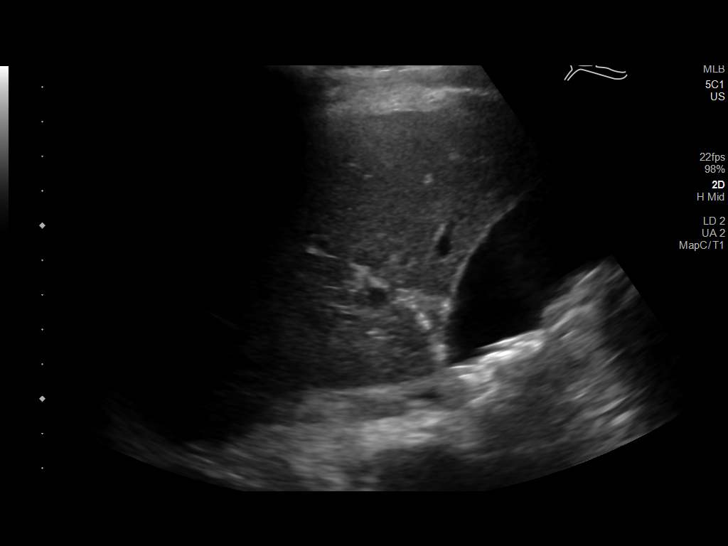
[im 17/46]
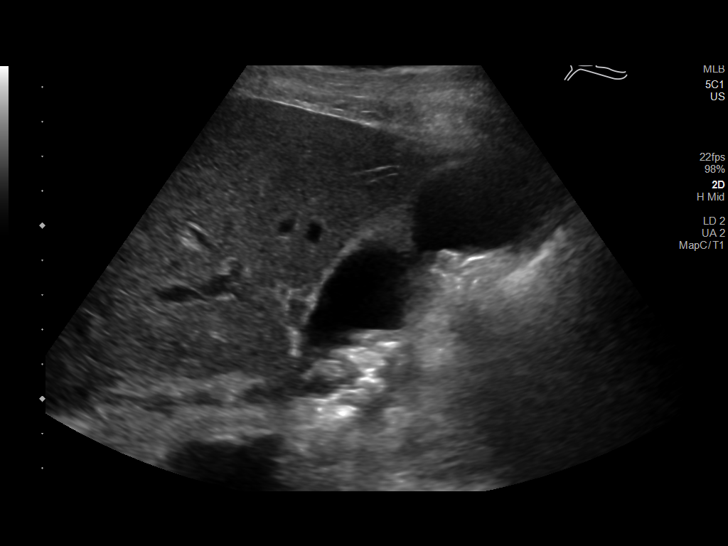
[im 21/46]
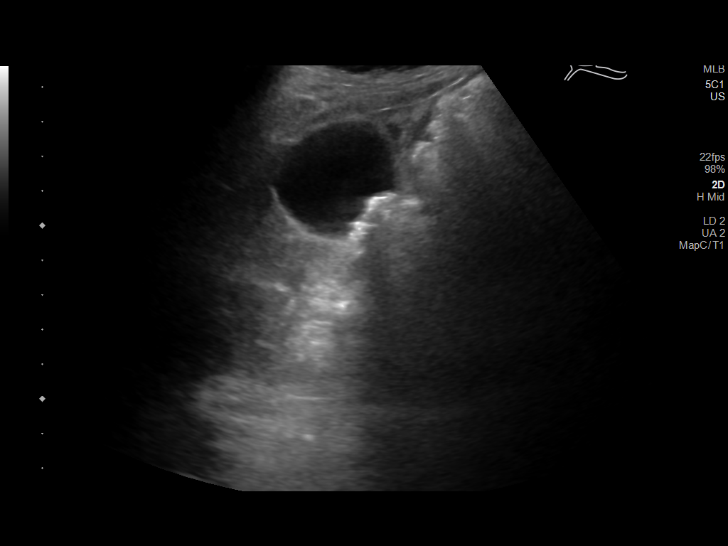
[im 25/46]
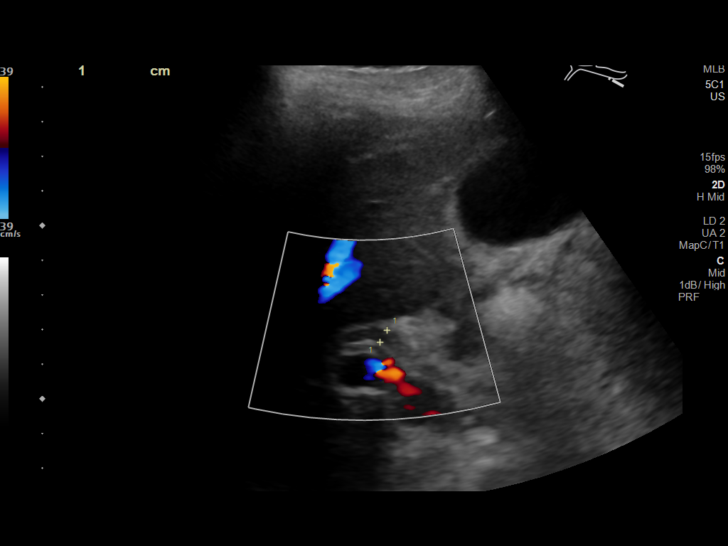
[im 29/46]
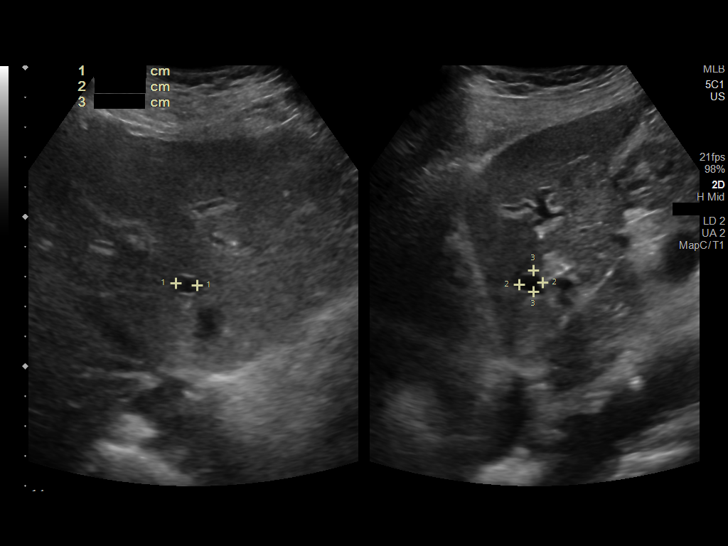
[im 31/46]
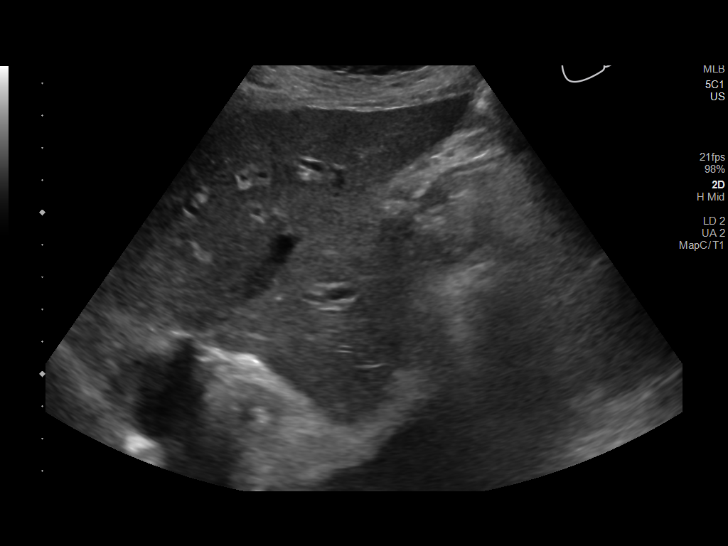
[im 34/46]
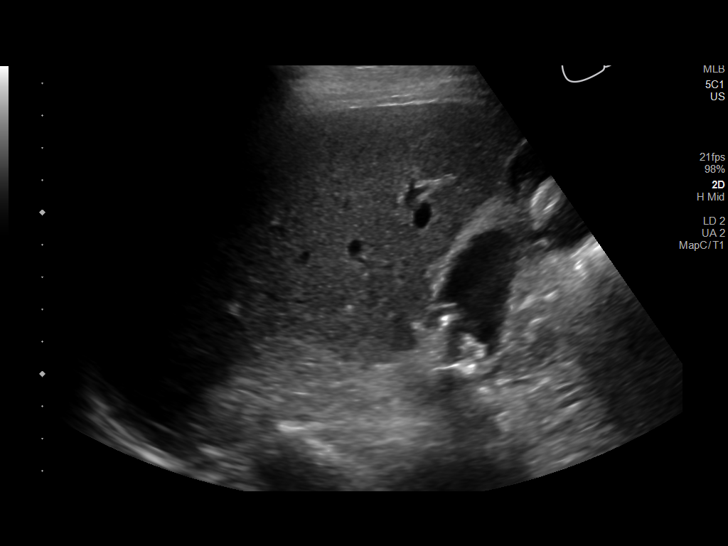
[im 38/46]
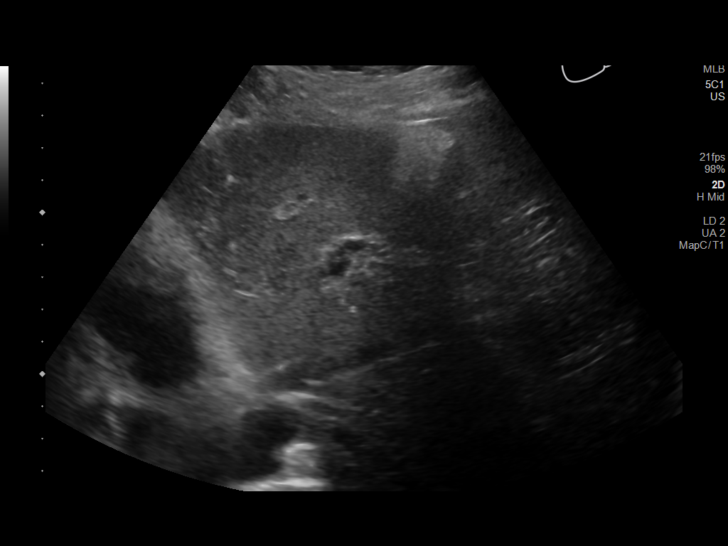
[im 42/46]
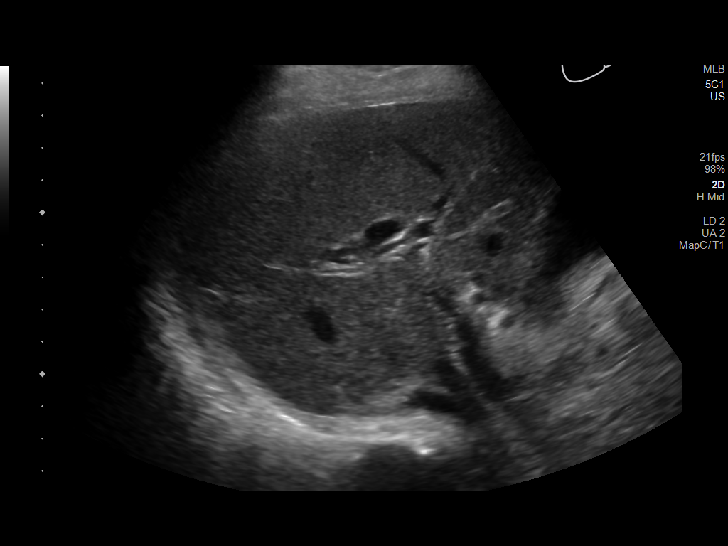
[im 46/46]
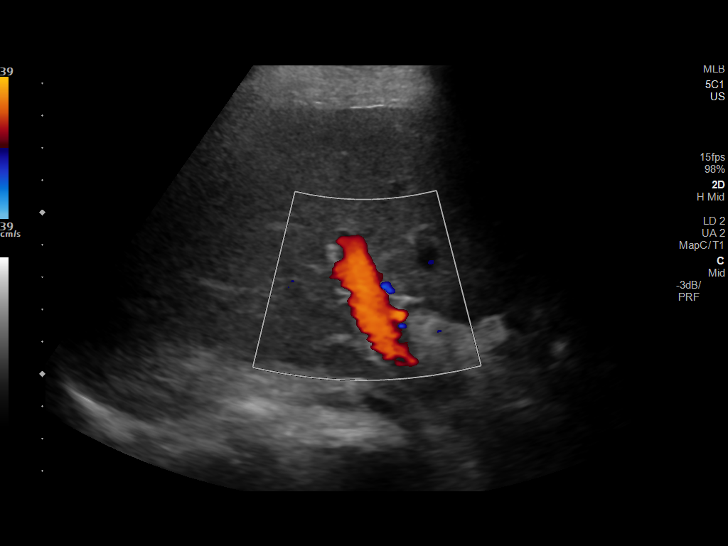

[14 of 25 positions shown; findings below may reference images not displayed]

FINDINGS: Gallbladder:

Multiple shadowing stones are present in the gallbladder. The
largest and measures 1.3 cm. Wall thickness is within normal limits
at 2.0 mm. There is no sonographic Murphy sign.

Common bile duct:

Diameter: 4.0 mm, within normal limits

Liver:

An 8 mm simple cyst in the left lobe of the liver is stable. No
other focal lesions are present. Normal echogenicity is present.
Portal vein is patent on color Doppler imaging with normal direction
of blood flow towards the liver.

Other: None.
IMPRESSION: 1. Cholelithiasis without cholecystitis.

## 2021-06-30 ENCOUNTER — Emergency Department (HOSPITAL_COMMUNITY)
Admission: EM | Admit: 2021-06-30 | Discharge: 2021-06-30 | Disposition: A | Discharge status: Home / Self Care | Payer: Medicare Other | Attending: Student | Admitting: Student

## 2021-06-30 ENCOUNTER — Encounter (HOSPITAL_COMMUNITY): Payer: Self-pay

## 2021-06-30 ENCOUNTER — Other Ambulatory Visit: Payer: Self-pay

## 2021-06-30 DIAGNOSIS — E119 Type 2 diabetes mellitus without complications: Secondary | ICD-10-CM | POA: Diagnosis not present

## 2021-06-30 DIAGNOSIS — Z7901 Long term (current) use of anticoagulants: Secondary | ICD-10-CM | POA: Insufficient documentation

## 2021-06-30 DIAGNOSIS — X58XXXA Exposure to other specified factors, initial encounter: Secondary | ICD-10-CM | POA: Diagnosis not present

## 2021-06-30 DIAGNOSIS — Z7984 Long term (current) use of oral hypoglycemic drugs: Secondary | ICD-10-CM | POA: Insufficient documentation

## 2021-06-30 DIAGNOSIS — R600 Localized edema: Secondary | ICD-10-CM | POA: Diagnosis not present

## 2021-06-30 DIAGNOSIS — S81801A Unspecified open wound, right lower leg, initial encounter: Secondary | ICD-10-CM | POA: Diagnosis not present

## 2021-06-30 DIAGNOSIS — S8991XA Unspecified injury of right lower leg, initial encounter: Secondary | ICD-10-CM | POA: Diagnosis present

## 2021-06-30 LAB — CBC WITH DIFFERENTIAL/PLATELET
Abs Immature Granulocytes: 0.01 10*3/uL (ref 0.00–0.07)
Basophils Absolute: 0 10*3/uL (ref 0.0–0.1)
Basophils Relative: 1 %
Eosinophils Absolute: 0.1 10*3/uL (ref 0.0–0.5)
Eosinophils Relative: 2 %
HCT: 36 % — ABNORMAL LOW (ref 39.0–52.0)
Hemoglobin: 11.2 g/dL — ABNORMAL LOW (ref 13.0–17.0)
Immature Granulocytes: 0 %
Lymphocytes Relative: 20 %
Lymphs Abs: 1.2 10*3/uL (ref 0.7–4.0)
MCH: 27.1 pg (ref 26.0–34.0)
MCHC: 31.1 g/dL (ref 30.0–36.0)
MCV: 87 fL (ref 80.0–100.0)
Monocytes Absolute: 0.7 10*3/uL (ref 0.1–1.0)
Monocytes Relative: 12 %
Neutro Abs: 3.9 10*3/uL (ref 1.7–7.7)
Neutrophils Relative %: 65 %
Platelets: 203 10*3/uL (ref 150–400)
RBC: 4.14 MIL/uL — ABNORMAL LOW (ref 4.22–5.81)
RDW: 14 % (ref 11.5–15.5)
WBC: 6 10*3/uL (ref 4.0–10.5)
nRBC: 0 % (ref 0.0–0.2)

## 2021-06-30 LAB — COMPREHENSIVE METABOLIC PANEL
ALT: 10 U/L (ref 0–44)
AST: 13 U/L — ABNORMAL LOW (ref 15–41)
Albumin: 3.4 g/dL — ABNORMAL LOW (ref 3.5–5.0)
Alkaline Phosphatase: 56 U/L (ref 38–126)
Anion gap: 7 (ref 5–15)
BUN: 15 mg/dL (ref 8–23)
CO2: 28 mmol/L (ref 22–32)
Calcium: 9.2 mg/dL (ref 8.9–10.3)
Chloride: 101 mmol/L (ref 98–111)
Creatinine, Ser: 1.15 mg/dL (ref 0.61–1.24)
GFR, Estimated: >60 mL/min (ref >60)
Glucose, Bld: 119 mg/dL — ABNORMAL HIGH (ref 70–99)
Potassium: 3.3 mmol/L — ABNORMAL LOW (ref 3.5–5.1)
Sodium: 136 mmol/L (ref 135–145)
Total Bilirubin: 0.9 mg/dL (ref 0.3–1.2)
Total Protein: 7 g/dL (ref 6.5–8.1)

## 2021-06-30 LAB — LACTIC ACID, PLASMA: Lactic Acid, Venous: 1.6 mmol/L (ref 0.5–1.9)

## 2021-06-30 MED ORDER — BACITRACIN ZINC 500 UNIT/GM EX OINT: TOPICAL_OINTMENT | Freq: Two times a day (BID) | CUTANEOUS | Status: DC

## 2021-06-30 MED ORDER — DOXYCYCLINE HYCLATE 100 MG PO CAPS: 100.0000 mg | ORAL_CAPSULE | Freq: Two times a day (BID) | ORAL | 0 refills | Status: AC

## 2021-06-30 MED ORDER — BACITRACIN ZINC 500 UNIT/GM EX OINT: 1.0000 "application " | TOPICAL_OINTMENT | Freq: Two times a day (BID) | CUTANEOUS | 0 refills | Status: AC

## 2021-06-30 NOTE — ED Notes (Signed)
Pt wound wrapped, abx cream applied, non adherant dressing and kerlex applied. Pt instructed to follow up with wound care provider.

## 2021-06-30 NOTE — ED Triage Notes (Signed)
Patient here with wound to right lower leg with drainage x 1 month. Denies injury. Complains of intermittent pain to same. Denies fever, no chills

## 2021-06-30 NOTE — ED Notes (Signed)
Pt has large wound around right ankle, there is red-pink muscle exposure on the bottom of the left ankle and purulent yellow drainage on the top. Both legs are swollen, and there is scabbing on the back of the left ankle. Pt is able to wiggle his toes, skin is cool to touch and dry

## 2021-06-30 NOTE — ED Provider Notes (Signed)
[facility]   CSN: [id number] Arrival date & time: 06/30/21  1006     History  No chief complaint on file.   [name] is a 86 y.o. male.  Patient with history of diabetes presents today with chief complaint of right lower leg wound.  He states that same first appeared as a small lesion over a month ago and has been progressively worsening since then.  He states that he has been washing it with soap and water, antibacterial wound cream, and rinsing with alcohol every night without relief.  He has not seen anyone for evaluation of his lower leg wounds prior.  He denies any fevers, chills.  The history is provided by the patient. No language interpreter was used.      Home Medications Prior to Admission medications   Medication Sig Start Date End Date Taking? Authorizing Provider  acetaminophen (TYLENOL) 500 MG tablet Take 1,000 mg by mouth every 6 (six) hours as needed for mild pain, moderate pain, fever or headache.    [provider]  clopidogrel (PLAVIX) 75 MG tablet Take 1 tablet (75 mg total) by mouth daily. 09/15/15   Cassell ClementBrackbill, Thomas, MD  ezetimibe (ZETIA) 10 MG tablet Take 1 tablet (10 mg total) by mouth daily. 08/09/17   Wendall StadeNishan, Peter C, MD  metFORMIN (GLUCOPHAGE) 500 MG tablet Take 1 tablet (500 mg total) by mouth daily with breakfast. Patient taking differently: Take 500 mg by mouth 2 (two) times daily with a meal.  09/15/15   Wendall StadeNishan, Peter C, MD  nitroGLYCERIN (NITROSTAT) 0.4 MG SL tablet Place 0.4 mg under the tongue every 5 (five) minutes as needed for chest pain.    [provider]  rosuvastatin (CRESTOR) 40 MG tablet Take 1 tablet (40 mg total) by mouth daily. 08/09/17 02/27/19  Wendall StadeNishan, Peter C, MD      Allergies    Patient has no known allergies.    Review of Systems   Review of Systems  Constitutional:  Negative for chills and fever.  Gastrointestinal:  Negative for diarrhea, nausea and  vomiting.  Skin:  Positive for wound.  Allergic/Immunologic: Positive for immunocompromised state.  All other systems reviewed and are negative.  Physical Exam Updated Vital Signs BP (!) 160/89 (BP Location: Right Arm)    Pulse 98    Temp 98.5 F (36.9 C) (Oral)    Resp 17    SpO2 97%  Physical Exam Vitals and nursing note reviewed.  Constitutional:      General: He is not in acute distress.    Appearance: Normal appearance. He is normal weight. He is not ill-appearing, toxic-appearing or diaphoretic.  Eyes:     Extraocular Movements: Extraocular movements intact.  Cardiovascular:     Rate and Rhythm: Normal rate and regular rhythm.  Pulmonary:     Effort: Pulmonary effort is normal. No respiratory distress.  Abdominal:     General: Abdomen is flat.     Palpations: Abdomen is soft.  Musculoskeletal:        General: Normal range of motion.     Cervical back: Normal range of motion.     Right lower leg: Edema present.     Left lower leg: Edema present.     Comments: Several erythematous wounds noted to right lower extremity without purulent drainage or significant warmth noted.  Some yellowing around wounds, however patient states that is likely from wound care ointment. See images below for further.  Distal pulses intact and 2+.  Neurological:     General: No focal deficit present.     Mental Status: He is alert.  Psychiatric:        Mood and Affect: Mood normal.        Behavior: Behavior normal.         ED Results / Procedures / Treatments   Labs (all labs ordered are listed, but only abnormal results are displayed) Labs Reviewed  COMPREHENSIVE METABOLIC PANEL - Abnormal; Notable for the following components:      Result Value   Potassium 3.3 (*)    Glucose, Bld 119 (*)    Albumin 3.4 (*)    AST 13 (*)    All other components within normal limits  CBC WITH DIFFERENTIAL/PLATELET - Abnormal; Notable for the following components:   RBC 4.14 (*)    Hemoglobin 11.2  (*)    HCT 36.0 (*)    All other components within normal limits  LACTIC ACID, PLASMA    EKG None  Radiology No results found.  Procedures Procedures    Medications Ordered in ED Medications  bacitracin ointment (has no administration in time range)    ED Course/ Medical Decision Making/ A&P                           Medical Decision Making  This patient presents to the ED for concern of right lower extremity wound.  Same has been present for over a month, he has been attempting at home wound care without success.  He has not established with wound care anywhere, and has not seen anyone for his wounds.    Co morbidities that complicate the patient evaluation  Diabetes    Lab Tests:  I Ordered, and personally interpreted labs.  The pertinent results include:  no leukocytosis, WBC 6.0. Anemia per baseline. Lactic negative.    Medicines ordered and prescription drug management:  I ordered medication including bacitracin  for wound care    Dispostion:  After consideration of the diagnostic results and the patients response to treatment, I feel that the patent would benefit from outpatient wound care management. He wounds do not appear obviously infected, no signs of infection with labs. No purulent drainage appreciated. He is afebrile, non-toxic appearing, and in no acute distress with reassuring vital signs. No signs of sepsis at this time. Feel that care can best be managed with oral antibiotics, wound care education at home with topical bacitracin in the setting of diabetic wounds. His wounds have been dressed in the ER with education on how to do this at home. I have emphasized the importance of him stopping soaking his wounds in alcohol as this will only delay healing. He is understanding. I have also reached out to his primary care doctor to set up outpatient wound care appointment.  He is amenable with plan, educated on red flag symptoms that would prompt immediate  return.  Discharged in stable condition.   This is a shared visit with supervising physician Dr. Posey Rea who has independently evaluated patient & provided guidance in evaluation/management/disposition, in agreement with care     Final Clinical Impression(s) / ED Diagnoses Final diagnoses:  Multiple open wounds of right lower extremity, initial encounter    Rx / DC Orders ED Discharge Orders          Ordered    doxycycline (VIBRAMYCIN) 100 MG capsule  2 times daily  06/30/21 1845    bacitracin ointment  2 times daily        06/30/21 1845          An After Visit Summary was printed and given to the patient.     Vear Clock 06/30/21 1851    Glendora Score, MD 07/01/21 [id number]

## 2021-06-30 NOTE — ED Provider Notes (Signed)
Emergency Medicine Provider Triage Evaluation Note  [name] , a 86 y.o. male  was evaluated in triage.  Pt complains of right leg wound. States that same has been present for a month, he has tried to manage with at home wound care without success. Denies injury. Hx of diabetes. Denies fevers or chills  Review of Systems  Positive:  Negative: See above  Physical Exam  There were no vitals taken for this visit. Gen:   Awake, no distress   Resp:  Normal effort  MSK:   Moves extremities without difficulty  Other:  Wound noted to the lateral right lower leg, drainage present.  Medical Decision Making  Medically screening exam initiated at 10:57 AM.  Appropriate orders placed.  [name] was informed that the remainder of the evaluation will be completed by another provider, this initial triage assessment does not replace that evaluation, and the importance of remaining in the ED until their evaluation is complete.     [name] 06/30/21 1059    Allen Grizzle, MD 06/30/21 1606

## 2021-06-30 NOTE — Discharge Instructions (Addendum)
Your work-up in the ER was reassuring that you do not have a systemic infection at this time.  Feel that your wounds can best be managed on an outpatient basis with oral antibiotics.  I would also like for you to continue dressing your wounds with the technique used by nursing staff this evening.  I have reached out to your primary care doctor to help facilitate setting up wound care outpatient. Please ensure this is done and call to schedule an appointment with your PCP in the next few days for further evaluation and management.  Please return if development of any new or worsening symptoms.

## 2021-07-14 ENCOUNTER — Other Ambulatory Visit: Payer: Self-pay

## 2021-07-14 ENCOUNTER — Encounter (HOSPITAL_BASED_OUTPATIENT_CLINIC_OR_DEPARTMENT_OTHER): Payer: Medicare Other | Attending: Internal Medicine | Admitting: Internal Medicine

## 2021-07-14 DIAGNOSIS — F17210 Nicotine dependence, cigarettes, uncomplicated: Secondary | ICD-10-CM | POA: Diagnosis not present

## 2021-07-14 DIAGNOSIS — E785 Hyperlipidemia, unspecified: Secondary | ICD-10-CM | POA: Insufficient documentation

## 2021-07-14 DIAGNOSIS — I10 Essential (primary) hypertension: Secondary | ICD-10-CM | POA: Diagnosis not present

## 2021-07-14 DIAGNOSIS — I87331 Chronic venous hypertension (idiopathic) with ulcer and inflammation of right lower extremity: Secondary | ICD-10-CM | POA: Insufficient documentation

## 2021-07-14 DIAGNOSIS — L97812 Non-pressure chronic ulcer of other part of right lower leg with fat layer exposed: Secondary | ICD-10-CM | POA: Diagnosis not present

## 2021-07-14 DIAGNOSIS — I2510 Atherosclerotic heart disease of native coronary artery without angina pectoris: Secondary | ICD-10-CM | POA: Diagnosis not present

## 2021-07-14 DIAGNOSIS — I890 Lymphedema, not elsewhere classified: Secondary | ICD-10-CM | POA: Insufficient documentation

## 2021-07-14 DIAGNOSIS — E11622 Type 2 diabetes mellitus with other skin ulcer: Secondary | ICD-10-CM | POA: Insufficient documentation

## 2021-07-15 NOTE — Progress Notes (Signed)
[name], VOGLER ([id number]) Visit Report for 07/14/2021 Chief Complaint Document Details Patient Name: Date of Service: [name], Allen HN L. 07/14/2021 9:00 A M Medical Record Number: [id number] Patient Account Number: [id number] Date of Birth/Sex: Treating RN: 11-25-1935 (86 y.o. Janyth Contes Primary Care Provider: Gala Romney Other Clinician: Referring Provider: Treating Provider/Extender: Dwaine Deter in Treatment: 0 Information Obtained from: Patient Chief Complaint 07/14/2021; patient is here for review of wounds on her right lower leg Electronic Signature(s) Signed: 07/14/2021 4:48:32 PM By: Linton Ham MD Entered By: Linton Ham on 07/14/2021 10:21:49 -------------------------------------------------------------------------------- Debridement Details Patient Name: Date of Service: [name], [place] HN L. 07/14/2021 9:00 A M Medical Record Number: [id number] Patient Account Number: [id number] Date of Birth/Sex: Treating RN: 1936-03-05 (86 y.o. Janyth Contes Primary Care Provider: Gala Romney Other Clinician: Referring Provider: Treating Provider/Extender: Dwaine Deter in Treatment: 0 Debridement Performed for Assessment: Wound #1 Right,Lateral Lower Leg Performed By: Physician Ricard Dillon., MD Debridement Type: Debridement Severity of Tissue Pre Debridement: Fat layer exposed Level of Consciousness (Pre-procedure): Awake and Alert Pre-procedure Verification/Time Out Yes - 10:02 Taken: Start Time: 10:02 T Area Debrided (L x W): otal 6.5 (cm) x 7 (cm) = 45.5 (cm) Tissue and other material debrided: Non-Viable, Eschar, Skin: Epidermis Level: Skin/Epidermis Debridement Description: Selective/Open Wound Instrument: Curette Bleeding: Minimum Hemostasis Achieved: Pressure End Time: 10:04 Procedural Pain: 0 Post Procedural Pain: 0 Response to Treatment: Procedure was tolerated well Level of  Consciousness (Post- Awake and Alert procedure): Post Debridement Measurements of Total Wound Length: (cm) 6.5 Width: (cm) 7 Depth: (cm) 0.2 Volume: (cm) 7.147 Character of Wound/Ulcer Post Debridement: Improved Severity of Tissue Post Debridement: Fat layer exposed Post Procedure Diagnosis Same as Pre-procedure Electronic Signature(s) Signed: 07/14/2021 4:48:32 PM By: Linton Ham MD Signed: 07/15/2021 5:10:09 PM By: Levan Hurst RN, BSN Entered By: Linton Ham on 07/14/2021 10:21:12 -------------------------------------------------------------------------------- Debridement Details Patient Name: Date of Service: [name], [place] HN L. 07/14/2021 9:00 A M Medical Record Number: [id number] Patient Account Number: [id number] Date of Birth/Sex: Treating RN: 12-Jun-1936 (86 y.o. Janyth Contes Primary Care Provider: Gala Romney Other Clinician: Referring Provider: Treating Provider/Extender: Dwaine Deter in Treatment: 0 Debridement Performed for Assessment: Wound #3 Right,Posterior Lower Leg Performed By: Physician Ricard Dillon., MD Debridement Type: Debridement Severity of Tissue Pre Debridement: Fat layer exposed Level of Consciousness (Pre-procedure): Awake and Alert Pre-procedure Verification/Time Out Yes - 10:02 Taken: Start Time: 10:02 T Area Debrided (L x W): otal 4 (cm) x 4 (cm) = 16 (cm) Tissue and other material debrided: Viable, Non-Viable, Eschar, Subcutaneous, Skin: Epidermis Level: Skin/Subcutaneous Tissue Debridement Description: Excisional Instrument: Curette Bleeding: Minimum Hemostasis Achieved: Pressure End Time: 10:04 Procedural Pain: 0 Post Procedural Pain: 0 Response to Treatment: Procedure was tolerated well Level of Consciousness (Post- Awake and Alert procedure): Post Debridement Measurements of Total Wound Length: (cm) 6 Width: (cm) 12.5 Depth: (cm) 0.1 Volume: (cm) 5.89 Character of Wound/Ulcer  Post Debridement: Improved Severity of Tissue Post Debridement: Fat layer exposed Post Procedure Diagnosis Same as Pre-procedure Electronic Signature(s) Signed: 07/14/2021 4:48:32 PM By: Linton Ham MD Signed: 07/15/2021 5:10:09 PM By: Levan Hurst RN, BSN Entered By: Linton Ham on 07/14/2021 10:21:23 -------------------------------------------------------------------------------- HPI Details Patient Name: Date of Service: [name], [place] HN L. 07/14/2021 9:00 A M Medical Record Number: [id number] Patient Account Number: [id number] Date of Birth/Sex: Treating RN: 10/22/1935 (86 y.o. Janyth Contes Primary Care Provider: Gala Romney Other  Clinician: Referring Provider: Treating Provider/Extender: Dwaine Deter in Treatment: 0 History of Present Illness HPI Description: ADMISSION 07/14/2021 This is a pleasant 86 year old man who arrived in clinic today accompanied by his granddaughter. He has had 3 wounds on the right leg over the last 2 months or so. Initially started as blisters that opened and increasing edema in the right leg. He has 3 open areas 1 right lateral 1 right medial and 1 right posterior. He was in the ER on 06/30/2021 and had topical bacitracin to apply to the wound areas. Was seen 2 days later by his primary doctor in addition to the doxycycline that he was given by the ER he was put on Augmentin. He is finished both of these antibiotics. He has worn compression stockings or may be just support stockings until very recently although they were about a year old, his son got them at [facility]. Past medical history includes gallstone pancreatitis, hypertension, hyperlipidemia, type 2 diabetes with last hemoglobin A1c in epic 3 years ago, coronary artery disease. He is still a cigarette smoker. ABI in our clinic was 0.72 on the right although this may have been hampered by the amount of edema in the leg Electronic Signature(s) Signed:  07/14/2021 4:48:32 PM By: Linton Ham MD Entered By: Linton Ham on 07/14/2021 10:25:05 -------------------------------------------------------------------------------- Physical Exam Details Patient Name: Date of Service: [name], [place] HN L. 07/14/2021 9:00 A M Medical Record Number: [id number] Patient Account Number: [id number] Date of Birth/Sex: Treating RN: 1935/08/31 (86 y.o. Janyth Contes Primary Care Provider: Gala Romney Other Clinician: Referring Provider: Treating Provider/Extender: Dwaine Deter in Treatment: 0 Constitutional Patient is hypertensive.. Pulse regular and within target range for patient.Marland Kitchen Respirations regular, non-labored and within target range.. Temperature is normal and within the target range for the patient.Marland Kitchen Appears in no distress. Respiratory work of breathing is normal. Cardiovascular Pedal pulses are palpable at the right posterior tibial and dorsalis pedis. Very significant nonpitting edema in both legs. Skin changes of hemosiderin deposition very adherent skin.. Notes Wound exam; the patient has 3 wounds as noted right lateral right medial and right posterior. I removed eschar and skin from the edges of the lateral wound and subcutaneous debris and eschar from the circumference of the posterior wound. I used an open curette hemostasis with direct pressure Electronic Signature(s) Signed: 07/14/2021 4:48:32 PM By: Linton Ham MD Entered By: Linton Ham on 07/14/2021 10:27:39 -------------------------------------------------------------------------------- Physician Orders Details Patient Name: Date of Service: [name], [place] HN L. 07/14/2021 9:00 A M Medical Record Number: [id number] Patient Account Number: [id number] Date of Birth/Sex: Treating RN: [birth year] (86 y.o. Janyth Contes Primary Care Provider: Gala Romney Other Clinician: Referring Provider: Treating Provider/Extender: Dwaine Deter in Treatment: 0 Verbal / Phone Orders: No Diagnosis Coding Follow-up Appointments ppointment in 1 week. - Dr. Dellia Nims Return A Bathing/ Shower/ Hygiene May shower with protection but do not get wound dressing(s) wet. - Ok to use Market researcher, can purchase at [facility], [facility], or [employer] Edema Control - Lymphedema / SCD / Other Elevate legs to the level of the heart or above for 30 minutes daily and/or when sitting, a frequency of: - throughout the day Avoid standing for long periods of time. Exercise regularly Moisturize legs daily. - left leg daily Home Health dmit to Home Health for wound care. May utilize formulary equivalent dressing for wound treatment orders unless otherwise specified. - A Skilled nursing for wound care once a  week Wound Treatment Wound #1 - Lower Leg Wound Laterality: Right, Lateral Cleanser: Soap and Water 1 x Per Week/7 Days Discharge Instructions: May shower and wash wound with dial antibacterial soap and water prior to dressing change. Cleanser: Wound Cleanser 1 x Per Week/7 Days Discharge Instructions: Cleanse the wound with wound cleanser prior to applying a clean dressing using gauze sponges, not tissue or cotton balls. Peri-Wound Care: Triamcinolone 15 (g) 1 x Per Week/7 Days Discharge Instructions: Use triamcinolone 15 (g) as directed Peri-Wound Care: Sween Lotion (Moisturizing lotion) 1 x Per Week/7 Days Discharge Instructions: Apply moisturizing lotion as directed Prim Dressing: KerraCel Ag Gelling Fiber Dressing, 4x5 in (silver alginate) 1 x Per Week/7 Days ary Discharge Instructions: Apply silver alginate to wound bed as instructed Secondary Dressing: ABD Pad, 8x10 1 x Per Week/7 Days Discharge Instructions: Apply over primary dressing as directed. Secondary Dressing: Zetuvit Plus 4x8 in 1 x Per Week/7 Days Discharge Instructions: Apply over primary dressing as directed. Compression Wrap: ThreePress (3 layer  compression wrap) 1 x Per Week/7 Days Discharge Instructions: Apply three layer compression as directed. Wound #2 - Lower Leg Wound Laterality: Right, Medial Cleanser: Soap and Water 1 x Per Week/7 Days Discharge Instructions: May shower and wash wound with dial antibacterial soap and water prior to dressing change. Cleanser: Wound Cleanser 1 x Per Week/7 Days Discharge Instructions: Cleanse the wound with wound cleanser prior to applying a clean dressing using gauze sponges, not tissue or cotton balls. Peri-Wound Care: Triamcinolone 15 (g) 1 x Per Week/7 Days Discharge Instructions: Use triamcinolone 15 (g) as directed Peri-Wound Care: Sween Lotion (Moisturizing lotion) 1 x Per Week/7 Days Discharge Instructions: Apply moisturizing lotion as directed Prim Dressing: KerraCel Ag Gelling Fiber Dressing, 4x5 in (silver alginate) 1 x Per Week/7 Days ary Discharge Instructions: Apply silver alginate to wound bed as instructed Secondary Dressing: ABD Pad, 8x10 1 x Per Week/7 Days Discharge Instructions: Apply over primary dressing as directed. Secondary Dressing: Zetuvit Plus 4x8 in 1 x Per Week/7 Days Discharge Instructions: Apply over primary dressing as directed. Compression Wrap: ThreePress (3 layer compression wrap) 1 x Per Week/7 Days Discharge Instructions: Apply three layer compression as directed. Wound #3 - Lower Leg Wound Laterality: Right, Posterior Cleanser: Soap and Water 1 x Per Week/7 Days Discharge Instructions: May shower and wash wound with dial antibacterial soap and water prior to dressing change. Cleanser: Wound Cleanser 1 x Per Week/7 Days Discharge Instructions: Cleanse the wound with wound cleanser prior to applying a clean dressing using gauze sponges, not tissue or cotton balls. Peri-Wound Care: Triamcinolone 15 (g) 1 x Per Week/7 Days Discharge Instructions: Use triamcinolone 15 (g) as directed Peri-Wound Care: Sween Lotion (Moisturizing lotion) 1 x Per Week/7  Days Discharge Instructions: Apply moisturizing lotion as directed Prim Dressing: KerraCel Ag Gelling Fiber Dressing, 4x5 in (silver alginate) 1 x Per Week/7 Days ary Discharge Instructions: Apply silver alginate to wound bed as instructed Secondary Dressing: ABD Pad, 8x10 1 x Per Week/7 Days Discharge Instructions: Apply over primary dressing as directed. Secondary Dressing: Zetuvit Plus 4x8 in 1 x Per Week/7 Days Discharge Instructions: Apply over primary dressing as directed. Compression Wrap: ThreePress (3 layer compression wrap) 1 x Per Week/7 Days Discharge Instructions: Apply three layer compression as directed. Services and Therapies rterial Studies- Bilateral w/TBIs - Non healing wounds on right lower leg, faint pulses A Electronic Signature(s) Signed: 07/14/2021 4:48:32 PM By: Linton Ham MD Signed: 07/15/2021 5:10:09 PM By: Levan Hurst RN, BSN Entered By: Donnal Debar,  [place] on 07/14/2021 10:12:32 -------------------------------------------------------------------------------- Problem List Details Patient Name: Date of Service: [name], [place] HN L. 07/14/2021 9:00 A M Medical Record Number: [id number] Patient Account Number: [id number] Date of Birth/Sex: Treating RN: May 22, 1936 (86 y.o. Janyth Contes Primary Care Provider: Gala Romney Other Clinician: Referring Provider: Treating Provider/Extender: Dwaine Deter in Treatment: 0 Active Problems ICD-10 Encounter Code Description Active Date MDM Diagnosis L97.818 Non-pressure chronic ulcer of other part of right lower leg with other specified 07/14/2021 No Yes severity I87.331 Chronic venous hypertension (idiopathic) with ulcer and inflammation of right 07/14/2021 No Yes lower extremity I89.0 Lymphedema, not elsewhere classified 07/14/2021 No Yes E11.622 Type 2 diabetes mellitus with other skin ulcer 07/14/2021 No Yes Inactive Problems Resolved Problems Electronic Signature(s) Signed:  07/14/2021 4:48:32 PM By: Linton Ham MD Entered By: Linton Ham on 07/14/2021 10:12:55 -------------------------------------------------------------------------------- Progress Note Details Patient Name: Date of Service: [name], [place] HN L. 07/14/2021 9:00 A M Medical Record Number: [id number] Patient Account Number: [id number] Date of Birth/Sex: Treating RN: [birth year] (86 y.o. Janyth Contes Primary Care Provider: Gala Romney Other Clinician: Referring Provider: Treating Provider/Extender: Dwaine Deter in Treatment: 0 Subjective Chief Complaint Information obtained from Patient 07/14/2021; patient is here for review of wounds on her right lower leg History of Present Illness (HPI) ADMISSION 07/14/2021 This is a pleasant 86 year old man who arrived in clinic today accompanied by his granddaughter. He has had 3 wounds on the right leg over the last 2 months or so. Initially started as blisters that opened and increasing edema in the right leg. He has 3 open areas 1 right lateral 1 right medial and 1 right posterior. He was in the ER on 06/30/2021 and had topical bacitracin to apply to the wound areas. Was seen 2 days later by his primary doctor in addition to the doxycycline that he was given by the ER he was put on Augmentin. He is finished both of these antibiotics. He has worn compression stockings or may be just support stockings until very recently although they were about a year old, his son got them at [facility]. Past medical history includes gallstone pancreatitis, hypertension, hyperlipidemia, type 2 diabetes with last hemoglobin A1c in epic 3 years ago, coronary artery disease. He is still a cigarette smoker. ABI in our clinic was 0.72 on the right although this may have been hampered by the amount of edema in the leg Patient History Information obtained from Patient. Allergies No Known Drug Allergies Family History Unknown History. Social  History Former smoker - quit 30 years ago, Marital Status - Widowed, Alcohol Use - Never, Drug Use - No History, Caffeine Use - Rarely. Medical History Cardiovascular Patient has history of Coronary Artery Disease, Hypertension, Myocardial Infarction - 2014, Peripheral Venous Disease Endocrine Patient has history of Type II Diabetes Patient is treated with Oral Agents. Blood sugar is not tested. Review of Systems (ROS) Constitutional Symptoms (General Health) Denies complaints or symptoms of Fatigue, Fever, Chills, Marked Weight Change. Eyes Denies complaints or symptoms of Dry Eyes, Vision Changes, Glasses / Contacts. Ear/Nose/Mouth/Throat Denies complaints or symptoms of Chronic sinus problems or rhinitis. Respiratory Denies complaints or symptoms of Chronic or frequent coughs, Shortness of Breath. Gastrointestinal Denies complaints or symptoms of Frequent diarrhea, Nausea, Vomiting. Genitourinary Denies complaints or symptoms of Frequent urination. Integumentary (Skin) Complains or has symptoms of Wounds - wounds on right lower leg. Musculoskeletal Denies complaints or symptoms of Muscle Pain, Muscle Weakness. Neurologic Denies complaints or  symptoms of Numbness/parasthesias. Psychiatric Denies complaints or symptoms of Claustrophobia, Suicidal. Objective Constitutional Patient is hypertensive.. Pulse regular and within target range for patient.Marland Kitchen Respirations regular, non-labored and within target range.. Temperature is normal and within the target range for the patient.Marland Kitchen Appears in no distress. Vitals Time Taken: 9:11 AM, Height: 70 in, Source: Stated, Weight: 195 lbs, Source: Stated, BMI: 28, Temperature: 98.3 F, Pulse: 69 bpm, Respiratory Rate: 18 breaths/min, Blood Pressure: 166/81 mmHg. Respiratory work of breathing is normal. Cardiovascular Pedal pulses are palpable at the right posterior tibial and dorsalis pedis. Very significant nonpitting edema in both legs. Skin  changes of hemosiderin deposition very adherent skin.. General Notes: Wound exam; the patient has 3 wounds as noted right lateral right medial and right posterior. I removed eschar and skin from the edges of the lateral wound and subcutaneous debris and eschar from the circumference of the posterior wound. I used an open curette hemostasis with direct pressure Integumentary (Hair, Skin) Wound #1 status is Open. Original cause of wound was Blister. The date acquired was: 03/28/2021. The wound is located on the Right,Lateral Lower Leg. The wound measures 6.5cm length x 7cm width x 0.2cm depth; 35.736cm^2 area and 7.147cm^3 volume. There is Fat Layer (Subcutaneous Tissue) exposed. There is no tunneling or undermining noted. There is a medium amount of serosanguineous drainage noted. The wound margin is flat and intact. There is large (67- 100%) red, pink granulation within the wound bed. There is a small (1-33%) amount of necrotic tissue within the wound bed including Adherent Slough. Wound #2 status is Open. Original cause of wound was Blister. The date acquired was: 03/28/2021. The wound is located on the Right,Medial Lower Leg. The wound measures 2.3cm length x 2cm width x 0.1cm depth; 3.613cm^2 area and 0.361cm^3 volume. There is Fat Layer (Subcutaneous Tissue) exposed. There is no tunneling or undermining noted. There is a medium amount of serosanguineous drainage noted. The wound margin is flat and intact. There is large (67- 100%) red, pink granulation within the wound bed. There is a small (1-33%) amount of necrotic tissue within the wound bed including Adherent Slough. Wound #3 status is Open. Original cause of wound was Blister. The date acquired was: 03/28/2021. The wound is located on the Right,Posterior Lower Leg. The wound measures 6cm length x 12.5cm width x 0.1cm depth; 58.905cm^2 area and 5.89cm^3 volume. There is Fat Layer (Subcutaneous Tissue) exposed. There is no tunneling or undermining  noted. There is a large amount of serosanguineous drainage noted. The wound margin is flat and intact. There is large (67-100%) red, pink granulation within the wound bed. There is a small (1-33%) amount of necrotic tissue within the wound bed including Adherent Slough. Assessment Active Problems ICD-10 Non-pressure chronic ulcer of other part of right lower leg with other specified severity Chronic venous hypertension (idiopathic) with ulcer and inflammation of right lower extremity Lymphedema, not elsewhere classified Type 2 diabetes mellitus with other skin ulcer Procedures Wound #1 Pre-procedure diagnosis of Wound #1 is a Venous Leg Ulcer located on the Right,Lateral Lower Leg .Severity of Tissue Pre Debridement is: Fat layer exposed. There was a Selective/Open Wound Skin/Epidermis Debridement with a total area of 45.5 sq cm performed by Ricard Dillon., MD. With the following instrument(s): Curette to remove Non-Viable tissue/material. Material removed includes Eschar and Skin: Epidermis and. No specimens were taken. A time out was conducted at 10:02, prior to the start of the procedure. A Minimum amount of bleeding was controlled with Pressure. The procedure  was tolerated well with a pain level of 0 throughout and a pain level of 0 following the procedure. Post Debridement Measurements: 6.5cm length x 7cm width x 0.2cm depth; 7.147cm^3 volume. Character of Wound/Ulcer Post Debridement is improved. Severity of Tissue Post Debridement is: Fat layer exposed. Post procedure Diagnosis Wound #1: Same as Pre-Procedure Pre-procedure diagnosis of Wound #1 is a Venous Leg Ulcer located on the Right,Lateral Lower Leg . There was a Three Layer Compression Therapy Procedure by Levan Hurst, RN. Post procedure Diagnosis Wound #1: Same as Pre-Procedure Wound #3 Pre-procedure diagnosis of Wound #3 is a Venous Leg Ulcer located on the Right,Posterior Lower Leg .Severity of Tissue Pre Debridement is:  Fat layer exposed. There was a Excisional Skin/Subcutaneous Tissue Debridement with a total area of 16 sq cm performed by Ricard Dillon., MD. With the following instrument(s): Curette to remove Viable and Non-Viable tissue/material. Material removed includes Eschar, Subcutaneous Tissue, and Skin: Epidermis. No specimens were taken. A time out was conducted at 10:02, prior to the start of the procedure. A Minimum amount of bleeding was controlled with Pressure. The procedure was tolerated well with a pain level of 0 throughout and a pain level of 0 following the procedure. Post Debridement Measurements: 6cm length x 12.5cm width x 0.1cm depth; 5.89cm^3 volume. Character of Wound/Ulcer Post Debridement is improved. Severity of Tissue Post Debridement is: Fat layer exposed. Post procedure Diagnosis Wound #3: Same as Pre-Procedure Pre-procedure diagnosis of Wound #3 is a Venous Leg Ulcer located on the Right,Posterior Lower Leg . There was a Three Layer Compression Therapy Procedure by Levan Hurst, RN. Post procedure Diagnosis Wound #3: Same as Pre-Procedure Wound #2 Pre-procedure diagnosis of Wound #2 is a Venous Leg Ulcer located on the Right,Medial Lower Leg . There was a Three Layer Compression Therapy Procedure by Levan Hurst, RN. Post procedure Diagnosis Wound #2: Same as Pre-Procedure Plan Follow-up Appointments: Return Appointment in 1 week. - Dr. Dellia Nims Bathing/ Shower/ Hygiene: May shower with protection but do not get wound dressing(s) wet. - Ok to use Market researcher, can purchase at [facility], [facility], or [employer] Edema Control - Lymphedema / SCD / Other: Elevate legs to the level of the heart or above for 30 minutes daily and/or when sitting, a frequency of: - throughout the day Avoid standing for long periods of time. Exercise regularly Moisturize legs daily. - left leg daily Home Health: Admit to [facility] for wound care. May utilize formulary equivalent dressing for wound  treatment orders unless otherwise specified. - Skilled nursing for wound care once a week Services and Therapies ordered were: Arterial Studies- Bilateral w/TBIs - Non healing wounds on right lower leg, faint pulses WOUND #1: - Lower Leg Wound Laterality: Right, Lateral Cleanser: Soap and Water 1 x Per Week/7 Days Discharge Instructions: May shower and wash wound with dial antibacterial soap and water prior to dressing change. Cleanser: Wound Cleanser 1 x Per Week/7 Days Discharge Instructions: Cleanse the wound with wound cleanser prior to applying a clean dressing using gauze sponges, not tissue or cotton balls. Peri-Wound Care: Triamcinolone 15 (g) 1 x Per Week/7 Days Discharge Instructions: Use triamcinolone 15 (g) as directed Peri-Wound Care: Sween Lotion (Moisturizing lotion) 1 x Per Week/7 Days Discharge Instructions: Apply moisturizing lotion as directed Prim Dressing: KerraCel Ag Gelling Fiber Dressing, 4x5 in (silver alginate) 1 x Per Week/7 Days ary Discharge Instructions: Apply silver alginate to wound bed as instructed Secondary Dressing: ABD Pad, 8x10 1 x Per Week/7 Days Discharge Instructions: Apply  over primary dressing as directed. Secondary Dressing: Zetuvit Plus 4x8 in 1 x Per Week/7 Days Discharge Instructions: Apply over primary dressing as directed. Com pression Wrap: ThreePress (3 layer compression wrap) 1 x Per Week/7 Days Discharge Instructions: Apply three layer compression as directed. WOUND #2: - Lower Leg Wound Laterality: Right, Medial Cleanser: Soap and Water 1 x Per Week/7 Days Discharge Instructions: May shower and wash wound with dial antibacterial soap and water prior to dressing change. Cleanser: Wound Cleanser 1 x Per Week/7 Days Discharge Instructions: Cleanse the wound with wound cleanser prior to applying a clean dressing using gauze sponges, not tissue or cotton balls. Peri-Wound Care: Triamcinolone 15 (g) 1 x Per Week/7 Days Discharge  Instructions: Use triamcinolone 15 (g) as directed Peri-Wound Care: Sween Lotion (Moisturizing lotion) 1 x Per Week/7 Days Discharge Instructions: Apply moisturizing lotion as directed Prim Dressing: KerraCel Ag Gelling Fiber Dressing, 4x5 in (silver alginate) 1 x Per Week/7 Days ary Discharge Instructions: Apply silver alginate to wound bed as instructed Secondary Dressing: ABD Pad, 8x10 1 x Per Week/7 Days Discharge Instructions: Apply over primary dressing as directed. Secondary Dressing: Zetuvit Plus 4x8 in 1 x Per Week/7 Days Discharge Instructions: Apply over primary dressing as directed. Com pression Wrap: ThreePress (3 layer compression wrap) 1 x Per Week/7 Days Discharge Instructions: Apply three layer compression as directed. WOUND #3: - Lower Leg Wound Laterality: Right, Posterior Cleanser: Soap and Water 1 x Per Week/7 Days Discharge Instructions: May shower and wash wound with dial antibacterial soap and water prior to dressing change. Cleanser: Wound Cleanser 1 x Per Week/7 Days Discharge Instructions: Cleanse the wound with wound cleanser prior to applying a clean dressing using gauze sponges, not tissue or cotton balls. Peri-Wound Care: Triamcinolone 15 (g) 1 x Per Week/7 Days Discharge Instructions: Use triamcinolone 15 (g) as directed Peri-Wound Care: Sween Lotion (Moisturizing lotion) 1 x Per Week/7 Days Discharge Instructions: Apply moisturizing lotion as directed Prim Dressing: KerraCel Ag Gelling Fiber Dressing, 4x5 in (silver alginate) 1 x Per Week/7 Days ary Discharge Instructions: Apply silver alginate to wound bed as instructed Secondary Dressing: ABD Pad, 8x10 1 x Per Week/7 Days Discharge Instructions: Apply over primary dressing as directed. Secondary Dressing: Zetuvit Plus 4x8 in 1 x Per Week/7 Days Discharge Instructions: Apply over primary dressing as directed. Com pression Wrap: ThreePress (3 layer compression wrap) 1 x Per Week/7 Days Discharge  Instructions: Apply three layer compression as directed. 1. 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 We are going to use silver alginate ABDs and put him in 3 layer compression 3. In spite of the ABI of 0.72 which in itself is a bit concerning, the palpable pulses in his feet and his feet being warm suggest that I could get away with 3 layer compression which the patient definitely needs. 4. I did order full arterial studies given his risk factors which include type 2 diabetes and smoking 5. I am really hopeful he will be able to tolerate the 3 layer compression. He has far too much edema in this leg to heal these wounds otherwise. They are to call if there are problems 6. He has completed a course of Augmentin and doxycycline I do not think he needs any additional antibiotic therapy no cultures were done. 7. He is definitely going to need compression stockings of some form for the rest of his life and I talked to him about this. We will discuss it more when these wounds get closer to closing  Electronic Signature(s) Signed: 07/14/2021 4:48:32 PM By: Linton Ham MD Entered By: Linton Ham on 07/14/2021 10:29:35 -------------------------------------------------------------------------------- HxROS Details Patient Name: Date of Service: [name], [place] [facility] L. 07/14/2021 9:00 A M Medical Record Number: [id number] Patient Account Number: [id number] Date of Birth/Sex: Treating RN: [birth year] (86 y.o. Janyth Contes Primary Care Provider: Gala Romney Other Clinician: Referring Provider: Treating Provider/Extender: Dwaine Deter in Treatment: 0 Information Obtained From Patient Constitutional Symptoms (General Health) Complaints and Symptoms: Negative for: Fatigue; Fever; Chills; Marked Weight Change Eyes Complaints and Symptoms: Negative for: Dry Eyes; Vision Changes; Glasses / Contacts Ear/Nose/Mouth/Throat Complaints and  Symptoms: Negative for: Chronic sinus problems or rhinitis Respiratory Complaints and Symptoms: Negative for: Chronic or frequent coughs; Shortness of Breath Gastrointestinal Complaints and Symptoms: Negative for: Frequent diarrhea; Nausea; Vomiting Genitourinary Complaints and Symptoms: Negative for: Frequent urination Integumentary (Skin) Complaints and Symptoms: Positive for: Wounds - wounds on right lower leg Musculoskeletal Complaints and Symptoms: Negative for: Muscle Pain; Muscle Weakness Neurologic Complaints and Symptoms: Negative for: Numbness/parasthesias Psychiatric Complaints and Symptoms: Negative for: Claustrophobia; Suicidal Hematologic/Lymphatic Cardiovascular Medical History: Positive for: Coronary Artery Disease; Hypertension; Myocardial Infarction - 2014; Peripheral Venous Disease Endocrine Medical History: Positive for: Type II Diabetes Treated with: Oral agents Blood sugar tested every day: No Immunological Oncologic Immunizations Pneumococcal Vaccine: Received Pneumococcal Vaccination: Yes Received Pneumococcal Vaccination On or After 60th Birthday: Yes Implantable Devices None Family and Social History Unknown History: Yes; Former smoker - quit 30 years ago; Marital Status - Widowed; Alcohol Use: Never; Drug Use: No History; Caffeine Use: Rarely; Financial Concerns: No; Food, Clothing or Shelter Needs: No; Support System Lacking: No; Transportation Concerns: No Engineer, maintenance) Signed: 07/14/2021 4:48:32 PM By: Linton Ham MD Signed: 07/15/2021 5:10:09 PM By: Levan Hurst RN, BSN Entered By: Levan Hurst on 07/14/2021 09:57:37 -------------------------------------------------------------------------------- SuperBill Details Patient Name: Date of Service: [name], [place] HN L. 07/14/2021 Medical Record Number: [id number] Patient Account Number: [id number] Date of Birth/Sex: Treating RN: 1936-02-19 (86 y.o. Janyth Contes Primary Care Provider: Gala Romney Other Clinician: Referring Provider: Treating Provider/Extender: Demetrio Lapping Weeks in Treatment: 0 Diagnosis Coding ICD-10 Codes Code Description [phone] Non-pressure chronic ulcer of other part of right lower leg with other specified severity I87.331 Chronic venous hypertension (idiopathic) with ulcer and inflammation of right lower extremity I89.0 Lymphedema, not elsewhere classified E11.622 Type 2 diabetes mellitus with other skin ulcer Facility Procedures CPT4 Code: [id number] Description: 99213 - WOUND CARE VISIT-LEV 3 EST PT Modifier: 25 Quantity: 1 CPT4 Code: [id number] Description: 11042 - DEB SUBQ TISSUE 20 SQ CM/< ICD-10 Diagnosis Description L97.818 Non-pressure chronic ulcer of other part of right lower leg with other specified s Modifier: everity Quantity: 1 CPT4 Code: [id number] Description: [id number] - DEBRIDE WOUND 1ST 20 SQ CM OR < ICD-10 Diagnosis Description L97.818 Non-pressure chronic ulcer of other part of right lower leg with other specified s Modifier: 59 everity Quantity: 1 CPT4 Code: [id number] Description: [id number] - DEBRIDE WOUND EA ADDL 20 SQ CM ICD-10 Diagnosis Description L97.818 Non-pressure chronic ulcer of other part of right lower leg with other specified s Modifier: 59 everity Quantity: 2 Physician Procedures : CPT4 Code Description Modifier [id number] - WC PHYS LEVEL 4 - NEW PT 25 ICD-10 Diagnosis Description L97.818 Non-pressure chronic ulcer of other part of right lower leg with other specified severity I87.331 Chronic venous hypertension (idiopathic)  with ulcer and inflammation of right lower extremity I89.0 Lymphedema, not elsewhere classified E11.622 Type 2 diabetes mellitus  with other skin ulcer Quantity: 1 : [id number] 11042 - WC PHYS SUBQ TISS 20 SQ CM ICD-10 Diagnosis Description L97.818 Non-pressure chronic ulcer of other part of right lower leg with other specified  severity Quantity: 1 : [id number] - WC PHYS DEBR WO ANESTH 20 SQ CM 59 ICD-10 Diagnosis Description L97.818 Non-pressure chronic ulcer of other part of right lower leg with other specified severity Quantity: 1 : [id number] - WC PHYS DEBR WO ANESTH EA ADD 20 CM 59 ICD-10 Diagnosis Description L97.818 Non-pressure chronic ulcer of other part of right lower leg with other specified severity Quantity: 2 Electronic Signature(s) Signed: 07/15/2021 3:43:46 PM By: Linton Ham MD Signed: 07/15/2021 5:10:09 PM By: Levan Hurst RN, BSN Previous Signature: 07/14/2021 4:48:32 PM Version By: Linton Ham MD Entered By: Levan Hurst on 07/14/2021 17:56:25

## 2021-07-15 NOTE — Progress Notes (Signed)
KHARI, MALLY ([id number]) Visit Report for 07/14/2021 Allergy List Details Patient Name: Date of Service: [name], Allen HN L. 07/14/2021 9:00 A M Medical Record Number: [id number] Patient Account Number: [id number] Date of Birth/Sex: Treating RN: [birth year] (86 y.o. [name] Primary Care Shatavia Santor: [name] Other Clinician: Referring Liyana Suniga: Treating Venia Riveron/Extender: Wilmer Floor Weeks in Treatment: 0 Allergies Active Allergies No Known Drug Allergies Allergy Notes Electronic Signature(s) Signed: 07/15/2021 5:10:09 PM By: Zandra Abts RN, BSN Entered By: Zandra Abts on 07/14/2021 09:15:00 -------------------------------------------------------------------------------- Arrival Information Details Patient Name: Date of Service: [name], Allen HN L. 07/14/2021 9:00 A M Medical Record Number: [id number] Patient Account Number: [id number] Date of Birth/Sex: Treating RN: [birth year] (86 y.o. [name] Primary Care Song Garris: [name] Other Clinician: Referring Rubina Basinski: Treating Mickaela Starlin/Extender: Anabel Bene in Treatment: 0 Visit Information Patient Arrived: Ambulatory Arrival Time: 09:11 Accompanied By: granddaughter Transfer Assistance: None Patient Identification Verified: Yes Secondary Verification Process Completed: Yes Patient Requires Transmission-Based Precautions: No Patient Has Alerts: No Electronic Signature(s) Signed: 07/15/2021 5:10:09 PM By: Zandra Abts RN, BSN Entered By: Zandra Abts on 07/14/2021 09:12:47 -------------------------------------------------------------------------------- Clinic Level of Care Assessment Details Patient Name: Date of Service: [name], Allen HN L. 07/14/2021 9:00 A M Medical Record Number: [id number] Patient Account Number: [id number] Date of Birth/Sex: Treating RN: 01/11/1936 (86 y.o. [name] Primary Care Merriel Zinger: [name] Other  Clinician: Referring Andreah Goheen: Treating Oluwaseyi Tull/Extender: Anabel Bene in Treatment: 0 Clinic Level of Care Assessment Items TOOL 1 Quantity Score X- 1 0 Use when EandM and Procedure is performed on INITIAL visit ASSESSMENTS - Nursing Assessment / Reassessment X- 1 20 General Physical Exam (combine w/ comprehensive assessment (listed just below) when performed on new pt. evals) X- 1 25 Comprehensive Assessment (HX, ROS, Risk Assessments, Wounds Hx, etc.) ASSESSMENTS - Wound and Skin Assessment / Reassessment []  - 0 Dermatologic / Skin Assessment (not related to wound area) ASSESSMENTS - Ostomy and/or Continence Assessment and Care []  - 0 Incontinence Assessment and Management []  - 0 Ostomy Care Assessment and Management (repouching, etc.) PROCESS - Coordination of Care X - Simple Patient / Family Education for ongoing care 1 15 []  - 0 Complex (extensive) Patient / Family Education for ongoing care X- 1 10 Staff obtains , Records, T Results / Process Orders est []  - 0 Staff telephones HHA, Nursing Homes / Clarify orders / etc []  - 0 Routine Transfer to another Facility (non-emergent condition) []  - 0 Routine Hospital Admission (non-emergent condition) X- 1 15 New Admissions / / Ordering NPWT Apligraf, etc. , []  - 0 Emergency Hospital Admission (emergent condition) PROCESS - Special Needs []  - 0 Pediatric / Minor Patient Management []  - 0 Isolation Patient Management []  - 0 Hearing / Language / Visual special needs []  - 0 Assessment of Community assistance (transportation, D/C planning, etc.) []  - 0 Additional assistance / Altered mentation []  - 0 Support Surface(s) Assessment (bed, cushion, seat, etc.) INTERVENTIONS - Miscellaneous []  - 0 External ear exam []  - 0 Patient Transfer (multiple staff / / Similar devices) []  - 0 Simple Staple / Suture removal (25 or less) []  - 0 Complex Staple  / Suture removal (26 or more) []  - 0 Hypo/Hyperglycemic Management (do not check if billed separately) X- 1 15 Ankle / Brachial Index (ABI) - do not check if billed separately Has the patient been seen at the hospital within the last three years:  Yes Total Score: 100 Level Of Care: New/Established - Level 3 Electronic Signature(s) Signed: 07/15/2021 5:10:09 PM By: Zandra Abts RN, BSN Entered By: Zandra Abts on 07/14/2021 17:56:10 -------------------------------------------------------------------------------- Compression Therapy Details Patient Name: Date of Service: [name], Allen HN L. 07/14/2021 9:00 A M Medical Record Number: [id number] Patient Account Number: [id number] Date of Birth/Sex: Treating RN: August 10, 1935 (86 y.o. [name] Primary Care Nicosha Struve: [name] Other Clinician: Referring Amera Banos: Treating Yalda Herd/Extender: Anabel Bene in Treatment: 0 Compression Therapy Performed for Wound Assessment: Wound #1 Right,Lateral Lower Leg Performed By: Clinician Zandra Abts, RN Compression Type: Three Layer Post Procedure Diagnosis Same as Pre-procedure Electronic Signature(s) Signed: 07/15/2021 5:10:09 PM By: Zandra Abts RN, BSN Entered By: Zandra Abts on 07/14/2021 10:08:11 -------------------------------------------------------------------------------- Compression Therapy Details Patient Name: Date of Service: [name], Allen HN L. 07/14/2021 9:00 A M Medical Record Number: [id number] Patient Account Number: [id number] Date of Birth/Sex: Treating RN: [birth year] (86 y.o. [name] Primary Care Edahi Kroening: [name] Other Clinician: Referring Raniah Karan: Treating Satin Boal/Extender: Anabel Bene in Treatment: 0 Compression Therapy Performed for Wound Assessment: Wound #2 Right,Medial Lower Leg Performed By: Clinician Zandra Abts, RN Compression Type: Three Layer Post Procedure  Diagnosis Same as Pre-procedure Electronic Signature(s) Signed: 07/15/2021 5:10:09 PM By: Zandra Abts RN, BSN Entered By: Zandra Abts on 07/14/2021 10:08:11 -------------------------------------------------------------------------------- Compression Therapy Details Patient Name: Date of Service: [name], Allen HN L. 07/14/2021 9:00 A M Medical Record Number: [id number] Patient Account Number: [id number] Date of Birth/Sex: Treating RN: 06-10-1936 (86 y.o. [name] Primary Care Daren Doswell: [name] Other Clinician: Referring Latasia Silberstein: Treating Kriste Broman/Extender: Anabel Bene in Treatment: 0 Compression Therapy Performed for Wound Assessment: Wound #3 Right,Posterior Lower Leg Performed By: Clinician Zandra Abts, RN Compression Type: Three Layer Post Procedure Diagnosis Same as Pre-procedure Electronic Signature(s) Signed: 07/15/2021 5:10:09 PM By: Zandra Abts RN, BSN Entered By: Zandra Abts on 07/14/2021 10:08:11 -------------------------------------------------------------------------------- Encounter Discharge Information Details Patient Name: Date of Service: [name], Allen HN L. 07/14/2021 9:00 A M Medical Record Number: [id number] Patient Account Number: [id number] Date of Birth/Sex: Treating RN: 03-30-1936 (86 y.o. [name] Primary Care Gertrude Bucks: [name] Other Clinician: Referring Yamilet Mcfayden: Treating Jet Armbrust/Extender: Anabel Bene in Treatment: 0 Encounter Discharge Information Items Post Procedure Vitals Discharge Condition: Stable Temperature (F): 98.3 Ambulatory Status: Ambulatory Pulse (bpm): 69 Discharge Destination: Home Respiratory Rate (breaths/min): 18 Transportation: Private Auto Blood Pressure (mmHg): 166/81 Accompanied By: granddaughter Schedule Follow-up Appointment: Yes Clinical Summary of Care: Patient Declined Electronic Signature(s) Signed: 07/15/2021  5:10:09 PM By: Zandra Abts RN, BSN Entered By: Zandra Abts on 07/14/2021 17:59:11 -------------------------------------------------------------------------------- Lower Extremity Assessment Details Patient Name: Date of Service: [name], Allen HN L. 07/14/2021 9:00 A M Medical Record Number: [id number] Patient Account Number: [id number] Date of Birth/Sex: Treating RN: 1936-02-08 (86 y.o. [name] Primary Care Quintrell Baze: [name] Other Clinician: Referring Jayshawn Colston: Treating Toshiko Kemler/Extender: Wilmer Floor Weeks in Treatment: 0 Edema Assessment Assessed: Kyra Searles: No] [Right: No] E[Left: dema] [Right: :] Calf Left: Right: Point of Measurement: 33 cm From Medial Instep 44.2 cm Ankle Left: Right: Point of Measurement: 12 cm From Medial Instep 31.8 cm Knee To Floor Left: Right: From Medial Instep 43 cm Vascular Assessment Pulses: Dorsalis Pedis Palpable: [Right:Yes] Blood Pressure: Brachial: [Right:166] Ankle: [Right:Dorsalis Pedis: 120 0.72] Electronic Signature(s) Signed: 07/15/2021 5:10:09 PM By: Zandra Abts RN, BSN Entered By: Zandra Abts on 07/14/2021 09:50:50 -------------------------------------------------------------------------------- Multi  Wound Chart Details Patient Name: Date of Service: [name], Allen HN L. 07/14/2021 9:00 A M Medical Record Number: [id number] Patient Account Number: [id number] Date of Birth/Sex: Treating RN: May 12, 1936 (86 y.o. Allen KollerM) Buck, [name] Primary Care Harless Molinari: Earlie LouGarba, Mohammad Other Clinician: Referring Assata Juncaj: Treating Kelcey Korus/Extender: [name], [name], Mohammad Weeks in Treatment: 0 Vital Signs Height(in): 70 Pulse(bpm): 69 Weight(lbs): 195 Blood Pressure(mmHg): 166/81 Body Mass Index(BMI): 28 Temperature(F): 98.3 Respiratory Rate(breaths/min): 18 Photos: Right, Lateral Lower Leg Right, Medial Lower Leg Right, Posterior Lower Leg Wound Location: Blister Blister  Blister Wounding Event: Venous Leg Ulcer Venous Leg Ulcer Venous Leg Ulcer Primary Etiology: 03/28/2021 03/28/2021 03/28/2021 Date Acquired: 0 0 0 Weeks of Treatment: Open Open Open Wound Status: 6.5x7x0.2 2.3x2x0.1 6x12.5x0.1 Measurements L x W x D (cm) 35.736 3.613 58.905 A (cm) : rea 7.147 0.361 5.89 Volume (cm) : 0.00% 0.00% 0.00% % Reduction in A rea: 0.00% 0.00% 0.00% % Reduction in Volume: Full Thickness Without Exposed Full Thickness Without Exposed Full Thickness Without Exposed Classification: Support Structures Support Structures Support Structures Medium Medium Large Exudate A mount: Serosanguineous Serosanguineous Serosanguineous Exudate Type: red, brown red, brown red, brown Exudate Color: Flat and Intact Flat and Intact Flat and Intact Wound Margin: Large (67-100%) Large (67-100%) Large (67-100%) Granulation A mount: Red, Pink Red, Pink Red, Pink Granulation Quality: Small (1-33%) Small (1-33%) Small (1-33%) Necrotic A mount: Fat Layer (Subcutaneous Tissue): Yes Fat Layer (Subcutaneous Tissue): Yes Fat Layer (Subcutaneous Tissue): Yes Exposed Structures: Fascia: No Fascia: No Fascia: No Tendon: No Tendon: No Tendon: No Muscle: No Muscle: No Muscle: No Joint: No Joint: No Joint: No Bone: No Bone: No Bone: No None None None Epithelialization: Debridement - Selective/Open Wound N/A Debridement - Excisional Debridement: Pre-procedure Verification/Time Out 10:02 N/A 10:02 Taken: Necrotic/Eschar N/A Necrotic/Eschar, Subcutaneous Tissue Debrided: Skin/Epidermis N/A Skin/Subcutaneous Tissue Level: 45.5 N/A 16 Debridement A (sq cm): rea Curette N/A Curette Instrument: Minimum N/A Minimum Bleeding: Pressure N/A Pressure Hemostasis A chieved: 0 N/A 0 Procedural Pain: 0 N/A 0 Post Procedural Pain: Procedure was tolerated well N/A Procedure was tolerated well Debridement Treatment Response: 6.5x7x0.2 N/A 6x12.5x0.1 Post Debridement  Measurements L x W x D (cm) 7.147 N/A 5.89 Post Debridement Volume: (cm) Compression Therapy Compression Therapy Compression Therapy Procedures Performed: Debridement Debridement Treatment Notes Electronic Signature(s) Signed: 07/14/2021 4:48:32 PM By: Baltazar Najjarobson, Michael MD Signed: 07/15/2021 5:10:09 PM By: Zandra AbtsLynch, Shatara RN, BSN Entered By: Baltazar Najjarobson, Allen on 07/14/2021 10:21:02 -------------------------------------------------------------------------------- Multi-Disciplinary Care Plan Details Patient Name: Date of Service: [name], Allen HN L. 07/14/2021 9:00 A M Medical Record Number: [id number] Patient Account Number: [id number] Date of Birth/Sex: Treating RN: May 12, 1936 (86 y.o. Allen KollerM) Buck, [name] Primary Care Divine Hansley: Earlie LouGarba, Mohammad Other Clinician: Referring Hena Ewalt: Treating Aulden Calise/Extender: [name], [name], Mohammad Weeks in Treatment: 0 Multidisciplinary Care Plan reviewed with physician Active Inactive Venous Leg Ulcer Nursing Diagnoses: Knowledge deficit related to disease process and management Goals: Patient will maintain optimal edema control Date Initiated: 07/14/2021 Target Resolution Date: 08/14/2021 Goal Status: Active Patient/caregiver will verbalize understanding of disease process and disease management Date Initiated: 07/14/2021 Target Resolution Date: 08/14/2021 Goal Status: Active Interventions: Assess peripheral edema status every visit. Compression as ordered Provide education on venous insufficiency Notes: Wound/Skin Impairment Nursing Diagnoses: Impaired tissue integrity Knowledge deficit related to ulceration/compromised skin integrity Goals: Patient/caregiver will verbalize understanding of skin care regimen Date Initiated: 07/14/2021 Target Resolution Date: 08/14/2021 Goal Status: Active Ulcer/skin breakdown will have a volume reduction of 30% by week 4 Date Initiated: 07/14/2021 Target Resolution  Date: 08/14/2021 Goal Status:  Active Interventions: Assess patient/caregiver ability to obtain necessary supplies Assess patient/caregiver ability to perform ulcer/skin care regimen upon admission and as needed Assess ulceration(s) every visit Provide education on ulcer and skin care Notes: Electronic Signature(s) Signed: 07/15/2021 5:10:09 PM By: Zandra Abts RN, BSN Entered By: Zandra Abts on 07/14/2021 17:55:35 -------------------------------------------------------------------------------- Pain Assessment Details Patient Name: Date of Service: [name], Allen HN L. 07/14/2021 9:00 A M Medical Record Number: [id number] Patient Account Number: [id number] Date of Birth/Sex: Treating RN: Mar 26, 1936 (86 y.o. [name] Primary Care Maryela Tapper: [name] Other Clinician: Referring Tinaya Ceballos: Treating Lamonte Hartt/Extender: Wilmer Floor Weeks in Treatment: 0 Active Problems Location of Pain Severity and Description of Pain Patient Has Paino No Site Locations Pain Management and Medication Current Pain Management: Electronic Signature(s) Signed: 07/15/2021 5:10:09 PM By: Zandra Abts RN, BSN Entered By: Zandra Abts on 07/14/2021 09:42:46 -------------------------------------------------------------------------------- Patient/Caregiver Education Details Patient Name: Date of Service: [name], Allen HN L. 1/17/2023andnbsp9:00 A M Medical Record Number: [id number] Patient Account Number: [id number] Date of Birth/Gender: Treating RN: 1936-02-28 (86 y.o. [name] Primary Care Physician: [name] Other Clinician: Referring Physician: Treating Physician/Extender: Anabel Bene in Treatment: 0 Education Assessment Education Provided To: Patient Education Topics Provided Venous: Methods: Explain/Verbal Responses: State content correctly Wound/Skin Impairment: Methods: Explain/Verbal Responses: State content correctly Electronic  Signature(s) Signed: 07/15/2021 5:10:09 PM By: Zandra Abts RN, BSN Entered By: Zandra Abts on 07/14/2021 17:55:45 -------------------------------------------------------------------------------- Wound Assessment Details Patient Name: Date of Service: [name], Allen HN L. 07/14/2021 9:00 A M Medical Record Number: [id number] Patient Account Number: [id number] Date of Birth/Sex: Treating RN: [birth year] (86 y.o. [name] Primary Care Kameka Whan: [name] Other Clinician: Referring Kaeya Schiffer: Treating Delshawn Stech/Extender: Wilmer Floor Weeks in Treatment: 0 Wound Status Wound Number: 1 Primary Etiology: Venous Leg Ulcer Wound Location: Right, Lateral Lower Leg Wound Status: Open Wounding Event: Blister Date Acquired: 03/28/2021 Weeks Of Treatment: 0 Clustered Wound: No Photos Wound Measurements Length: (cm) 6.5 Width: (cm) 7 Depth: (cm) 0.2 Area: (cm) 35.736 Volume: (cm) 7.147 % Reduction in Area: 0% % Reduction in Volume: 0% Epithelialization: None Tunneling: No Undermining: No Wound Description Classification: Full Thickness Without Exposed Support Structures Wound Margin: Flat and Intact Exudate Amount: Medium Exudate Type: Serosanguineous Exudate Color: red, brown Foul Odor After Cleansing: No Slough/Fibrino Yes Wound Bed Granulation Amount: Large (67-100%) Exposed Structure Granulation Quality: Red, Pink Fascia Exposed: No Necrotic Amount: Small (1-33%) Fat Layer (Subcutaneous Tissue) Exposed: Yes Necrotic Quality: Adherent Slough Tendon Exposed: No Muscle Exposed: No Joint Exposed: No Bone Exposed: No Treatment Notes Wound #1 (Lower Leg) Wound Laterality: Right, Lateral Cleanser Soap and Water Discharge Instruction: May shower and wash wound with dial antibacterial soap and water prior to dressing change. Wound Cleanser Discharge Instruction: Cleanse the wound with wound cleanser prior to applying a clean dressing using  gauze sponges, not tissue or cotton balls. Peri-Wound Care Triamcinolone 15 (g) Discharge Instruction: Use triamcinolone 15 (g) as directed Sween Lotion (Moisturizing lotion) Discharge Instruction: Apply moisturizing lotion as directed Topical Primary Dressing KerraCel Ag Gelling Fiber Dressing, 4x5 in (silver alginate) Discharge Instruction: Apply silver alginate to wound bed as instructed Secondary Dressing ABD Pad, 8x10 Discharge Instruction: Apply over primary dressing as directed. Zetuvit Plus 4x8 in Discharge Instruction: Apply over primary dressing as directed. Secured With Compression Wrap ThreePress (3 layer compression wrap) Discharge Instruction: Apply three layer compression as directed. Compression Stockings Add-Ons Electronic Signature(s) Signed: 07/15/2021  5:10:09 PM By: Zandra Abts RN, BSN Entered By: Zandra Abts on 07/14/2021 09:38:59 -------------------------------------------------------------------------------- Wound Assessment Details Patient Name: Date of Service: [name], Allen HN L. 07/14/2021 9:00 A M Medical Record Number: [id number] Patient Account Number: [id number] Date of Birth/Sex: Treating RN: 11/23/1935 (86 y.o. [name] Primary Care Libero Puthoff: [name] Other Clinician: Referring Chelci [place]: Treating Hymie Gorr/Extender: Wilmer Floor Weeks in Treatment: 0 Wound Status Wound Number: 2 Primary Etiology: Venous Leg Ulcer Wound Location: Right, Medial Lower Leg Wound Status: Open Wounding Event: Blister Date Acquired: 03/28/2021 Weeks Of Treatment: 0 Clustered Wound: No Photos Wound Measurements Length: (cm) 2.3 Width: (cm) 2 Depth: (cm) 0.1 Area: (cm) 3.613 Volume: (cm) 0.361 % Reduction in Area: 0% % Reduction in Volume: 0% Epithelialization: None Tunneling: No Undermining: No Wound Description Classification: Full Thickness Without Exposed Support Structures Wound Margin: Flat and  Intact Exudate Amount: Medium Exudate Type: Serosanguineous Exudate Color: red, brown Foul Odor After Cleansing: No Slough/Fibrino Yes Wound Bed Granulation Amount: Large (67-100%) Exposed Structure Granulation Quality: Red, Pink Fascia Exposed: No Necrotic Amount: Small (1-33%) Fat Layer (Subcutaneous Tissue) Exposed: Yes Necrotic Quality: Adherent Slough Tendon Exposed: No Muscle Exposed: No Joint Exposed: No Bone Exposed: No Treatment Notes Wound #2 (Lower Leg) Wound Laterality: Right, Medial Cleanser Soap and Water Discharge Instruction: May shower and wash wound with dial antibacterial soap and water prior to dressing change. Wound Cleanser Discharge Instruction: Cleanse the wound with wound cleanser prior to applying a clean dressing using gauze sponges, not tissue or cotton balls. Peri-Wound Care Triamcinolone 15 (g) Discharge Instruction: Use triamcinolone 15 (g) as directed Sween Lotion (Moisturizing lotion) Discharge Instruction: Apply moisturizing lotion as directed Topical Primary Dressing KerraCel Ag Gelling Fiber Dressing, 4x5 in (silver alginate) Discharge Instruction: Apply silver alginate to wound bed as instructed Secondary Dressing ABD Pad, 8x10 Discharge Instruction: Apply over primary dressing as directed. Zetuvit Plus 4x8 in Discharge Instruction: Apply over primary dressing as directed. Secured With Compression Wrap ThreePress (3 layer compression wrap) Discharge Instruction: Apply three layer compression as directed. Compression Stockings Add-Ons Electronic Signature(s) Signed: 07/15/2021 5:10:09 PM By: Zandra Abts RN, BSN Entered By: Zandra Abts on 07/14/2021 09:39:24 -------------------------------------------------------------------------------- Wound Assessment Details Patient Name: Date of Service: [name], Allen HN L. 07/14/2021 9:00 A M Medical Record Number: [id number] Patient Account Number: [id number] Date of  Birth/Sex: Treating RN: 1935-09-27 (86 y.o. [name] Primary Care Martesha Niedermeier: [name] Other Clinician: Referring Gahel Safley: Treating Lenix Benoist/Extender: Wilmer Floor Weeks in Treatment: 0 Wound Status Wound Number: 3 Primary Etiology: Venous Leg Ulcer Wound Location: Right, Posterior Lower Leg Wound Status: Open Wounding Event: Blister Date Acquired: 03/28/2021 Weeks Of Treatment: 0 Clustered Wound: No Photos Wound Measurements Length: (cm) 6 Width: (cm) 12.5 Depth: (cm) 0.1 Area: (cm) 58.905 Volume: (cm) 5.89 % Reduction in Area: 0% % Reduction in Volume: 0% Epithelialization: None Tunneling: No Undermining: No Wound Description Classification: Full Thickness Without Exposed Support Structures Wound Margin: Flat and Intact Exudate Amount: Large Exudate Type: Serosanguineous Exudate Color: red, brown Foul Odor After Cleansing: No Slough/Fibrino Yes Wound Bed Granulation Amount: Large (67-100%) Exposed Structure Granulation Quality: Red, Pink Fascia Exposed: No Necrotic Amount: Small (1-33%) Fat Layer (Subcutaneous Tissue) Exposed: Yes Necrotic Quality: Adherent Slough Tendon Exposed: No Muscle Exposed: No Joint Exposed: No Bone Exposed: No Treatment Notes Wound #3 (Lower Leg) Wound Laterality: Right, Posterior Cleanser Soap and Water Discharge Instruction: May shower and wash wound with dial antibacterial soap and water prior to dressing  change. Wound Cleanser Discharge Instruction: Cleanse the wound with wound cleanser prior to applying a clean dressing using gauze sponges, not tissue or cotton balls. Peri-Wound Care Triamcinolone 15 (g) Discharge Instruction: Use triamcinolone 15 (g) as directed Sween Lotion (Moisturizing lotion) Discharge Instruction: Apply moisturizing lotion as directed Topical Primary Dressing KerraCel Ag Gelling Fiber Dressing, 4x5 in (silver alginate) Discharge Instruction: Apply silver alginate  to wound bed as instructed Secondary Dressing ABD Pad, 8x10 Discharge Instruction: Apply over primary dressing as directed. Zetuvit Plus 4x8 in Discharge Instruction: Apply over primary dressing as directed. Secured With Compression Wrap ThreePress (3 layer compression wrap) Discharge Instruction: Apply three layer compression as directed. Compression Stockings Add-Ons Electronic Signature(s) Signed: 07/15/2021 5:10:09 PM By: Zandra AbtsLynch, Shatara RN, BSN Entered By: Zandra AbtsLynch, [name] on 07/14/2021 09:40:01 -------------------------------------------------------------------------------- Vitals Details Patient Name: Date of Service: [name], Allen HN L. 07/14/2021 9:00 A M Medical Record Number: [id number] Patient Account Number: [id number] Date of Birth/Sex: Treating RN: [birth year] (86 y.o. Allen KollerM) Buck, [name] Primary Care Azai Gaffin: Earlie LouGarba, Mohammad Other Clinician: Referring Malak Duchesneau: Treating Breionna Punt/Extender: [name], [name], Mohammad Weeks in Treatment: 0 Vital Signs Time Taken: 09:11 Temperature (F): 98.3 Height (in): 70 Pulse (bpm): 69 Source: Stated Respiratory Rate (breaths/min): 18 Weight (lbs): 195 Blood Pressure (mmHg): 166/81 Source: Stated Reference Range: 80 - 120 mg / dl Body Mass Index (BMI): 28 Electronic Signature(s) Signed: 07/15/2021 5:10:09 PM By: Zandra AbtsLynch, Shatara RN, BSN Entered By: Zandra AbtsLynch, [name] on 07/14/2021 09:14:33

## 2021-07-15 NOTE — Progress Notes (Signed)
[name], [name] ([id number]) Visit Report for 07/14/2021 Abuse/Suicide Risk Screen Details Patient Name: Date of Service: [name], [name] 07/14/2021 9:00 A M Medical Record Number: [id number] Patient Account Number: [id number] Date of Birth/Sex: Treating RN: 11/09/1935 (86 y.o. [name] Primary Care [name]: [name] Other Clinician: Referring [name]: Treating [name]/Extender: [name] in Treatment: 0 Abuse/Suicide Risk Screen Items Answer ABUSE RISK SCREEN: Has anyone close to you tried to hurt or harm you recentlyo No Do you feel uncomfortable with anyone in your familyo No Has anyone forced you do things that you didnt want to doo No Electronic Signature(s) Signed: 07/15/2021 5:10:09 PM By: Allen Hurst RN, BSN Entered By: [name] on 07/14/2021 09:57:43 -------------------------------------------------------------------------------- Activities of Daily Living Details Patient Name: Date of Service: [name], Allen HN L. 07/14/2021 9:00 A M Medical Record Number: [id number] Patient Account Number: [id number] Date of Birth/Sex: Treating RN: January 20, 1936 (86 y.o. [name] Primary Care [name]: [name] Other Clinician: Referring [name]: Treating [name]: [name] in Treatment: 0 Activities of Daily Living Items Answer Activities of Daily Living (Please select one for each item) Drive Automobile Completely Able T Medications ake Completely Able Use T elephone Completely Able Care for Appearance Completely Able Use T oilet Completely Able Bath / Shower Completely Able Dress Self Completely Able Feed Self Completely Able Walk Completely Able Get In / Out Bed Completely Able Housework Completely Able Prepare Meals Completely [place] for Self Completely Able Electronic Signature(s) Signed: 07/15/2021 5:10:09 PM By: Allen Hurst  RN, BSN Entered By: [name] on 07/14/2021 09:58:02 -------------------------------------------------------------------------------- Education Screening Details Patient Name: Date of Service: [name], [place] HN L. 07/14/2021 9:00 A M Medical Record Number: [id number] Patient Account Number: [id number] Date of Birth/Sex: Treating RN: [birth year] (86 y.o. [name] Primary Care [name]: [name] Other Clinician: Referring [name]: Treating [name]/Extender: [name] in Treatment: 0 Primary Learner Assessed: Patient Learning Preferences/Education Level/Primary Language Learning Preference: Explanation, Demonstration, Printed Material Highest Education Level: High School Preferred Language: English Cognitive Barrier Language Barrier: No Translator Needed: No Memory Deficit: No Emotional Barrier: No Cultural/Religious Beliefs Affecting Medical Care: No Physical Barrier Impaired Vision: No Impaired Hearing: No Decreased Hand dexterity: No Knowledge/Comprehension Knowledge Level: High Comprehension Level: High Ability to understand written instructions: High Ability to understand verbal instructions: High Motivation Anxiety Level: Calm Cooperation: Cooperative Education Importance: Acknowledges Need Interest in Health Problems: Asks Questions Perception: Coherent Willingness to Engage in Self-Management High Activities: Readiness to Engage in Self-Management High Activities: Electronic Signature(s) Signed: 07/15/2021 5:10:09 PM By: Allen Hurst RN, BSN Entered By: [name] on 07/14/2021 09:52:58 -------------------------------------------------------------------------------- Fall Risk Assessment Details Patient Name: Date of Service: [name], [name] 07/14/2021 9:00 A M Medical Record Number: [id number] Patient Account Number: [id number] Date of Birth/Sex: Treating RN: [birth year] (85 y.o. [name] Primary Care [name]: [name] Other Clinician: Referring [name]: Treating [name]/Extender: [name] in Treatment: 0 Fall Risk Assessment Items Have you had 2 or more falls in the last 12 monthso 0 No Have you had any fall that resulted in injury in the last 12 monthso 0 No FALLS RISK SCREEN History of falling - immediate or within 3 months 0 No Secondary diagnosis (Do you have 2 or more medical diagnoseso) 15 Yes Ambulatory aid None/bed rest/wheelchair/nurse 0 Yes Crutches/cane/walker 0 No Furniture 0 No Intravenous therapy Access/Saline/Heparin Lock 0  No Gait/Transferring Normal/ bed rest/ wheelchair 0 Yes Weak (short steps with or without shuffle, stooped but able to lift head while walking, may seek 0 No support from furniture) Impaired (short steps with shuffle, may have difficulty arising from chair, head down, impaired 0 No balance) Mental Status Oriented to own ability 0 Yes Electronic Signature(s) Signed: 07/15/2021 5:10:09 PM By: Allen Hurst RN, BSN Entered By: [name] on 07/14/2021 09:52:30 -------------------------------------------------------------------------------- Foot Assessment Details Patient Name: Date of Service: [name], [name] 07/14/2021 9:00 A M Medical Record Number: [id number] Patient Account Number: [id number] Date of Birth/Sex: Treating RN: 06/15/1936 (86 y.o. [name] Primary Care [name]: [name] Other Clinician: Referring [name]: Treating [name]/Extender: [name] Weeks in Treatment: 0 Foot Assessment Items Site Locations + = Sensation present, - = Sensation absent, C = Callus, U = Ulcer R = Redness, W = Warmth, M = Maceration, PU = Pre-ulcerative lesion F = Fissure, S = Swelling, D = Dryness Assessment Right: Left: Other Deformity: No No Prior Foot Ulcer: No No Prior Amputation: No No Charcot Joint: No No Ambulatory Status:  Ambulatory Without Help Gait: Steady Electronic Signature(s) Signed: 07/15/2021 5:10:09 PM By: Allen Hurst RN, BSN Entered By: [name] on 07/14/2021 09:43:25 -------------------------------------------------------------------------------- Nutrition Risk Screening Details Patient Name: Date of Service: [name], [place] HN L. 07/14/2021 9:00 A M Medical Record Number: [id number] Patient Account Number: [id number] Date of Birth/Sex: Treating RN: [birth year] (86 y.o. [name] Primary Care [name]: [name] Other Clinician: Referring [name]: Treating [name]/Extender: [name] Weeks in Treatment: 0 Height (in): 70 Weight (lbs): 195 Body Mass Index (BMI): 28 Nutrition Risk Screening Items Score Screening NUTRITION RISK SCREEN: I have an illness or condition that made me change the kind and/or amount of food I eat 0 No I eat fewer than two meals per day 0 No I eat few fruits and vegetables, or milk products 0 No I have three or more drinks of beer, liquor or wine almost every day 0 No I have tooth or mouth problems that make it hard for me to eat 0 No I don't always have enough money to buy the food I need 0 No I eat alone most of the time 0 No I take three or more different prescribed or over-the-counter drugs a day 1 Yes Without wanting to, I have lost or gained 10 pounds in the last six months 0 No I am not always physically able to shop, cook and/or feed myself 2 Yes Nutrition Protocols Good Risk Protocol Moderate Risk Protocol 0 Provide education on nutrition High Risk Proctocol Risk Level: Moderate Risk Score: 3 Electronic Signature(s) Signed: 07/15/2021 5:10:09 PM By: Allen Hurst RN, BSN Entered By: [name] on 07/14/2021 09:43:04

## 2021-07-21 ENCOUNTER — Encounter (HOSPITAL_BASED_OUTPATIENT_CLINIC_OR_DEPARTMENT_OTHER): Payer: Medicare Other | Admitting: Internal Medicine

## 2021-07-21 ENCOUNTER — Other Ambulatory Visit: Payer: Self-pay

## 2021-07-21 DIAGNOSIS — I87331 Chronic venous hypertension (idiopathic) with ulcer and inflammation of right lower extremity: Secondary | ICD-10-CM | POA: Diagnosis not present

## 2021-07-21 NOTE — Progress Notes (Signed)
SHINE, MIKES ([id number]) Visit Report for 07/21/2021 HPI Details Patient Name: Date of Service: [name], JO [place] L. 07/21/2021 9:30 A M Medical Record Number: [id number] Patient Account Number: [id number] Date of Birth/Sex: Treating RN: [birth year] (86 y.o. [name] Primary Care Provider: [name] Other Clinician: Referring Provider: Treating Provider/Extender: Anabel Bene in Treatment: 1 History of Present Illness HPI Description: ADMISSION 07/14/2021 This is a pleasant 86 year old man who arrived in clinic today accompanied by his granddaughter. He has had 3 wounds on the right leg over the last 2 months or so. Initially started as blisters that opened and increasing edema in the right leg. He has 3 open areas 1 right lateral 1 right medial and 1 right posterior. He was in the ER on 06/30/2021 and had topical bacitracin to apply to the wound areas. Was seen 2 days later by his primary doctor in addition to the doxycycline that he was given by the ER he was put on Augmentin. He is finished both of these antibiotics. He has worn compression stockings or may be just support stockings until very recently although they were about a year old, his son got them at [facility]. Past medical history includes gallstone pancreatitis, hypertension, hyperlipidemia, type 2 diabetes with last hemoglobin A1c in epic 3 years ago, coronary artery disease. He is still a cigarette smoker. ABI in our clinic was 0.72 on the right although this may have been hampered by the amount of edema in the leg 1/24; patient's wounds look a lot alert. He has edema can roll in the right leg with 3 layer compression. He also has severe venous insufficiency. His ABIs in our clinic were 0.72, I may check this next week when we have better edema can we do not yet have an appointment for arterial Dopplers Electronic Signature(s) Signed: 07/21/2021 4:35:26 PM By: Baltazar Najjar MD Entered By:  Baltazar Najjar on 07/21/2021 11:49:57 -------------------------------------------------------------------------------- Physical Exam Details Patient Name: Date of Service: [name], [name] 07/21/2021 9:30 A M Medical Record Number: [id number] Patient Account Number: [id number] Date of Birth/Sex: Treating RN: Dec 14, 1935 (86 y.o. [name] Primary Care Provider: [name] Other Clinician: Referring Provider: Treating Provider/Extender: Anabel Bene in Treatment: 1 Constitutional Patient is hypertensive.. Pulse regular and within target range for patient.Marland Kitchen Respirations regular, non-labored and within target range.. Temperature is normal and within the target range for the patient.Marland Kitchen Appears in no distress. Cardiovascular . Notes Wound exam; 3 wounds right lateral right medial and right posterior. Nice clean surfaces versus the eschar he came in with. No debridement is necessary. We have changes of severe venous hypertension. His edema control is better. No evidence of infection pedal pulses are still difficult to feel Electronic Signature(s) Signed: 07/21/2021 4:35:26 PM By: Baltazar Najjar MD Entered By: Baltazar Najjar on 07/21/2021 11:53:41 -------------------------------------------------------------------------------- Physician Orders Details Patient Name: Date of Service: [name], [name] 07/21/2021 9:30 A M Medical Record Number: [id number] Patient Account Number: [id number] Date of Birth/Sex: Treating RN: September 02, 1935 (86 y.o. Lytle Michaels Primary Care Provider: [name] Other Clinician: Referring Provider: Treating Provider/Extender: Anabel Bene in Treatment: 1 Verbal / Phone Orders: No Diagnosis Coding ICD-10 Coding Code Description [phone] Non-pressure chronic ulcer of other part of right lower leg with other specified severity I87.331 Chronic venous hypertension (idiopathic) with ulcer and  inflammation of right lower extremity I89.0 Lymphedema, not elsewhere classified E11.622 Type 2 diabetes mellitus with other  skin ulcer Follow-up Appointments ppointment in 1 week. - Dr. Leanord Hawking Return A Bathing/ Shower/ Hygiene May shower with protection but do not get wound dressing(s) wet. - Ok to use Arts development officer, can purchase at [facility], [facility], or [employer] Edema Control - Lymphedema / SCD / Other Elevate legs to the level of the heart or above for 30 minutes daily and/or when sitting, a frequency of: - throughout the day Avoid standing for long periods of time. Exercise regularly Moisturize legs daily. - left leg daily Home Health dmit to Home Health for wound care. May utilize formulary equivalent dressing for wound treatment orders unless otherwise specified. - A Skilled nursing for wound care once a week Other Home Health Orders/Instructions: - [facility] Wound Treatment Wound #1 - Lower Leg Wound Laterality: Right, Lateral Cleanser: Soap and Water 1 x Per Week/7 Days Discharge Instructions: May shower and wash wound with dial antibacterial soap and water prior to dressing change. Cleanser: Wound Cleanser 1 x Per Week/7 Days Discharge Instructions: Cleanse the wound with wound cleanser prior to applying a clean dressing using gauze sponges, not tissue or cotton balls. Peri-Wound Care: Triamcinolone 15 (g) 1 x Per Week/7 Days Discharge Instructions: Use triamcinolone 15 (g) as directed Peri-Wound Care: Sween Lotion (Moisturizing lotion) 1 x Per Week/7 Days Discharge Instructions: Apply moisturizing lotion as directed Prim Dressing: KerraCel Ag Gelling Fiber Dressing, 4x5 in (silver alginate) 1 x Per Week/7 Days ary Discharge Instructions: Apply silver alginate to wound bed as instructed Secondary Dressing: ABD Pad, 8x10 1 x Per Week/7 Days Discharge Instructions: Apply over primary dressing as directed. Secondary Dressing: Zetuvit Plus 4x8 in 1 x Per Week/7 Days Discharge  Instructions: Apply over primary dressing as directed. Compression Wrap: ThreePress (3 layer compression wrap) 1 x Per Week/7 Days Discharge Instructions: Apply three layer compression as directed. Wound #2 - Lower Leg Wound Laterality: Right, Medial Cleanser: Soap and Water 1 x Per Week/7 Days Discharge Instructions: May shower and wash wound with dial antibacterial soap and water prior to dressing change. Cleanser: Wound Cleanser 1 x Per Week/7 Days Discharge Instructions: Cleanse the wound with wound cleanser prior to applying a clean dressing using gauze sponges, not tissue or cotton balls. Peri-Wound Care: Triamcinolone 15 (g) 1 x Per Week/7 Days Discharge Instructions: Use triamcinolone 15 (g) as directed Peri-Wound Care: Sween Lotion (Moisturizing lotion) 1 x Per Week/7 Days Discharge Instructions: Apply moisturizing lotion as directed Prim Dressing: KerraCel Ag Gelling Fiber Dressing, 4x5 in (silver alginate) 1 x Per Week/7 Days ary Discharge Instructions: Apply silver alginate to wound bed as instructed Secondary Dressing: ABD Pad, 8x10 1 x Per Week/7 Days Discharge Instructions: Apply over primary dressing as directed. Secondary Dressing: Zetuvit Plus 4x8 in 1 x Per Week/7 Days Discharge Instructions: Apply over primary dressing as directed. Compression Wrap: ThreePress (3 layer compression wrap) 1 x Per Week/7 Days Discharge Instructions: Apply three layer compression as directed. Wound #3 - Lower Leg Wound Laterality: Right, Posterior Cleanser: Soap and Water 1 x Per Week/7 Days Discharge Instructions: May shower and wash wound with dial antibacterial soap and water prior to dressing change. Cleanser: Wound Cleanser 1 x Per Week/7 Days Discharge Instructions: Cleanse the wound with wound cleanser prior to applying a clean dressing using gauze sponges, not tissue or cotton balls. Peri-Wound Care: Triamcinolone 15 (g) 1 x Per Week/7 Days Discharge Instructions: Use triamcinolone  15 (g) as directed Peri-Wound Care: Sween Lotion (Moisturizing lotion) 1 x Per Week/7 Days Discharge Instructions: Apply moisturizing lotion as  directed Prim Dressing: KerraCel Ag Gelling Fiber Dressing, 4x5 in (silver alginate) 1 x Per Week/7 Days ary Discharge Instructions: Apply silver alginate to wound bed as instructed Secondary Dressing: ABD Pad, 8x10 1 x Per Week/7 Days Discharge Instructions: Apply over primary dressing as directed. Secondary Dressing: Zetuvit Plus 4x8 in 1 x Per Week/7 Days Discharge Instructions: Apply over primary dressing as directed. Compression Wrap: ThreePress (3 layer compression wrap) 1 x Per Week/7 Days Discharge Instructions: Apply three layer compression as directed. Electronic Signature(s) Signed: 07/21/2021 4:23:30 PM By: Antonieta Iba Signed: 07/21/2021 4:35:26 PM By: Baltazar Najjar MD Entered By: Antonieta Iba on 07/21/2021 10:29:51 -------------------------------------------------------------------------------- Problem List Details Patient Name: Date of Service: [name], [name] 07/21/2021 9:30 A M Medical Record Number: [id number] Patient Account Number: [id number] Date of Birth/Sex: Treating RN: 1936/06/16 (86 y.o. Lytle Michaels Primary Care Provider: [name] Other Clinician: Referring Provider: Treating Provider/Extender: Anabel Bene in Treatment: 1 Active Problems ICD-10 Encounter Code Description Active Date MDM Diagnosis L97.818 Non-pressure chronic ulcer of other part of right lower leg with other specified 07/14/2021 No Yes severity I87.331 Chronic venous hypertension (idiopathic) with ulcer and inflammation of right 07/14/2021 No Yes lower extremity I89.0 Lymphedema, not elsewhere classified 07/14/2021 No Yes E11.622 Type 2 diabetes mellitus with other skin ulcer 07/14/2021 No Yes Inactive Problems Resolved Problems Electronic Signature(s) Signed: 07/21/2021 4:35:26 PM By: Baltazar Najjar MD Previous Signature: 07/21/2021 10:04:12 AM Version By: Antonieta Iba Entered By: Baltazar Najjar on 07/21/2021 11:39:38 -------------------------------------------------------------------------------- Progress Note Details Patient Name: Date of Service: [name], [name] 07/21/2021 9:30 A M Medical Record Number: [id number] Patient Account Number: [id number] Date of Birth/Sex: Treating RN: Feb 22, 1936 (86 y.o. [name] Primary Care Provider: [name] Other Clinician: Referring Provider: Treating Provider/Extender: Anabel Bene in Treatment: 1 Subjective History of Present Illness (HPI) ADMISSION 07/14/2021 This is a pleasant 86 year old man who arrived in clinic today accompanied by his granddaughter. He has had 3 wounds on the right leg over the last 2 months or so. Initially started as blisters that opened and increasing edema in the right leg. He has 3 open areas 1 right lateral 1 right medial and 1 right posterior. He was in the ER on 06/30/2021 and had topical bacitracin to apply to the wound areas. Was seen 2 days later by his primary doctor in addition to the doxycycline that he was given by the ER he was put on Augmentin. He is finished both of these antibiotics. He has worn compression stockings or may be just support stockings until very recently although they were about a year old, his son got them at [facility]. Past medical history includes gallstone pancreatitis, hypertension, hyperlipidemia, type 2 diabetes with last hemoglobin A1c in epic 3 years ago, coronary artery disease. He is still a cigarette smoker. ABI in our clinic was 0.72 on the right although this may have been hampered by the amount of edema in the leg 1/24; patient's wounds look a lot alert. He has edema can roll in the right leg with 3 layer compression. He also has severe venous insufficiency. His ABIs in our clinic were 0.72, I may check this next week when we  have better edema can we do not yet have an appointment for arterial Dopplers Objective Constitutional Patient is hypertensive.. Pulse regular and within target range for patient.Marland Kitchen Respirations regular, non-labored and within target range.. Temperature is normal and within the target range for the  patient.Marland Kitchen. Appears in no distress. Vitals Time Taken: 9:39 AM, Height: 70 in, Weight: 195 lbs, BMI: 28, Temperature: 98.7 F, Pulse: 65 bpm, Respiratory Rate: 18 breaths/min, Blood Pressure: 188/83 mmHg. General Notes: Wound exam; 3 wounds right lateral right medial and right posterior. Nice clean surfaces versus the eschar he came in with. No debridement is necessary. We have changes of severe venous hypertension. His edema control is better. No evidence of infection pedal pulses are still difficult to feel Integumentary (Hair, Skin) Wound #1 status is Open. Original cause of wound was Blister. The date acquired was: 03/28/2021. The wound has been in treatment 1 weeks. The wound is located on the Right,Lateral Lower Leg. The wound measures 5.9cm length x 6cm width x 0.1cm depth; 27.803cm^2 area and 2.78cm^3 volume. There is Fat Layer (Subcutaneous Tissue) exposed. There is no tunneling or undermining noted. There is a medium amount of serosanguineous drainage noted. The wound margin is distinct with the outline attached to the wound base. There is large (67-100%) red, pink granulation within the wound bed. There is a small (1-33%) amount of necrotic tissue within the wound bed including Adherent Slough. Wound #2 status is Open. Original cause of wound was Blister. The date acquired was: 03/28/2021. The wound has been in treatment 1 weeks. The wound is located on the Right,Medial Lower Leg. The wound measures 0.5cm length x 0.3cm width x 0.1cm depth; 0.118cm^2 area and 0.012cm^3 volume. There is Fat Layer (Subcutaneous Tissue) exposed. There is no tunneling or undermining noted. There is a medium amount of  serosanguineous drainage noted. The wound margin is distinct with the outline attached to the wound base. There is large (67-100%) red, pink granulation within the wound bed. There is a small (1-33%) amount of necrotic tissue within the wound bed including Adherent Slough. Wound #3 status is Open. Original cause of wound was Blister. The date acquired was: 03/28/2021. The wound has been in treatment 1 weeks. The wound is located on the Right,Posterior Lower Leg. The wound measures 7.5cm length x 10.1cm width x 0.1cm depth; 59.494cm^2 area and 5.949cm^3 volume. There is Fat Layer (Subcutaneous Tissue) exposed. There is no tunneling or undermining noted. There is a large amount of serosanguineous drainage noted. The wound margin is distinct with the outline attached to the wound base. There is large (67-100%) red, pink granulation within the wound bed. There is a small (1-33%) amount of necrotic tissue within the wound bed including Adherent Slough. Assessment Active Problems ICD-10 Non-pressure chronic ulcer of other part of right lower leg with other specified severity Chronic venous hypertension (idiopathic) with ulcer and inflammation of right lower extremity Lymphedema, not elsewhere classified Type 2 diabetes mellitus with other skin ulcer Procedures Wound #1 Pre-procedure diagnosis of Wound #1 is a Venous Leg Ulcer located on the Right,Lateral Lower Leg . There was a Three Layer Compression Therapy Procedure by Antonieta IbaBarnhart, Jodi, RN. Post procedure Diagnosis Wound #1: Same as Pre-Procedure Wound #2 Pre-procedure diagnosis of Wound #2 is a Venous Leg Ulcer located on the Right,Medial Lower Leg . There was a Three Layer Compression Therapy Procedure by Antonieta IbaBarnhart, Jodi, RN. Post procedure Diagnosis Wound #2: Same as Pre-Procedure Wound #3 Pre-procedure diagnosis of Wound #3 is a Venous Leg Ulcer located on the Right,Posterior Lower Leg . There was a Three Layer Compression Therapy Procedure by  Antonieta IbaBarnhart, Jodi, RN. Post procedure Diagnosis Wound #3: Same as Pre-Procedure Plan Follow-up Appointments: Return Appointment in 1 week. - Dr. Chauncey Mannobson Bathing/ Shower/ Hygiene: May shower with  protection but do not get wound dressing(s) wet. - Ok to use Arts development officercast protector, can purchase at [facility], [facility], or [employer] Edema Control - Lymphedema / SCD / Other: Elevate legs to the level of the heart or above for 30 minutes daily and/or when sitting, a frequency of: - throughout the day Avoid standing for long periods of time. Exercise regularly Moisturize legs daily. - left leg daily Home Health: Admit to Home Health for wound care. May utilize formulary equivalent dressing for wound treatment orders unless otherwise specified. - Skilled nursing for wound care once a week Other Home Health Orders/Instructions: - [facility] WOUND #1: - Lower Leg Wound Laterality: Right, Lateral Cleanser: Soap and Water 1 x Per Week/7 Days Discharge Instructions: May shower and wash wound with dial antibacterial soap and water prior to dressing change. Cleanser: Wound Cleanser 1 x Per Week/7 Days Discharge Instructions: Cleanse the wound with wound cleanser prior to applying a clean dressing using gauze sponges, not tissue or cotton balls. Peri-Wound Care: Triamcinolone 15 (g) 1 x Per Week/7 Days Discharge Instructions: Use triamcinolone 15 (g) as directed Peri-Wound Care: Sween Lotion (Moisturizing lotion) 1 x Per Week/7 Days Discharge Instructions: Apply moisturizing lotion as directed Prim Dressing: KerraCel Ag Gelling Fiber Dressing, 4x5 in (silver alginate) 1 x Per Week/7 Days ary Discharge Instructions: Apply silver alginate to wound bed as instructed Secondary Dressing: ABD Pad, 8x10 1 x Per Week/7 Days Discharge Instructions: Apply over primary dressing as directed. Secondary Dressing: Zetuvit Plus 4x8 in 1 x Per Week/7 Days Discharge Instructions: Apply over primary dressing as directed. Com pression  Wrap: ThreePress (3 layer compression wrap) 1 x Per Week/7 Days Discharge Instructions: Apply three layer compression as directed. WOUND #2: - Lower Leg Wound Laterality: Right, Medial Cleanser: Soap and Water 1 x Per Week/7 Days Discharge Instructions: May shower and wash wound with dial antibacterial soap and water prior to dressing change. Cleanser: Wound Cleanser 1 x Per Week/7 Days Discharge Instructions: Cleanse the wound with wound cleanser prior to applying a clean dressing using gauze sponges, not tissue or cotton balls. Peri-Wound Care: Triamcinolone 15 (g) 1 x Per Week/7 Days Discharge Instructions: Use triamcinolone 15 (g) as directed Peri-Wound Care: Sween Lotion (Moisturizing lotion) 1 x Per Week/7 Days Discharge Instructions: Apply moisturizing lotion as directed Prim Dressing: KerraCel Ag Gelling Fiber Dressing, 4x5 in (silver alginate) 1 x Per Week/7 Days ary Discharge Instructions: Apply silver alginate to wound bed as instructed Secondary Dressing: ABD Pad, 8x10 1 x Per Week/7 Days Discharge Instructions: Apply over primary dressing as directed. Secondary Dressing: Zetuvit Plus 4x8 in 1 x Per Week/7 Days Discharge Instructions: Apply over primary dressing as directed. Com pression Wrap: ThreePress (3 layer compression wrap) 1 x Per Week/7 Days Discharge Instructions: Apply three layer compression as directed. WOUND #3: - Lower Leg Wound Laterality: Right, Posterior Cleanser: Soap and Water 1 x Per Week/7 Days Discharge Instructions: May shower and wash wound with dial antibacterial soap and water prior to dressing change. Cleanser: Wound Cleanser 1 x Per Week/7 Days Discharge Instructions: Cleanse the wound with wound cleanser prior to applying a clean dressing using gauze sponges, not tissue or cotton balls. Peri-Wound Care: Triamcinolone 15 (g) 1 x Per Week/7 Days Discharge Instructions: Use triamcinolone 15 (g) as directed Peri-Wound Care: Sween Lotion (Moisturizing  lotion) 1 x Per Week/7 Days Discharge Instructions: Apply moisturizing lotion as directed Prim Dressing: KerraCel Ag Gelling Fiber Dressing, 4x5 in (silver alginate) 1 x Per Week/7 Days ary Discharge Instructions:  Apply silver alginate to wound bed as instructed Secondary Dressing: ABD Pad, 8x10 1 x Per Week/7 Days Discharge Instructions: Apply over primary dressing as directed. Secondary Dressing: Zetuvit Plus 4x8 in 1 x Per Week/7 Days Discharge Instructions: Apply over primary dressing as directed. Com pression Wrap: ThreePress (3 layer compression wrap) 1 x Per Week/7 Days Discharge Instructions: Apply three layer compression as directed. 1. We will continue with Aquacel Ag. 2. Liberal TCA and moisturizer 3. ABDs still under 3 layer compression. 4. I would like to see his arterial studies to determine adequacy of blood flow to his foot before considering increasing his compression and further. If we cannot get these wounds to close then I would look at venous reflux studies Electronic Signature(s) Signed: 07/21/2021 4:35:26 PM By: Baltazar Najjar MD Entered By: Baltazar Najjar on 07/21/2021 11:55:33 -------------------------------------------------------------------------------- SuperBill Details Patient Name: Date of Service: [name], [name] 07/21/2021 Medical Record Number: [id number] Patient Account Number: [id number] Date of Birth/Sex: Treating RN: Apr 04, 1936 (87 y.o. Lytle Michaels Primary Care Provider: [name] Other Clinician: Referring Provider: Treating Provider/Extender: Wilmer Floor Weeks in Treatment: 1 Diagnosis Coding ICD-10 Codes Code Description [phone] Non-pressure chronic ulcer of other part of right lower leg with other specified severity I87.331 Chronic venous hypertension (idiopathic) with ulcer and inflammation of right lower extremity I89.0 Lymphedema, not elsewhere classified E11.622 Type 2 diabetes mellitus with other  skin ulcer Facility Procedures CPT4 Code: [id number] Description: (Facility Use Only) [id number] - APPLY MULTLAY COMPRS LWR RT LEG ICD-10 Diagnosis Description L97.818 Non-pressure chronic ulcer of other part of right lower leg with other specified sever Modifier: ity Quantity: 1 Physician Procedures : CPT4 Code Description Modifier [id number] - WC PHYS LEVEL 3 - EST PT ICD-10 Diagnosis Description L97.818 Non-pressure chronic ulcer of other part of right lower leg with other specified severity I87.331 Chronic venous hypertension (idiopathic) with  ulcer and inflammation of right lower extremity I89.0 Lymphedema, not elsewhere classified E11.622 Type 2 diabetes mellitus with other skin ulcer Quantity: 1 Electronic Signature(s) Signed: 07/21/2021 4:35:26 PM By: Baltazar Najjar MD Entered By: Baltazar Najjar on 07/21/2021 11:56:22

## 2021-07-23 NOTE — Progress Notes (Signed)
[name], [name] ([id number]) Visit Report for 07/21/2021 Arrival Information Details Patient Name: Date of Service: CO PPA [name]. 07/21/2021 9:30 A M Medical Record Number: [id number] Patient Account Number: [id number] Date of Birth/Sex: Treating RN: 10/18/1935 (86 y.o. [name], [name] Primary Care [name]: [name] Other Clinician: Referring [name]: Treating [name]/Extender: [name] in Treatment: 1 Visit Information History Since Last Visit Added or deleted any medications: No Patient Arrived: Ambulatory Any new allergies or adverse reactions: No Arrival Time: 09:39 Had a fall or experienced change in No Accompanied By: family activities of daily living that may affect Transfer Assistance: None risk of falls: Patient Identification Verified: Yes Signs or symptoms of abuse/neglect since last visito No Secondary Verification Process Completed: Yes Hospitalized since last visit: No Patient Requires Transmission-Based Precautions: No Implantable device outside of the clinic excluding No Patient Has Alerts: No cellular tissue based products placed in the center since last visit: Has Dressing in Place as Prescribed: Yes Pain Present Now: Yes Electronic Signature(s) Signed: 07/23/2021 5:37:29 PM By: Allen Hammock RN Entered By: [name] on 07/21/2021 09:39:37 -------------------------------------------------------------------------------- Compression Therapy Details Patient Name: Date of Service: [name], [name] 07/21/2021 9:30 A M Medical Record Number: [id number] Patient Account Number: [id number] Date of Birth/Sex: Treating RN: 1936-02-17 (86 y.o. [name] Primary Care [name]: [name] Other Clinician: Referring [name]: Treating [name]/Extender: [name] in Treatment: 1 Compression Therapy Performed for Wound Assessment: Wound #1 Right,Lateral Lower  Leg Performed By: Clinician Allen Jackson, RN Compression Type: Three Layer Post Procedure Diagnosis Same as Pre-procedure Electronic Signature(s) Signed: 07/21/2021 4:23:30 PM By: [name] Entered By: [name] on 07/21/2021 10:28:41 -------------------------------------------------------------------------------- Compression Therapy Details Patient Name: Date of Service: [name], [name] 07/21/2021 9:30 A M Medical Record Number: [id number] Patient Account Number: [id number] Date of Birth/Sex: Treating RN: [birth year] (86 y.o. [name] Primary Care [name]: [name] Other Clinician: Referring Lounette Sloan: Treating [name]/Extender: [name] in Treatment: 1 Compression Therapy Performed for Wound Assessment: Wound #2 Right,Medial Lower Leg Performed By: Clinician Allen Jackson, RN Compression Type: Three Layer Post Procedure Diagnosis Same as Pre-procedure Electronic Signature(s) Signed: 07/21/2021 4:23:30 PM By: [name] Entered By: [name] on 07/21/2021 10:28:41 -------------------------------------------------------------------------------- Compression Therapy Details Patient Name: Date of Service: [name], [name] 07/21/2021 9:30 A M Medical Record Number: [id number] Patient Account Number: [id number] Date of Birth/Sex: Treating RN: May 09, 1936 (86 y.o. [name] Primary Care [name]: [name] Other Clinician: Referring Brysten Reister: Treating [name]/Extender: [name] in Treatment: 1 Compression Therapy Performed for Wound Assessment: Wound #3 Right,Posterior Lower Leg Performed By: Clinician Allen Jackson, RN Compression Type: Three Layer Post Procedure Diagnosis Same as Pre-procedure Electronic Signature(s) Signed: 07/21/2021 4:23:30 PM By: [name] Entered By: [name] on 07/21/2021  10:28:41 -------------------------------------------------------------------------------- Encounter Discharge Information Details Patient Name: Date of Service: [name], [place] HN L. 07/21/2021 9:30 A M Medical Record Number: [id number] Patient Account Number: [id number] Date of Birth/Sex: Treating RN: 06/18/1936 (86 y.o. [name] Primary Care [name]: [name] Other Clinician: Referring [name]: Treating [name]/Extender: [name] in Treatment: 1 Encounter Discharge Information Items Discharge Condition: Stable Ambulatory Status: Ambulatory Discharge Destination: Home Transportation: Private Auto Accompanied By: Daughter Schedule Follow-up Appointment: Yes Clinical Summary of Care: Provided on 07/21/2021 Form Type Recipient Paper Patient Patient Electronic Signature(s) Signed: 07/21/2021 11:13:25 AM By: [name] Entered By: Allen Boer  Buck on 07/21/2021 11:13:25 -------------------------------------------------------------------------------- Lower Extremity Assessment Details Patient Name: Date of Service: [name], [name] 07/21/2021 9:30 A M Medical Record Number: [id number] Patient Account Number: [id number] Date of Birth/Sex: Treating RN: Jun 28, 1936 (86 y.o. [name] Primary Care [name]: [name] Other Clinician: Referring [name]: Treating [name]/Extender: [name] Weeks in Treatment: 1 Edema Assessment Assessed: [name]: No] [Right: Yes] Edema: [Left: Ye] [Right: s] Calf Left: Right: Point of Measurement: 33 cm From Medial Instep 41.4 cm Ankle Left: Right: Point of Measurement: 12 cm From Medial Instep 28 cm Vascular Assessment Pulses: Dorsalis Pedis Palpable: [Right:Yes] Electronic Signature(s) Signed: 07/21/2021 4:23:30 PM By: [name] Entered By: [name] on 07/21/2021  09:58:45 -------------------------------------------------------------------------------- Multi Wound Chart Details Patient Name: Date of Service: [name], [name] 07/21/2021 9:30 A M Medical Record Number: [id number] Patient Account Number: [id number] Date of Birth/Sex: Treating RN: 10-14-1935 (86 y.o. [name] Primary Care [name]: [name] Other Clinician: Referring [name]: Treating [name]/Extender: [name] Weeks in Treatment: 1 Vital Signs Height(in): 70 Pulse(bpm): 65 Weight(lbs): 195 Blood Pressure(mmHg): 188/83 Body Mass Index(BMI): 28 Temperature(F): 98.7 Respiratory Rate(breaths/min): 18 Photos: Right, Lateral Lower Leg Right, Medial Lower Leg Right, Posterior Lower Leg Wound Location: Blister Blister Blister Wounding Event: Venous Leg Ulcer Venous Leg Ulcer Venous Leg Ulcer Primary Etiology: Coronary Artery Disease, Coronary Artery Disease, Coronary Artery Disease, Comorbid History: Hypertension, Myocardial Infarction, Hypertension, Myocardial Infarction, Hypertension, Myocardial Infarction, Peripheral Venous Disease, Type II Peripheral Venous Disease, Type II Peripheral Venous Disease, Type II Diabetes Diabetes Diabetes 03/28/2021 03/28/2021 03/28/2021 Date Acquired: 1 1 1  Weeks of Treatment: Open Open Open Wound Status: No No No Wound Recurrence: 5.9x6x0.1 0.5x0.3x0.1 7.5x10.1x0.1 Measurements L x W x D (cm) 27.803 0.118 59.494 A (cm) : rea 2.78 0.012 5.949 Volume (cm) : 22.20% 96.70% -1.00% % Reduction in Area: 61.10% 96.70% -1.00% % Reduction in Volume: Full Thickness Without Exposed Full Thickness Without Exposed Full Thickness Without Exposed Classification: Support Structures Support Structures Support Structures Medium Medium Large Exudate Amount: Serosanguineous Serosanguineous Serosanguineous Exudate Type: red, brown red, brown red, brown Exudate Color: Distinct, outline attached  Distinct, outline attached Distinct, outline attached Wound Margin: Large (67-100%) Large (67-100%) Large (67-100%) Granulation Amount: Red, Pink Red, Pink Red, Pink Granulation Quality: Small (1-33%) Small (1-33%) Small (1-33%) Necrotic Amount: Fat Layer (Subcutaneous Tissue): Yes Fat Layer (Subcutaneous Tissue): Yes Fat Layer (Subcutaneous Tissue): Yes Exposed Structures: Fascia: No Fascia: No Fascia: No Tendon: No Tendon: No Tendon: No Muscle: No Muscle: No Muscle: No Joint: No Joint: No Joint: No Bone: No Bone: No Bone: No None Large (67-100%) None Epithelialization: Compression Therapy Compression Therapy Compression Therapy Procedures Performed: Treatment Notes Wound #1 (Lower Leg) Wound Laterality: Right, Lateral Cleanser Soap and Water Discharge Instruction: May shower and wash wound with dial antibacterial soap and water prior to dressing change. Wound Cleanser Discharge Instruction: Cleanse the wound with wound cleanser prior to applying a clean dressing using gauze sponges, not tissue or cotton balls. Peri-Wound Care Triamcinolone 15 (g) Discharge Instruction: Use triamcinolone 15 (g) as directed Sween Lotion (Moisturizing lotion) Discharge Instruction: Apply moisturizing lotion as directed Topical Primary Dressing KerraCel Ag Gelling Fiber Dressing, 4x5 in (silver alginate) Discharge Instruction: Apply silver alginate to wound bed as instructed Secondary Dressing ABD Pad, 8x10 Discharge Instruction: Apply over primary dressing as directed. Zetuvit Plus 4x8 in Discharge Instruction: Apply over primary dressing as directed. Secured With Compression Wrap ThreePress (3 layer compression wrap) Discharge Instruction: Apply  three layer compression as directed. Compression Stockings Add-Ons Wound #2 (Lower Leg) Wound Laterality: Right, Medial Cleanser Soap and Water Discharge Instruction: May shower and wash wound with dial antibacterial soap and water  prior to dressing change. Wound Cleanser Discharge Instruction: Cleanse the wound with wound cleanser prior to applying a clean dressing using gauze sponges, not tissue or cotton balls. Peri-Wound Care Triamcinolone 15 (g) Discharge Instruction: Use triamcinolone 15 (g) as directed Sween Lotion (Moisturizing lotion) Discharge Instruction: Apply moisturizing lotion as directed Topical Primary Dressing KerraCel Ag Gelling Fiber Dressing, 4x5 in (silver alginate) Discharge Instruction: Apply silver alginate to wound bed as instructed Secondary Dressing ABD Pad, 8x10 Discharge Instruction: Apply over primary dressing as directed. Zetuvit Plus 4x8 in Discharge Instruction: Apply over primary dressing as directed. Secured With Compression Wrap ThreePress (3 layer compression wrap) Discharge Instruction: Apply three layer compression as directed. Compression Stockings Add-Ons Wound #3 (Lower Leg) Wound Laterality: Right, Posterior Cleanser Soap and Water Discharge Instruction: May shower and wash wound with dial antibacterial soap and water prior to dressing change. Wound Cleanser Discharge Instruction: Cleanse the wound with wound cleanser prior to applying a clean dressing using gauze sponges, not tissue or cotton balls. Peri-Wound Care Triamcinolone 15 (g) Discharge Instruction: Use triamcinolone 15 (g) as directed Sween Lotion (Moisturizing lotion) Discharge Instruction: Apply moisturizing lotion as directed Topical Primary Dressing KerraCel Ag Gelling Fiber Dressing, 4x5 in (silver alginate) Discharge Instruction: Apply silver alginate to wound bed as instructed Secondary Dressing ABD Pad, 8x10 Discharge Instruction: Apply over primary dressing as directed. Zetuvit Plus 4x8 in Discharge Instruction: Apply over primary dressing as directed. Secured With Compression Wrap ThreePress (3 layer compression wrap) Discharge Instruction: Apply three layer compression as  directed. Compression Stockings Add-Ons Electronic Signature(s) Signed: 07/21/2021 4:35:26 PM By: Linton Ham MD Signed: 07/21/2021 5:46:53 PM By: Levan Hurst RN, BSN Entered By: Linton Ham on 07/21/2021 11:39:50 -------------------------------------------------------------------------------- Multi-Disciplinary Care Plan Details Patient Name: Date of Service: [name], [name] 07/21/2021 9:30 A M Medical Record Number: [id number] Patient Account Number: [id number] Date of Birth/Sex: Treating RN: Jan 17, 1936 (86 y.o. [name] Primary Care Gladiola Madore: [name] Other Clinician: Referring Kayleen Alig: Treating Shenekia Riess/Extender: [name] in Treatment: 1 Multidisciplinary Care Plan reviewed with physician Active Inactive Venous Leg Ulcer Nursing Diagnoses: Knowledge deficit related to disease process and management Goals: Patient will maintain optimal edema control Date Initiated: 07/14/2021 Target Resolution Date: 08/14/2021 Goal Status: Active Patient/caregiver will verbalize understanding of disease process and disease management Date Initiated: 07/14/2021 Target Resolution Date: 08/14/2021 Goal Status: Active Interventions: Assess peripheral edema status every visit. Compression as ordered Provide education on venous insufficiency Notes: Wound/Skin Impairment Nursing Diagnoses: Impaired tissue integrity Knowledge deficit related to ulceration/compromised skin integrity Goals: Patient/caregiver will verbalize understanding of skin care regimen Date Initiated: 07/14/2021 Target Resolution Date: 08/14/2021 Goal Status: Active Ulcer/skin breakdown will have a volume reduction of 30% by week 4 Date Initiated: 07/14/2021 Target Resolution Date: 08/14/2021 Goal Status: Active Interventions: Assess patient/caregiver ability to obtain necessary supplies Assess patient/caregiver ability to perform ulcer/skin care regimen upon  admission and as needed Assess ulceration(s) every visit Provide education on ulcer and skin care Notes: Electronic Signature(s) Signed: 07/21/2021 10:04:59 AM By: [name] Entered By: [name] on 07/21/2021 10:04:58 -------------------------------------------------------------------------------- Pain Assessment Details Patient Name: Date of Service: [name], [name] 07/21/2021 9:30 A M Medical Record Number: [id number] Patient Account Number: [id number] Date of Birth/Sex: Treating RN: 22-Aug-1935 (86 y.o. [name], [name] Primary  Care Tawn Fitzner: [name] Other Clinician: Referring Donyae Kilner: Treating Bowden Boody/Extender: Anabel Bene in Treatment: 1 Active Problems Location of Pain Severity and Description of Pain Patient Has Paino Yes Site Locations Rate the pain. Current Pain Level: 5 Pain Management and Medication Current Pain Management: Electronic Signature(s) Signed: 07/23/2021 5:37:29 PM By: Fonnie Mu RN Entered By: Fonnie Mu on 07/21/2021 09:39:59 -------------------------------------------------------------------------------- Patient/Caregiver Education Details Patient Name: Date of Service: [name], [name] 1/24/2023andnbsp9:30 A M Medical Record Number: [id number] Patient Account Number: [id number] Date of Birth/Gender: Treating RN: April 15, 1936 (86 y.o. [name] Primary Care Physician: [name] Other Clinician: Referring Physician: Treating Physician/Extender: Anabel Bene in Treatment: 1 Education Assessment Education Provided To: Patient and Caregiver Education Topics Provided Venous: Methods: Explain/Verbal, Printed Responses: State content correctly Wound/Skin Impairment: Methods: Explain/Verbal, Printed Responses: State content correctly Electronic Signature(s) Signed: 07/21/2021 4:23:30 PM By: [name] Entered By: [name] on  07/21/2021 10:05:18 -------------------------------------------------------------------------------- Wound Assessment Details Patient Name: Date of Service: [name], [name] 07/21/2021 9:30 A M Medical Record Number: [id number] Patient Account Number: [id number] Date of Birth/Sex: Treating RN: 17-Mar-1936 (86 y.o. [name], [name] Primary Care Finn Altemose: [name] Other Clinician: Referring Baila Rouse: Treating Retal Tonkinson/Extender: [name] Weeks in Treatment: 1 Wound Status Wound Number: 1 Primary Venous Leg Ulcer Etiology: Wound Location: Right, Lateral Lower Leg Wound Open Wounding Event: Blister Status: Date Acquired: 03/28/2021 Comorbid Coronary Artery Disease, Hypertension, Myocardial Infarction, Weeks Of Treatment: 1 History: Peripheral Venous Disease, Type II Diabetes Clustered Wound: No Photos Wound Measurements Length: (cm) 5.9 Width: (cm) 6 Depth: (cm) 0.1 Area: (cm) 27.803 Volume: (cm) 2.78 % Reduction in Area: 22.2% % Reduction in Volume: 61.1% Epithelialization: None Tunneling: No Undermining: No Wound Description Classification: Full Thickness Without Exposed Support Structures Wound Margin: Distinct, outline attached Exudate Amount: Medium Exudate Type: Serosanguineous Exudate Color: red, brown Foul Odor After Cleansing: No Slough/Fibrino Yes Wound Bed Granulation Amount: Large (67-100%) Exposed Structure Granulation Quality: Red, Pink Fascia Exposed: No Necrotic Amount: Small (1-33%) Fat Layer (Subcutaneous Tissue) Exposed: Yes Necrotic Quality: Adherent Slough Tendon Exposed: No Muscle Exposed: No Joint Exposed: No Bone Exposed: No Treatment Notes Wound #1 (Lower Leg) Wound Laterality: Right, Lateral Cleanser Soap and Water Discharge Instruction: May shower and wash wound with dial antibacterial soap and water prior to dressing change. Wound Cleanser Discharge Instruction: Cleanse the wound with wound  cleanser prior to applying a clean dressing using gauze sponges, not tissue or cotton balls. Peri-Wound Care Triamcinolone 15 (g) Discharge Instruction: Use triamcinolone 15 (g) as directed Sween Lotion (Moisturizing lotion) Discharge Instruction: Apply moisturizing lotion as directed Topical Primary Dressing KerraCel Ag Gelling Fiber Dressing, 4x5 in (silver alginate) Discharge Instruction: Apply silver alginate to wound bed as instructed Secondary Dressing ABD Pad, 8x10 Discharge Instruction: Apply over primary dressing as directed. Zetuvit Plus 4x8 in Discharge Instruction: Apply over primary dressing as directed. Secured With Compression Wrap ThreePress (3 layer compression wrap) Discharge Instruction: Apply three layer compression as directed. Compression Stockings Add-Ons Electronic Signature(s) Signed: 07/21/2021 4:23:30 PM By: [name] Signed: 07/23/2021 5:37:29 PM By: Fonnie Mu RN Entered By: [name] on 07/21/2021 09:59:07 -------------------------------------------------------------------------------- Wound Assessment Details Patient Name: Date of Service: [name], [name] 07/21/2021 9:30 A M Medical Record Number: [id number] Patient Account Number: [id number] Date of Birth/Sex: Treating RN: May 05, 1936 (86 y.o. [name] Primary Care Hildreth Robart: [name] Other Clinician: Referring Murvin Gift: Treating Kaysa Roulhac/Extender: Anabel Bene  in Treatment: 1 Wound Status Wound Number: 2 Primary Venous Leg Ulcer Etiology: Wound Location: Right, Medial Lower Leg Wound Open Wounding Event: Blister Status: Date Acquired: 03/28/2021 Comorbid Coronary Artery Disease, Hypertension, Myocardial Infarction, Weeks Of Treatment: 1 History: Peripheral Venous Disease, Type II Diabetes Clustered Wound: No Photos Wound Measurements Length: (cm) 0.5 Width: (cm) 0.3 Depth: (cm) 0.1 Area: (cm) 0.118 Volume: (cm)  0.012 % Reduction in Area: 96.7% % Reduction in Volume: 96.7% Epithelialization: Large (67-100%) Tunneling: No Undermining: No Wound Description Classification: Full Thickness Without Exposed Support Structures Wound Margin: Distinct, outline attached Exudate Amount: Medium Exudate Type: Serosanguineous Exudate Color: red, brown Foul Odor After Cleansing: No Slough/Fibrino Yes Wound Bed Granulation Amount: Large (67-100%) Exposed Structure Granulation Quality: Red, Pink Fascia Exposed: No Necrotic Amount: Small (1-33%) Fat Layer (Subcutaneous Tissue) Exposed: Yes Necrotic Quality: Adherent Slough Tendon Exposed: No Muscle Exposed: No Joint Exposed: No Bone Exposed: No Treatment Notes Wound #2 (Lower Leg) Wound Laterality: Right, Medial Cleanser Soap and Water Discharge Instruction: May shower and wash wound with dial antibacterial soap and water prior to dressing change. Wound Cleanser Discharge Instruction: Cleanse the wound with wound cleanser prior to applying a clean dressing using gauze sponges, not tissue or cotton balls. Peri-Wound Care Triamcinolone 15 (g) Discharge Instruction: Use triamcinolone 15 (g) as directed Sween Lotion (Moisturizing lotion) Discharge Instruction: Apply moisturizing lotion as directed Topical Primary Dressing KerraCel Ag Gelling Fiber Dressing, 4x5 in (silver alginate) Discharge Instruction: Apply silver alginate to wound bed as instructed Secondary Dressing ABD Pad, 8x10 Discharge Instruction: Apply over primary dressing as directed. Zetuvit Plus 4x8 in Discharge Instruction: Apply over primary dressing as directed. Secured With Compression Wrap ThreePress (3 layer compression wrap) Discharge Instruction: Apply three layer compression as directed. Compression Stockings Add-Ons Electronic Signature(s) Signed: 07/21/2021 4:23:30 PM By: [name] Signed: 07/23/2021 5:37:29 PM By: Allen Hammock RN Entered By: [name]  on 07/21/2021 09:59:40 -------------------------------------------------------------------------------- Wound Assessment Details Patient Name: Date of Service: [name], [name] 07/21/2021 9:30 A M Medical Record Number: [id number] Patient Account Number: [id number] Date of Birth/Sex: Treating RN: 03-16-1936 (86 y.o. [name], [name] Primary Care Deshaun Weisinger: [name] Other Clinician: Referring Jahbari Repinski: Treating Omolola Mittman/Extender: Demetrio Lapping Weeks in Treatment: 1 Wound Status Wound Number: 3 Primary Venous Leg Ulcer Etiology: Wound Location: Right, Posterior Lower Leg Wound Open Wounding Event: Blister Status: Date Acquired: 03/28/2021 Comorbid Coronary Artery Disease, Hypertension, Myocardial Infarction, Weeks Of Treatment: 1 History: Peripheral Venous Disease, Type II Diabetes Clustered Wound: No Photos Wound Measurements Length: (cm) 7.5 Width: (cm) 10.1 Depth: (cm) 0.1 Area: (cm) 59.494 Volume: (cm) 5.949 % Reduction in Area: -1% % Reduction in Volume: -1% Epithelialization: None Tunneling: No Undermining: No Wound Description Classification: Full Thickness Without Exposed Support Structures Wound Margin: Distinct, outline attached Exudate Amount: Large Exudate Type: Serosanguineous Exudate Color: red, brown Foul Odor After Cleansing: No Slough/Fibrino Yes Wound Bed Granulation Amount: Large (67-100%) Exposed Structure Granulation Quality: Red, Pink Fascia Exposed: No Necrotic Amount: Small (1-33%) Fat Layer (Subcutaneous Tissue) Exposed: Yes Necrotic Quality: Adherent Slough Tendon Exposed: No Muscle Exposed: No Joint Exposed: No Bone Exposed: No Treatment Notes Wound #3 (Lower Leg) Wound Laterality: Right, Posterior Cleanser Soap and Water Discharge Instruction: May shower and wash wound with dial antibacterial soap and water prior to dressing change. Wound Cleanser Discharge Instruction: Cleanse the wound with  wound cleanser prior to applying a clean dressing using gauze sponges, not tissue or cotton balls. Peri-Wound Care Triamcinolone 15 (g) Discharge Instruction:  Use triamcinolone 15 (g) as directed Sween Lotion (Moisturizing lotion) Discharge Instruction: Apply moisturizing lotion as directed Topical Primary Dressing KerraCel Ag Gelling Fiber Dressing, 4x5 in (silver alginate) Discharge Instruction: Apply silver alginate to wound bed as instructed Secondary Dressing ABD Pad, 8x10 Discharge Instruction: Apply over primary dressing as directed. Zetuvit Plus 4x8 in Discharge Instruction: Apply over primary dressing as directed. Secured With Compression Wrap ThreePress (3 layer compression wrap) Discharge Instruction: Apply three layer compression as directed. Compression Stockings Add-Ons Electronic Signature(s) Signed: 07/21/2021 4:23:30 PM By: [name] Signed: 07/23/2021 5:37:29 PM By: Allen Hammock RN Entered By: [name] on 07/21/2021 10:00:09 -------------------------------------------------------------------------------- Vitals Details Patient Name: Date of Service: [name], [name] 07/21/2021 9:30 A M Medical Record Number: [id number] Patient Account Number: [id number] Date of Birth/Sex: Treating RN: May 03, 1936 (86 y.o. [name], [name] Primary Care Pietrina Jagodzinski: [name] Other Clinician: Referring Casey Fye: Treating Kona Yusuf/Extender: [name] in Treatment: 1 Vital Signs Time Taken: 09:39 Temperature (F): 98.7 Height (in): 70 Pulse (bpm): 65 Weight (lbs): 195 Respiratory Rate (breaths/min): 18 Body Mass Index (BMI): 28 Blood Pressure (mmHg): 188/83 Reference Range: 80 - 120 mg / dl Electronic Signature(s) Signed: 07/23/2021 5:37:29 PM By: Allen Hammock RN Entered By: [name] on 07/21/2021 09:39:52

## 2021-07-28 ENCOUNTER — Encounter (HOSPITAL_BASED_OUTPATIENT_CLINIC_OR_DEPARTMENT_OTHER): Payer: Medicare Other | Admitting: Internal Medicine

## 2021-07-28 ENCOUNTER — Other Ambulatory Visit: Payer: Self-pay

## 2021-07-28 DIAGNOSIS — I87331 Chronic venous hypertension (idiopathic) with ulcer and inflammation of right lower extremity: Secondary | ICD-10-CM | POA: Diagnosis not present

## 2021-07-28 NOTE — Progress Notes (Signed)
Allen ForemanCOPPADGE, Allen L. ([id number]) Visit Report for 07/28/2021 Arrival Information Details Patient Name: Date of Service: CO PPA [name], Allen HN L. 07/28/2021 10:15 A M Medical Record Number: [id number] Patient Account Number: [id number] Date of Birth/Sex: Treating RN: April 27, 1936 (86 y.o. [name]) [name] Primary Care [name], Buck [name]: Treating Shi Grose/Extender: [name], Allen [name] Weeks in Treatment: 2 Visit Information History Since Last Visit Added or deleted any medications: No Patient Arrived: Ambulatory Any new allergies or adverse reactions: No Arrival Time: 10:21 Had a fall or experienced change in No Accompanied By: daughter activities of daily living that may affect Transfer Assistance: None risk of falls: Patient Identification Verified: Yes Signs or symptoms of abuse/neglect since last visito No Secondary Verification Process Completed: Yes Hospitalized since last visit: No Patient Requires Transmission-Based Precautions: No Implantable device outside of the clinic excluding No Patient Has Alerts: No cellular tissue based products placed in the center since last visit: Has Dressing in Place as Prescribed: Yes Has Compression in Place as Prescribed: Yes Pain Present Now: No Electronic Signature(s) Signed: 07/28/2021 5:37:15 PM By: Allen AbtsLynch, Shatara RN, BSN Entered By: [name] on 07/28/2021 10:22:44 -------------------------------------------------------------------------------- Compression Therapy Details Patient Name: Date of Service: [name], Allen HN L. 07/28/2021 10:15 A M Medical Record Number: [id number] Patient Account Number: [id number] Date of Birth/Sex: Treating RN: April 27, 1936 (86 y.o. [name]) [name] Primary Care [name] Allen Clinician: Referring [name]: Treating [place] Nase/Extender: [name], Allen [name] Weeks in Treatment: 2 Compression Therapy Performed for Wound  Assessment: Wound #1 Right,Lateral Lower Leg Performed By: Clinician Allen AbtsLynch, Shatara, RN Compression Type: Three Layer Post Procedure Diagnosis Same as Pre-procedure Electronic Signature(s) Signed: 07/28/2021 5:37:15 PM By: Allen AbtsLynch, Shatara RN, BSN Entered By: [name] on 07/28/2021 11:21:40 -------------------------------------------------------------------------------- Compression Therapy Details Patient Name: Date of Service: [name], Allen HN L. 07/28/2021 10:15 A M Medical Record Number: [id number] Patient Account Number: [id number] Date of Birth/Sex: Treating RN: April 27, 1936 (86 y.o. [name]) [name] Primary Care Tansy Lorek: [name], Buck Allen Clinician: Referring Amijah Timothy: Treating Chandler Stofer/Extender: [name], Allen [name] Weeks in Treatment: 2 Compression Therapy Performed for Wound Assessment: Wound #3 Right,Posterior Lower Leg Performed By: Clinician Allen AbtsLynch, Shatara, RN Compression Type: Three Layer Post Procedure Diagnosis Same as Pre-procedure Electronic Signature(s) Signed: 07/28/2021 5:37:15 PM By: Allen AbtsLynch, Shatara RN, BSN Entered By: [name] on 07/28/2021 11:21:40 -------------------------------------------------------------------------------- Encounter Discharge Information Details Patient Name: Date of Service: [name], Allen HN L. 07/28/2021 10:15 A M Medical Record Number: [id number] Patient Account Number: [id number] Date of Birth/Sex: Treating RN: April 27, 1936 (86 y.o. [name]) [name] Primary Care [name]: [name], Buck Allen Clinician: Referring [name]: Treating [name]/Extender: [name], Allen [name] Weeks in Treatment: 2 Encounter Discharge Information Items Discharge Condition: Stable Ambulatory Status: Ambulatory Discharge Destination: Home Transportation: Private Auto Accompanied By: daughter Schedule Follow-up Appointment: Yes Clinical Summary of Care: Patient Declined Electronic Signature(s) Signed: 07/28/2021  5:37:15 PM By: Allen AbtsLynch, Shatara RN, BSN Entered By: [name] on 07/28/2021 12:02:50 -------------------------------------------------------------------------------- Lower Extremity Assessment Details Patient Name: Date of Service: [name], Allen HN L. 07/28/2021 10:15 A M Medical Record Number: [id number] Patient Account Number: [id number] Date of Birth/Sex: Treating RN: April 27, 1936 (86 y.o. [name]) [name] Primary Care [name]: [name], Buck Allen Clinician: Referring [name]: Treating [name]/Extender: [name], Allen [name] Weeks in Treatment: 2 Edema Assessment Assessed: [Left: No] [Right: No] Edema: [Left: Ye] [Right: s] Calf Left: Right: Point of Measurement: 33 cm From Medial Instep 41 cm Ankle Left: Right: Point of Measurement:  12 cm From Medial Instep 28 cm Vascular Assessment Pulses: Dorsalis Pedis Palpable: [Right:Yes] Electronic Signature(s) Signed: 07/28/2021 5:37:15 PM By: Allen Abts RN, BSN Entered By: Allen Abts on 07/28/2021 10:59:11 -------------------------------------------------------------------------------- Multi Wound Chart Details Patient Name: Date of Service: [name], Allen HN L. 07/28/2021 10:15 A M Medical Record Number: [id number] Patient Account Number: [id number] Date of Birth/Sex: Treating RN: 02-Jul-1935 (86 y.o. [name] Primary Care [name]: Allen Lou Allen Clinician: Referring [name]: Treating [name]/Extender: Allen Floor Weeks in Treatment: 2 Vital Signs Height(in): 70 Pulse(bpm): 60 Weight(lbs): 195 Blood Pressure(mmHg): 171/92 Body Mass Index(BMI): 28 Temperature(F): 98.4 Respiratory Rate(breaths/min): 18 Photos: Right, Lateral Lower Leg Right, Medial Lower Leg Right, Posterior Lower Leg Wound Location: Blister Blister Blister Wounding Event: Venous Leg Ulcer Venous Leg Ulcer Venous Leg Ulcer Primary Etiology: Coronary Artery Disease, Coronary Artery  Disease, Coronary Artery Disease, Comorbid History: Hypertension, Myocardial Infarction, Hypertension, Myocardial Infarction, Hypertension, Myocardial Infarction, Peripheral Venous Disease, Type II Peripheral Venous Disease, Type II Peripheral Venous Disease, Type II Diabetes Diabetes Diabetes 03/28/2021 03/28/2021 03/28/2021 Date Acquired: 2 2 2  Weeks of Treatment: Open Healed - Epithelialized Open Wound Status: No No No Wound Recurrence: 5x4.9x0.1 0x0x0 6.5x9.5x0.1 Measurements L x W x D (cm) 19.242 0 48.498 A (cm) : rea 1.924 0 4.85 Volume (cm) : 46.20% 100.00% 17.70% % Reduction in Area: 73.10% 100.00% 17.70% % Reduction in Volume: Full Thickness Without Exposed Full Thickness Without Exposed Full Thickness Without Exposed Classification: Support Structures Support Structures Support Structures Medium None Present Medium Exudate Amount: Serosanguineous N/A Serosanguineous Exudate Type: red, brown N/A red, brown Exudate Color: Distinct, outline attached Distinct, outline attached Distinct, outline attached Wound Margin: Large (67-100%) None Present (0%) Large (67-100%) Granulation Amount: Red, Pink N/A Red, Pink Granulation Quality: Small (1-33%) None Present (0%) Small (1-33%) Necrotic Amount: Fat Layer (Subcutaneous Tissue): Yes Fascia: No Fat Layer (Subcutaneous Tissue): Yes Exposed Structures: Fascia: No Fat Layer (Subcutaneous Tissue): No Fascia: No Tendon: No Tendon: No Tendon: No Muscle: No Muscle: No Muscle: No Joint: No Joint: No Joint: No Bone: No Bone: No Bone: No None Large (67-100%) Small (1-33%) Epithelialization: Compression Therapy N/A Compression Therapy Procedures Performed: Treatment Notes Electronic Signature(s) Signed: 07/28/2021 4:28:46 PM By: 07/30/2021 MD Signed: 07/28/2021 5:37:15 PM By: 07/30/2021 RN, BSN Entered By: Allen Abts on 07/28/2021  11:51:34 -------------------------------------------------------------------------------- Multi-Disciplinary Care Plan Details Patient Name: Date of Service: [name], Allen HN L. 07/28/2021 10:15 A M Medical Record Number: 07/30/2021 Patient Account Number: [id number] Date of Birth/Sex: Treating RN: 1935-10-11 (86 y.o. 91 Primary Care Ernan Runkles: [name] Allen Clinician: Referring Kaulin Chaves: Treating Marea Reasner/Extender: Allen Lou in Treatment: 2 Multidisciplinary Care Plan reviewed with physician Active Inactive Venous Leg Ulcer Nursing Diagnoses: Knowledge deficit related to disease process and management Goals: Patient will maintain optimal edema control Date Initiated: 07/14/2021 Target Resolution Date: 08/14/2021 Goal Status: Active Patient/caregiver will verbalize understanding of disease process and disease management Date Initiated: 07/14/2021 Target Resolution Date: 08/14/2021 Goal Status: Active Interventions: Assess peripheral edema status every visit. Compression as ordered Provide education on venous insufficiency Notes: Wound/Skin Impairment Nursing Diagnoses: Impaired tissue integrity Knowledge deficit related to ulceration/compromised skin integrity Goals: Patient/caregiver will verbalize understanding of skin care regimen Date Initiated: 07/14/2021 Target Resolution Date: 08/14/2021 Goal Status: Active Ulcer/skin breakdown will have a volume reduction of 30% by week 4 Date Initiated: 07/14/2021 Target Resolution Date: 08/14/2021 Goal Status: Active Interventions: Assess patient/caregiver ability to obtain necessary supplies Assess patient/caregiver ability  to perform ulcer/skin care regimen upon admission and as needed Assess ulceration(s) every visit Provide education on ulcer and skin care Notes: Electronic Signature(s) Signed: 07/28/2021 5:37:15 PM By: Allen AbtsLynch, Shatara RN, BSN Entered By: [name] on  07/28/2021 12:01:09 -------------------------------------------------------------------------------- Pain Assessment Details Patient Name: Date of Service: [name], Allen HN L. 07/28/2021 10:15 A M Medical Record Number: [id number] Patient Account Number: [id number] Date of Birth/Sex: Treating RN: May 02, 1936 (86 y.o. [name]) [name] Primary Care Ameriah Lint: [name], Buck Allen Clinician: Referring Doshie Maggi: Treating Nyimah Shadduck/Extender: [name], Allen [name] Weeks in Treatment: 2 Active Problems Location of Pain Severity and Description of Pain Patient Has Paino No Site Locations Pain Management and Medication Current Pain Management: Electronic Signature(s) Signed: 07/28/2021 5:37:15 PM By: Allen AbtsLynch, Shatara RN, BSN Entered By: [name] on 07/28/2021 10:59:03 -------------------------------------------------------------------------------- Patient/Caregiver Education Details Patient Name: Date of Service: [name], Chestine SporeJO HN L. 1/31/2023andnbsp10:15 A M Medical Record Number: [id number] Patient Account Number: [id number] Date of Birth/Gender: Treating RN: May 02, 1936 (86 y.o. [name]) [name] Primary Care Physician: [name], Buck Allen Clinician: Referring Physician: Treating Physician/Extender: [name], Allen [name] Weeks in Treatment: 2 Education Assessment Education Provided To: Patient Education Topics Provided Venous: Methods: Explain/Verbal Responses: State content correctly Wound/Skin Impairment: Methods: Explain/Verbal Responses: State content correctly Electronic Signature(s) Signed: 07/28/2021 5:37:15 PM By: Allen AbtsLynch, Shatara RN, BSN Entered By: [name] on 07/28/2021 12:01:24 -------------------------------------------------------------------------------- Wound Assessment Details Patient Name: Date of Service: [name], Allen HN L. 07/28/2021 10:15 A M Medical Record Number: [id number] Patient Account Number: [id number] Date of  Birth/Sex: Treating RN: May 02, 1936 (86 y.o. [name]) [name] Primary Care Aylan Bayona: [name], Buck Allen Clinician: Referring Hawkin Charo: Treating Yuvaan Olander/Extender: [name], Allen [name] Weeks in Treatment: 2 Wound Status Wound Number: 1 Primary Venous Leg Ulcer Etiology: Wound Location: Right, Lateral Lower Leg Wound Open Wounding Event: Blister Status: Date Acquired: 03/28/2021 Comorbid Coronary Artery Disease, Hypertension, Myocardial Infarction, Weeks Of Treatment: 2 History: Peripheral Venous Disease, Type II Diabetes Clustered Wound: No Photos Wound Measurements Length: (cm) 5 Width: (cm) 4.9 Depth: (cm) 0.1 Area: (cm) 19.242 Volume: (cm) 1.924 % Reduction in Area: 46.2% % Reduction in Volume: 73.1% Epithelialization: None Tunneling: No Undermining: No Wound Description Classification: Full Thickness Without Exposed Support Structures Wound Margin: Distinct, outline attached Exudate Amount: Medium Exudate Type: Serosanguineous Exudate Color: red, brown Foul Odor After Cleansing: No Slough/Fibrino Yes Wound Bed Granulation Amount: Large (67-100%) Exposed Structure Granulation Quality: Red, Pink Fascia Exposed: No Necrotic Amount: Small (1-33%) Fat Layer (Subcutaneous Tissue) Exposed: Yes Necrotic Quality: Adherent Slough Tendon Exposed: No Muscle Exposed: No Joint Exposed: No Bone Exposed: No Treatment Notes Wound #1 (Lower Leg) Wound Laterality: Right, Lateral Cleanser Soap and Water Discharge Instruction: May shower and wash wound with dial antibacterial soap and water prior to dressing change. Wound Cleanser Discharge Instruction: Cleanse the wound with wound cleanser prior to applying a clean dressing using gauze sponges, not tissue or cotton balls. Peri-Wound Care Triamcinolone 15 (g) Discharge Instruction: Use triamcinolone 15 (g) as directed Sween Lotion (Moisturizing lotion) Discharge Instruction: Apply moisturizing lotion as  directed Topical Primary Dressing KerraCel Ag Gelling Fiber Dressing, 4x5 in (silver alginate) Discharge Instruction: Apply silver alginate to wound bed as instructed Secondary Dressing ABD Pad, 8x10 Discharge Instruction: Apply over primary dressing as directed. Secured With Compression Wrap ThreePress (3 layer compression wrap) Discharge Instruction: Apply three layer compression as directed. Compression Stockings Add-Ons Electronic Signature(s) Signed: 07/28/2021 5:37:15 PM By: Allen AbtsLynch, Shatara RN, BSN Entered By: [name] on 07/28/2021 10:59:49 --------------------------------------------------------------------------------  Wound Assessment Details Patient Name: Date of Service: [name], Allen HN L. 07/28/2021 10:15 A M Medical Record Number: [id number] Patient Account Number: [id number] Date of Birth/Sex: Treating RN: 09-25-1935 (86 y.o. [name] Primary Care Danyon Mcginness: Allen Lou Allen Clinician: Referring Amaura Authier: Treating Sherel Fennell/Extender: Allen Floor Weeks in Treatment: 2 Wound Status Wound Number: 2 Primary Venous Leg Ulcer Etiology: Etiology: Wound Location: Right, Medial Lower Leg Wound Healed - Epithelialized Wounding Event: Blister Status: Date Acquired: 03/28/2021 Comorbid Coronary Artery Disease, Hypertension, Myocardial Infarction, Weeks Of Treatment: 2 History: Peripheral Venous Disease, Type II Diabetes Clustered Wound: No Photos Wound Measurements Length: (cm) Width: (cm) Depth: (cm) Area: (cm) Volume: (cm) 0 % Reduction in Area: 100% 0 % Reduction in Volume: 100% 0 Epithelialization: Large (67-100%) 0 Tunneling: No 0 Undermining: No Wound Description Classification: Full Thickness Without Exposed Support Structures Wound Margin: Distinct, outline attached Exudate Amount: None Present Foul Odor After Cleansing: No Slough/Fibrino No Wound Bed Granulation Amount: None Present (0%) Exposed  Structure Necrotic Amount: None Present (0%) Fascia Exposed: No Fat Layer (Subcutaneous Tissue) Exposed: No Tendon Exposed: No Muscle Exposed: No Joint Exposed: No Bone Exposed: No Electronic Signature(s) Signed: 07/28/2021 5:37:15 PM By: Allen Abts RN, BSN Entered By: Allen Abts on 07/28/2021 11:01:56 -------------------------------------------------------------------------------- Wound Assessment Details Patient Name: Date of Service: [name], Allen HN L. 07/28/2021 10:15 A M Medical Record Number: [id number] Patient Account Number: [id number] Date of Birth/Sex: Treating RN: [birth year] (86 y.o. [name] Primary Care Kennan Detter: Allen Lou Allen Clinician: Referring Caelan Atchley: Treating Justus Duerr/Extender: Allen Floor Weeks in Treatment: 2 Wound Status Wound Number: 3 Primary Venous Leg Ulcer Etiology: Wound Location: Right, Posterior Lower Leg Wound Open Wounding Event: Blister Status: Date Acquired: 03/28/2021 Comorbid Coronary Artery Disease, Hypertension, Myocardial Infarction, Weeks Of Treatment: 2 History: Peripheral Venous Disease, Type II Diabetes Clustered Wound: No Photos Wound Measurements Length: (cm) 6.5 Width: (cm) 9.5 Depth: (cm) 0.1 Area: (cm) 48.498 Volume: (cm) 4.85 % Reduction in Area: 17.7% % Reduction in Volume: 17.7% Epithelialization: Small (1-33%) Tunneling: No Undermining: No Wound Description Classification: Full Thickness Without Exposed Support Structures Wound Margin: Distinct, outline attached Exudate Amount: Medium Exudate Type: Serosanguineous Exudate Color: red, brown Foul Odor After Cleansing: No Slough/Fibrino Yes Wound Bed Granulation Amount: Large (67-100%) Exposed Structure Granulation Quality: Red, Pink Fascia Exposed: No Necrotic Amount: Small (1-33%) Fat Layer (Subcutaneous Tissue) Exposed: Yes Necrotic Quality: Adherent Slough Tendon Exposed: No Muscle Exposed: No Joint  Exposed: No Bone Exposed: No Treatment Notes Wound #3 (Lower Leg) Wound Laterality: Right, Posterior Cleanser Soap and Water Discharge Instruction: May shower and wash wound with dial antibacterial soap and water prior to dressing change. Wound Cleanser Discharge Instruction: Cleanse the wound with wound cleanser prior to applying a clean dressing using gauze sponges, not tissue or cotton balls. Peri-Wound Care Triamcinolone 15 (g) Discharge Instruction: Use triamcinolone 15 (g) as directed Sween Lotion (Moisturizing lotion) Discharge Instruction: Apply moisturizing lotion as directed Topical Primary Dressing KerraCel Ag Gelling Fiber Dressing, 4x5 in (silver alginate) Discharge Instruction: Apply silver alginate to wound bed as instructed Secondary Dressing ABD Pad, 8x10 Discharge Instruction: Apply over primary dressing as directed. Secured With Compression Wrap ThreePress (3 layer compression wrap) Discharge Instruction: Apply three layer compression as directed. Compression Stockings Add-Ons Electronic Signature(s) Signed: 07/28/2021 5:37:15 PM By: Allen Abts RN, BSN Signed: 07/28/2021 5:37:15 PM By: Allen Abts RN, BSN Entered By: Allen Abts on 07/28/2021 11:02:50 -------------------------------------------------------------------------------- Vitals Details Patient Name: Date of Service: CO  PPA [name], Allen HN L. 07/28/2021 10:15 A M Medical Record Number: [id number] Patient Account Number: [id number] Date of Birth/Sex: Treating RN: 1935-10-14 (86 y.o. [name] Primary Care Lorris Carducci: Allen Lou Allen Clinician: Referring Jassiah Viviano: Treating Alexius Hangartner/Extender: Allen Bene in Treatment: 2 Vital Signs Time Taken: 10:21 Temperature (F): 98.4 Height (in): 70 Pulse (bpm): 60 Weight (lbs): 195 Respiratory Rate (breaths/min): 18 Body Mass Index (BMI): 28 Blood Pressure (mmHg): 171/92 Reference Range: 80 - 120 mg /  dl Electronic Signature(s) Signed: 07/28/2021 5:37:15 PM By: Allen Abts RN, BSN Entered By: Allen Abts on 07/28/2021 10:25:40

## 2021-07-28 NOTE — Progress Notes (Signed)
VERA, WISHART ([id number]) Visit Report for 07/28/2021 HPI Details Patient Name: Date of Service: [name], JO [place] L. 07/28/2021 10:15 A M Medical Record Number: [id number] Patient Account Number: [id number] Date of Birth/Sex: Treating RN: 06-08-1936 (86 y.o. [name] Primary Care Provider: Earlie Buck Other Clinician: Referring Provider: Treating Provider/Extender: [name] in Treatment: 2 History of Present Illness HPI Description: ADMISSION 07/14/2021 This is a pleasant 86 year old man who arrived in clinic today accompanied by his granddaughter. He has had 3 wounds on the right leg over the last 2 months or so. Initially started as blisters that opened and increasing edema in the right leg. He has 3 open areas 1 right lateral 1 right medial and 1 right posterior. He was in the ER on 06/30/2021 and had topical bacitracin to apply to the wound areas. Was seen 2 days later by his primary doctor in addition to the doxycycline that he was given by the ER he was put on Augmentin. He is finished both of these antibiotics. He has worn compression stockings or may be just support stockings until very recently although they were about a year old, his son got them at [facility]. Past medical history includes gallstone pancreatitis, hypertension, hyperlipidemia, type 2 diabetes with last hemoglobin A1c in epic 3 years ago, coronary artery disease. He is still a cigarette smoker. ABI in our clinic was 0.72 on the right although this may have been hampered by the amount of edema in the leg 1/24; patient's wounds look a lot alert. He has edema can roll in the right leg with 3 layer compression. He also has severe venous insufficiency. His ABIs in our clinic were 0.72, I may check this next week when we have better edema can we do not yet have an appointment for arterial Dopplers 1/31; we rechecked his ABIs still at 0.7. We still do not have an appointment for  arterial Dopplers. We have been using 3 layer compression silver alginate. Most of this is severe chronic venous insufficiency and I think these are predominantly venous insufficiency wounds Electronic Signature(s) Signed: 07/28/2021 4:28:46 PM By: Allen Najjar MD Entered By: [name] on 07/28/2021 11:52:16 -------------------------------------------------------------------------------- Physical Exam Details Patient Name: Date of Service: [name], [name] 07/28/2021 10:15 A M Medical Record Number: [id number] Patient Account Number: [id number] Date of Birth/Sex: Treating RN: 05-28-1936 (86 y.o. [name] Primary Care Provider: Earlie Buck Other Clinician: Referring Provider: Treating Provider/Extender: [name] in Treatment: 2 Constitutional Patient is hypertensive.. Pulse regular and within target range for patient.Marland Kitchen Respirations regular, non-labored and within target range.. Temperature is normal and within the target range for the patient.Marland Kitchen Appears in no distress. Notes Wound exam; right lateral right medial and right posterior wounds. All of these are contracting. We have good edema control severe chronic venous hypertension. Electronic Signature(s) Signed: 07/28/2021 4:28:46 PM By: Allen Najjar MD Entered By: [name] on 07/28/2021 11:53:07 -------------------------------------------------------------------------------- Physician Orders Details Patient Name: Date of Service: [name], [name] 07/28/2021 10:15 A M Medical Record Number: [id number] Patient Account Number: [id number] Date of Birth/Sex: Treating RN: [birth year] (86 y.o. [name] Primary Care Provider: Earlie Buck Other Clinician: Referring Provider: Treating Provider/Extender: [name] in Treatment: 2 Verbal / Phone Orders: No Diagnosis Coding ICD-10 Coding Code Description [phone] Non-pressure chronic  ulcer of other part of right lower leg with other specified severity I87.331 Chronic venous hypertension (idiopathic) with ulcer  and inflammation of right lower extremity I89.0 Lymphedema, not elsewhere classified E11.622 Type 2 diabetes mellitus with other skin ulcer Follow-up Appointments ppointment in 1 week. - Dr. Leanord Hawkingobson Return A Bathing/ Shower/ Hygiene May shower with protection but do not get wound dressing(s) wet. - Ok to use [employer] officercast protector, can purchase at [facility], [facility], or [employer] Edema Control - Lymphedema / SCD / Other Elevate legs to the level of the heart or above for 30 minutes daily and/or when sitting, a frequency of: - throughout the day Avoid standing for long periods of time. Exercise regularly Moisturize legs daily. - left leg daily Home Health No change in wound care orders this week; continue Home Health for wound care. May utilize formulary equivalent dressing for wound treatment orders unless otherwise specified. Other Home Health Orders/Instructions: - [facility] Wound Treatment Wound #1 - Lower Leg Wound Laterality: Right, Lateral Cleanser: Soap and Water 2 x Per Week/7 Days Discharge Instructions: May shower and wash wound with dial antibacterial soap and water prior to dressing change. Cleanser: Wound Cleanser 2 x Per Week/7 Days Discharge Instructions: Cleanse the wound with wound cleanser prior to applying a clean dressing using gauze sponges, not tissue or cotton balls. Peri-Wound Care: Triamcinolone 15 (g) 2 x Per Week/7 Days Discharge Instructions: Use triamcinolone 15 (g) as directed Peri-Wound Care: Sween Lotion (Moisturizing lotion) 2 x Per Week/7 Days Discharge Instructions: Apply moisturizing lotion as directed Prim Dressing: KerraCel Ag Gelling Fiber Dressing, 4x5 in (silver alginate) 2 x Per Week/7 Days ary Discharge Instructions: Apply silver alginate to wound bed as instructed Secondary Dressing: ABD Pad, 8x10 2 x Per Week/7 Days Discharge  Instructions: Apply over primary dressing as directed. Compression Wrap: ThreePress (3 layer compression wrap) 2 x Per Week/7 Days Discharge Instructions: Apply three layer compression as directed. Wound #3 - Lower Leg Wound Laterality: Right, Posterior Cleanser: Soap and Water 1 x Per Week/7 Days Discharge Instructions: May shower and wash wound with dial antibacterial soap and water prior to dressing change. Cleanser: Wound Cleanser 1 x Per Week/7 Days Discharge Instructions: Cleanse the wound with wound cleanser prior to applying a clean dressing using gauze sponges, not tissue or cotton balls. Peri-Wound Care: Triamcinolone 15 (g) 1 x Per Week/7 Days Discharge Instructions: Use triamcinolone 15 (g) as directed Peri-Wound Care: Sween Lotion (Moisturizing lotion) 1 x Per Week/7 Days Discharge Instructions: Apply moisturizing lotion as directed Prim Dressing: KerraCel Ag Gelling Fiber Dressing, 4x5 in (silver alginate) 1 x Per Week/7 Days ary Discharge Instructions: Apply silver alginate to wound bed as instructed Secondary Dressing: ABD Pad, 8x10 1 x Per Week/7 Days Discharge Instructions: Apply over primary dressing as directed. Compression Wrap: ThreePress (3 layer compression wrap) 1 x Per Week/7 Days Discharge Instructions: Apply three layer compression as directed. Electronic Signature(s) Signed: 07/28/2021 4:28:46 PM By: Allen Najjarobson, Celso Granja MD Signed: 07/28/2021 5:37:15 PM By: Zandra AbtsLynch, Shatara RN, BSN Entered By: Zandra AbtsLynch, Shatara on 07/28/2021 11:05:54 -------------------------------------------------------------------------------- Problem List Details Patient Name: Date of Service: [name], [name] 07/28/2021 10:15 A M Medical Record Number: [id number] Patient Account Number: [id number] Date of Birth/Sex: Treating RN: 1936/04/07 (86 y.o. Allen KollerM) Lynch, Shatara Primary Care Provider: Earlie LouGarba, Mohammad Other Clinician: Referring Provider: Treating Provider/Extender: Allen Beneobson, Colen Eltzroth Garba,  Mohammad Weeks in Treatment: 2 Active Problems ICD-10 Encounter Code Description Active Date MDM Diagnosis L97.818 Non-pressure chronic ulcer of other part of right lower leg with other specified 07/14/2021 No Yes severity I87.331 Chronic venous hypertension (idiopathic) with ulcer and inflammation of right 07/14/2021  No Yes lower extremity I89.0 Lymphedema, not elsewhere classified 07/14/2021 No Yes E11.622 Type 2 diabetes mellitus with other skin ulcer 07/14/2021 No Yes Inactive Problems Resolved Problems Electronic Signature(s) Signed: 07/28/2021 4:28:46 PM By: Allen Najjar MD Entered By: [name] on 07/28/2021 11:50:26 -------------------------------------------------------------------------------- Progress Note Details Patient Name: Date of Service: [name], [name] 07/28/2021 10:15 A M Medical Record Number: [id number] Patient Account Number: [id number] Date of Birth/Sex: Treating RN: [birth year] (86 y.o. [name] Primary Care Provider: Earlie Buck Other Clinician: Referring Provider: Treating Provider/Extender: [name] in Treatment: 2 Subjective History of Present Illness (HPI) ADMISSION 07/14/2021 This is a pleasant 86 year old man who arrived in clinic today accompanied by his granddaughter. He has had 3 wounds on the right leg over the last 2 months or so. Initially started as blisters that opened and increasing edema in the right leg. He has 3 open areas 1 right lateral 1 right medial and 1 right posterior. He was in the ER on 06/30/2021 and had topical bacitracin to apply to the wound areas. Was seen 2 days later by his primary doctor in addition to the doxycycline that he was given by the ER he was put on Augmentin. He is finished both of these antibiotics. He has worn compression stockings or may be just support stockings until very recently although they were about a year old, his son got them at [facility]. Past medical  history includes gallstone pancreatitis, hypertension, hyperlipidemia, type 2 diabetes with last hemoglobin A1c in epic 3 years ago, coronary artery disease. He is still a cigarette smoker. ABI in our clinic was 0.72 on the right although this may have been hampered by the amount of edema in the leg 1/24; patient's wounds look a lot alert. He has edema can roll in the right leg with 3 layer compression. He also has severe venous insufficiency. His ABIs in our clinic were 0.72, I may check this next week when we have better edema can we do not yet have an appointment for arterial Dopplers 1/31; we rechecked his ABIs still at 0.7. We still do not have an appointment for arterial Dopplers. We have been using 3 layer compression silver alginate. Most of this is severe chronic venous insufficiency and I think these are predominantly venous insufficiency wounds Objective Constitutional Patient is hypertensive.. Pulse regular and within target range for patient.Marland Kitchen Respirations regular, non-labored and within target range.. Temperature is normal and within the target range for the patient.Marland Kitchen Appears in no distress. Vitals Time Taken: 10:21 AM, Height: 70 in, Weight: 195 lbs, BMI: 28, Temperature: 98.4 F, Pulse: 60 bpm, Respiratory Rate: 18 breaths/min, Blood Pressure: 171/92 mmHg. General Notes: Wound exam; right lateral right medial and right posterior wounds. All of these are contracting. We have good edema control severe chronic venous hypertension. Integumentary (Hair, Skin) Wound #1 status is Open. Original cause of wound was Blister. The date acquired was: 03/28/2021. The wound has been in treatment 2 weeks. The wound is located on the Right,Lateral Lower Leg. The wound measures 5cm length x 4.9cm width x 0.1cm depth; 19.242cm^2 area and 1.924cm^3 volume. There is Fat Layer (Subcutaneous Tissue) exposed. There is no tunneling or undermining noted. There is a medium amount of serosanguineous drainage  noted. The wound margin is distinct with the outline attached to the wound base. There is large (67-100%) red, pink granulation within the wound bed. There is a small (1-33%) amount of necrotic tissue within the wound bed including  Adherent Slough. Wound #2 status is Healed - Epithelialized. Original cause of wound was Blister. The date acquired was: 03/28/2021. The wound has been in treatment 2 weeks. The wound is located on the Right,Medial Lower Leg. The wound measures 0cm length x 0cm width x 0cm depth; 0cm^2 area and 0cm^3 volume. There is no tunneling or undermining noted. There is a none present amount of drainage noted. The wound margin is distinct with the outline attached to the wound base. There is no granulation within the wound bed. There is no necrotic tissue within the wound bed. Wound #3 status is Open. Original cause of wound was Blister. The date acquired was: 03/28/2021. The wound has been in treatment 2 weeks. The wound is located on the Right,Posterior Lower Leg. The wound measures 6.5cm length x 9.5cm width x 0.1cm depth; 48.498cm^2 area and 4.85cm^3 volume. There is Fat Layer (Subcutaneous Tissue) exposed. There is no tunneling or undermining noted. There is a medium amount of serosanguineous drainage noted. The wound margin is distinct with the outline attached to the wound base. There is large (67-100%) red, pink granulation within the wound bed. There is a small (1- 33%) amount of necrotic tissue within the wound bed including Adherent Slough. Assessment Active Problems ICD-10 Non-pressure chronic ulcer of other part of right lower leg with other specified severity Chronic venous hypertension (idiopathic) with ulcer and inflammation of right lower extremity Lymphedema, not elsewhere classified Type 2 diabetes mellitus with other skin ulcer Procedures Wound #1 Pre-procedure diagnosis of Wound #1 is a Venous Leg Ulcer located on the Right,Lateral Lower Leg . There was a  Three Layer Compression Therapy Procedure by Zandra AbtsLynch, Shatara, RN. Post procedure Diagnosis Wound #1: Same as Pre-Procedure Wound #3 Pre-procedure diagnosis of Wound #3 is a Venous Leg Ulcer located on the Right,Posterior Lower Leg . There was a Three Layer Compression Therapy Procedure by Zandra AbtsLynch, Shatara, RN. Post procedure Diagnosis Wound #3: Same as Pre-Procedure Plan Return Appointment in 1 week. - Dr. Leanord Hawkingobson Bathing/ Shower/ Hygiene: May shower with protection but do not get wound dressing(s) wet. - Ok to use [employer] officercast protector, can purchase at [facility], [facility], or [employer] Edema Control - Lymphedema / SCD / Other: Elevate legs to the level of the heart or above for 30 minutes daily and/or when sitting, a frequency of: - throughout the day Avoid standing for long periods of time. Exercise regularly Moisturize legs daily. - left leg daily Home Health: No change in wound care orders this week; continue Home Health for wound care. May utilize formulary equivalent dressing for wound treatment orders unless otherwise specified. Other Home Health Orders/Instructions: - [facility] WOUND #1: - Lower Leg Wound Laterality: Right, Lateral Cleanser: Soap and Water 2 x Per Week/7 Days Discharge Instructions: May shower and wash wound with dial antibacterial soap and water prior to dressing change. Cleanser: Wound Cleanser 2 x Per Week/7 Days Discharge Instructions: Cleanse the wound with wound cleanser prior to applying a clean dressing using gauze sponges, not tissue or cotton balls. Peri-Wound Care: Triamcinolone 15 (g) 2 x Per Week/7 Days Discharge Instructions: Use triamcinolone 15 (g) as directed Peri-Wound Care: Sween Lotion (Moisturizing lotion) 2 x Per Week/7 Days Discharge Instructions: Apply moisturizing lotion as directed Prim Dressing: KerraCel Ag Gelling Fiber Dressing, 4x5 in (silver alginate) 2 x Per Week/7 Days ary Discharge Instructions: Apply silver alginate to wound bed as  instructed Secondary Dressing: ABD Pad, 8x10 2 x Per Week/7 Days Discharge Instructions: Apply over primary dressing as directed.  Com pression Wrap: ThreePress (3 layer compression wrap) 2 x Per Week/7 Days Discharge Instructions: Apply three layer compression as directed. WOUND #3: - Lower Leg Wound Laterality: Right, Posterior Cleanser: Soap and Water 1 x Per Week/7 Days Discharge Instructions: May shower and wash wound with dial antibacterial soap and water prior to dressing change. Cleanser: Wound Cleanser 1 x Per Week/7 Days Discharge Instructions: Cleanse the wound with wound cleanser prior to applying a clean dressing using gauze sponges, not tissue or cotton balls. Peri-Wound Care: Triamcinolone 15 (g) 1 x Per Week/7 Days Discharge Instructions: Use triamcinolone 15 (g) as directed Peri-Wound Care: Sween Lotion (Moisturizing lotion) 1 x Per Week/7 Days Discharge Instructions: Apply moisturizing lotion as directed Prim Dressing: KerraCel Ag Gelling Fiber Dressing, 4x5 in (silver alginate) 1 x Per Week/7 Days ary Discharge Instructions: Apply silver alginate to wound bed as instructed Secondary Dressing: ABD Pad, 8x10 1 x Per Week/7 Days Discharge Instructions: Apply over primary dressing as directed. Com pression Wrap: ThreePress (3 layer compression wrap) 1 x Per Week/7 Days Discharge Instructions: Apply three layer compression as directed. 1. Still using silver alginate liberal TCA 2. We rechecked his ABIs in clinic today at 0.7. We have still not been able to get [facility] I would like to check this although I think these are for sure predominantly venous wounds. 3. His wounds are making decent progress he will need compression stockings Electronic Signature(s) Signed: 07/28/2021 4:28:46 PM By: Allen Najjar MD Entered By: [name] on 07/28/2021  11:56:21 -------------------------------------------------------------------------------- SuperBill Details Patient Name: Date of Service: [name], [name] 07/28/2021 Medical Record Number: [id number] Patient Account Number: [id number] Date of Birth/Sex: Treating RN: Jan 24, 1936 (86 y.o. [name] Primary Care Provider: Earlie Buck Other Clinician: Referring Provider: Treating Provider/Extender: [name] in Treatment: 2 Diagnosis Coding ICD-10 Codes Code Description [phone] Non-pressure chronic ulcer of other part of right lower leg with other specified severity I87.331 Chronic venous hypertension (idiopathic) with ulcer and inflammation of right lower extremity I89.0 Lymphedema, not elsewhere classified E11.622 Type 2 diabetes mellitus with other skin ulcer Facility Procedures CPT4 Code: [id number] Description: (Facility Use Only) [phone] - APPLY MULTLAY COMPRS LWR RT LEG Modifier: Quantity: 1 Physician Procedures : CPT4 Code Description Modifier [id number] - WC PHYS LEVEL 3 - EST PT ICD-10 Diagnosis Description L97.818 Non-pressure chronic ulcer of other part of right lower leg with other specified severity I87.331 Chronic venous hypertension (idiopathic) with  ulcer and inflammation of right lower extremity I89.0 Lymphedema, not elsewhere classified Quantity: 1 Electronic Signature(s) Signed: 07/28/2021 4:28:46 PM By: Allen Najjar MD Signed: 07/28/2021 5:37:15 PM By: Zandra Abts RN, BSN Entered By: Zandra Abts on 07/28/2021 12:02:13

## 2021-08-03 ENCOUNTER — Other Ambulatory Visit: Payer: Self-pay

## 2021-08-03 ENCOUNTER — Ambulatory Visit (HOSPITAL_COMMUNITY)
Admission: RE | Admit: 2021-08-03 | Discharge: 2021-08-03 | Disposition: A | Discharge status: Home / Self Care | Payer: Medicare Other | Source: Ambulatory Visit | Attending: Internal Medicine | Admitting: Internal Medicine

## 2021-08-03 ENCOUNTER — Other Ambulatory Visit (HOSPITAL_COMMUNITY): Payer: Self-pay | Admitting: Internal Medicine

## 2021-08-03 DIAGNOSIS — L97818 Non-pressure chronic ulcer of other part of right lower leg with other specified severity: Secondary | ICD-10-CM

## 2021-08-04 ENCOUNTER — Encounter (HOSPITAL_BASED_OUTPATIENT_CLINIC_OR_DEPARTMENT_OTHER): Payer: Medicare Other | Attending: Internal Medicine | Admitting: Internal Medicine

## 2021-08-04 DIAGNOSIS — L97818 Non-pressure chronic ulcer of other part of right lower leg with other specified severity: Secondary | ICD-10-CM | POA: Diagnosis not present

## 2021-08-04 DIAGNOSIS — I890 Lymphedema, not elsewhere classified: Secondary | ICD-10-CM | POA: Diagnosis not present

## 2021-08-04 DIAGNOSIS — I87331 Chronic venous hypertension (idiopathic) with ulcer and inflammation of right lower extremity: Secondary | ICD-10-CM | POA: Diagnosis not present

## 2021-08-04 DIAGNOSIS — E11622 Type 2 diabetes mellitus with other skin ulcer: Secondary | ICD-10-CM | POA: Insufficient documentation

## 2021-08-05 NOTE — Progress Notes (Signed)
Harrie ForemanCOPPADGE, Colie L. ([id number]) Visit Report for 08/04/2021 HPI Details Patient Name: Date of Service: [name], Chestine SporeJO HN L. 08/04/2021 1:45 PM Medical Record Number: [id number] Patient Account Number: [id number] Date of Birth/Sex: Treating RN: 1935/09/09 (86 y.o. Elizebeth KollerM) Lynch, Shatara Primary Care Provider: Earlie LouGarba, Mohammad Other Clinician: Referring Provider: Treating Provider/Extender: [name], Alliyah Roesler Garba, Mohammad Weeks in Treatment: 3 History of Present Illness HPI Description: ADMISSION 07/14/2021 This is a pleasant 86 year old man who arrived in clinic today accompanied by his granddaughter. He has had 3 wounds on the right leg over the last 2 months or so. Initially started as blisters that opened and increasing edema in the right leg. He has 3 open areas 1 right lateral 1 right medial and 1 right posterior. He was in the ER on 06/30/2021 and had topical bacitracin to apply to the wound areas. Was seen 2 days later by his primary doctor in addition to the doxycycline that he was given by the ER he was put on Augmentin. He is finished both of these antibiotics. He has worn compression stockings or may be just support stockings until very recently although they were about a year old, [facility]. Past medical history includes gallstone pancreatitis, hypertension, hyperlipidemia, type 2 diabetes with last hemoglobin A1c in epic 3 years ago, coronary artery disease. He is still a cigarette smoker. ABI in our clinic was 0.72 on the right although this may have been hampered by the amount of edema in the leg 1/24; patient's wounds look a lot alert. He has edema can roll in the right leg with 3 layer compression. He also has severe venous insufficiency. His ABIs in our clinic were 0.72, I may check this next week when we have better edema can we do not yet have an appointment for arterial Dopplers 1/31; we rechecked his ABIs still at 0.7. We still do not have an appointment for arterial  Dopplers. We have been using 3 layer compression silver alginate. Most of this is severe chronic venous insufficiency and I think these are predominantly venous insufficiency wounds 2/7; the patient is finally gotten his vascular studies done and on the right he had an ABI of 0.56 with monophasic waveforms with a great toe pressure of only 0.2 on the left ABI of 0.84. The PTA could not be heard monophasic waveforms with a great toe pressure of 0.49. This is indicative of significant PAD although within the amount of walking the patient does I am unable to elicit a history of claudication Electronic Signature(s) Signed: 08/04/2021 5:02:53 PM By: Baltazar Najjarobson, Kaydense Rizo MD Entered By: Baltazar Najjarobson, Riko Lumsden on 08/04/2021 14:39:01 -------------------------------------------------------------------------------- Physical Exam Details Patient Name: Date of Service: [name], [name] 08/04/2021 1:45 PM Medical Record Number: [id number] Patient Account Number: [id number] Date of Birth/Sex: Treating RN: 1935/09/09 (86 y.o. Elizebeth KollerM) Lynch, Shatara Primary Care Provider: Earlie LouGarba, Mohammad Other Clinician: Referring Provider: Treating Provider/Extender: [name], Anastacia Reinecke Garba, Mohammad Weeks in Treatment: 3 Constitutional Patient is hypertensive.. Pulse regular and within target range for patient.Marland Kitchen. Respirations regular, non-labored and within target range.. Temperature is normal and within the target range for the patient.Marland Kitchen. Appears in no distress. Notes Wound exam; right lateral and right posterior leg wounds. Generally these look better. Severe significant venous hypertension with hemosiderin deposition. His edema control is adequate Electronic Signature(s) Signed: 08/04/2021 5:02:53 PM By: Baltazar Najjarobson, Jhordan Mckibben MD Entered By: Baltazar Najjarobson, Aidian Salomon on 08/04/2021 14:40:00 -------------------------------------------------------------------------------- Physician Orders Details Patient Name: Date of Service: [name], [name] 08/04/2021  1:45 PM Medical Record Number: [id number] Patient Account Number: [id number] Date of Birth/Sex: Treating RN: 04/07/1936 (86 y.o. [name] Primary Care Provider: [name] Other Clinician: Referring Provider: Treating Provider/Extender: Anabel Bene in Treatment: 3 Verbal / Phone Orders: No Diagnosis Coding ICD-10 Coding Code Description [phone] Non-pressure chronic ulcer of other part of right lower leg with other specified severity I87.331 Chronic venous hypertension (idiopathic) with ulcer and inflammation of right lower extremity I89.0 Lymphedema, not elsewhere classified E11.622 Type 2 diabetes mellitus with other skin ulcer Follow-up Appointments ppointment in 1 week. - Dr. Leanord Hawking Return A Bathing/ Shower/ Hygiene May shower with protection but do not get wound dressing(s) wet. - Ok to use Arts development officer, can purchase at [facility], [facility], or [employer] Edema Control - Lymphedema / SCD / Other Elevate legs to the level of the heart or above for 30 minutes daily and/or when sitting, a frequency of: - throughout the day Avoid standing for long periods of time. Exercise regularly Moisturize legs daily. - left leg daily Home Health Discontinue home health for wound care. - [facility] Wound Treatment Wound #1 - Lower Leg Wound Laterality: Right, Lateral Cleanser: Soap and Water 2 x Per Week/7 Days Discharge Instructions: May shower and wash wound with dial antibacterial soap and water prior to dressing change. Cleanser: Wound Cleanser 2 x Per Week/7 Days Discharge Instructions: Cleanse the wound with wound cleanser prior to applying a clean dressing using gauze sponges, not tissue or cotton balls. Peri-Wound Care: Triamcinolone 15 (g) 2 x Per Week/7 Days Discharge Instructions: Use triamcinolone 15 (g) as directed Peri-Wound Care: Sween Lotion (Moisturizing lotion) 2 x Per Week/7 Days Discharge Instructions: Apply moisturizing lotion as  directed Prim Dressing: KerraCel Ag Gelling Fiber Dressing, 4x5 in (silver alginate) 2 x Per Week/7 Days ary Discharge Instructions: Apply silver alginate to wound bed as instructed Secondary Dressing: ABD Pad, 8x10 2 x Per Week/7 Days Discharge Instructions: Apply over primary dressing as directed. Compression Wrap: ThreePress (3 layer compression wrap) 2 x Per Week/7 Days Discharge Instructions: Apply three layer compression as directed. Wound #3 - Lower Leg Wound Laterality: Right, Posterior Cleanser: Soap and Water 1 x Per Week/7 Days Discharge Instructions: May shower and wash wound with dial antibacterial soap and water prior to dressing change. Cleanser: Wound Cleanser 1 x Per Week/7 Days Discharge Instructions: Cleanse the wound with wound cleanser prior to applying a clean dressing using gauze sponges, not tissue or cotton balls. Peri-Wound Care: Triamcinolone 15 (g) 1 x Per Week/7 Days Discharge Instructions: Use triamcinolone 15 (g) as directed Peri-Wound Care: Sween Lotion (Moisturizing lotion) 1 x Per Week/7 Days Discharge Instructions: Apply moisturizing lotion as directed Prim Dressing: KerraCel Ag Gelling Fiber Dressing, 4x5 in (silver alginate) 1 x Per Week/7 Days ary Discharge Instructions: Apply silver alginate to wound bed as instructed Secondary Dressing: ABD Pad, 8x10 1 x Per Week/7 Days Discharge Instructions: Apply over primary dressing as directed. Compression Wrap: ThreePress (3 layer compression wrap) 1 x Per Week/7 Days Discharge Instructions: Apply three layer compression as directed. Consults Vascular Surgeon - Abnormal arterial study 08/03/21 - (ICD10 I87.331 - Chronic venous hypertension (idiopathic) with ulcer and inflammation of right lower extremity) Electronic Signature(s) Signed: 08/04/2021 5:02:53 PM By: Baltazar Najjar MD Signed: 08/04/2021 6:14:04 PM By: Zandra Abts RN, BSN Entered By: Zandra Abts on 08/04/2021 14:27:42 Prescription  08/04/2021 -------------------------------------------------------------------------------- Thora Lance MD Patient Name: Provider: [id number] Date of Birth: NPI#: [id number] Sex: DEA #: [phone] [id number]  Phone #: License #: Edyth Gunnels [facility] Patient Address: [address] [place], [place] [address] [address] [place], [place] [address] [phone] Allergies No Known Drug Allergies Provider's Orders Vascular Surgeon - ICD10: I87.331 - Abnormal arterial study 08/03/21 Hand Signature: Date(s): Electronic Signature(s) Signed: 08/04/2021 5:02:53 PM By: Baltazar Najjar MD Signed: 08/04/2021 6:14:04 PM By: Zandra Abts RN, BSN Entered By: Zandra Abts on 08/04/2021 14:27:42 -------------------------------------------------------------------------------- Problem List Details Patient Name: Date of Service: [name], [name] 08/04/2021 1:45 PM Medical Record Number: [id number] Patient Account Number: [id number] Date of Birth/Sex: Treating RN: [birth year] (86 y.o. [name] Primary Care Provider: [name] Other Clinician: Referring Provider: Treating Provider/Extender: Anabel Bene in Treatment: 3 Active Problems ICD-10 Encounter Code Description Active Date MDM Diagnosis L97.818 Non-pressure chronic ulcer of other part of right lower leg with other specified 07/14/2021 No Yes severity I87.331 Chronic venous hypertension (idiopathic) with ulcer and inflammation of right 07/14/2021 No Yes lower extremity I89.0 Lymphedema, not elsewhere classified 07/14/2021 No Yes E11.622 Type 2 diabetes mellitus with other skin ulcer 07/14/2021 No Yes Inactive Problems Resolved Problems Electronic Signature(s) Signed: 08/04/2021 5:02:53 PM By: Baltazar Najjar MD Entered By: Baltazar Najjar on 08/04/2021  14:34:31 -------------------------------------------------------------------------------- Progress Note Details Patient Name: Date of Service: [name], [name] 08/04/2021 1:45 PM Medical Record Number: [id number] Patient Account Number: [id number] Date of Birth/Sex: Treating RN: 03-Dec-1935 (86 y.o. [name] Primary Care Provider: [name] Other Clinician: Referring Provider: Treating Provider/Extender: Anabel Bene in Treatment: 3 Subjective History of Present Illness (HPI) ADMISSION 07/14/2021 This is a pleasant 86 year old man who arrived in clinic today accompanied by his granddaughter. He has had 3 wounds on the right leg over the last 2 months or so. Initially started as blisters that opened and increasing edema in the right leg. He has 3 open areas 1 right lateral 1 right medial and 1 right posterior. He was in the ER on 06/30/2021 and had topical bacitracin to apply to the wound areas. Was seen 2 days later by his primary doctor in addition to the doxycycline that he was given by the ER he was put on Augmentin. He is finished both of these antibiotics. He has worn compression stockings or may be just support stockings until very recently although they were about a year old, his son got them at [facility]. Past medical history includes gallstone pancreatitis, hypertension, hyperlipidemia, type 2 diabetes with last hemoglobin A1c in epic 3 years ago, coronary artery disease. He is still a cigarette smoker. ABI in our clinic was 0.72 on the right although this may have been hampered by the amount of edema in the leg 1/24; patient's wounds look a lot alert. He has edema can roll in the right leg with 3 layer compression. He also has severe venous insufficiency. His ABIs in our clinic were 0.72, I may check this next week when we have better edema can we do not yet have an appointment for arterial Dopplers 1/31; we rechecked his ABIs still at 0.7. We  still do not have an appointment for arterial Dopplers. We have been using 3 layer compression silver alginate. Most of this is severe chronic venous insufficiency and I think these are predominantly venous insufficiency wounds 2/7; the patient is finally gotten his vascular studies done and on the right he had an ABI of 0.56 with monophasic waveforms with a great toe pressure  of only 0.2 on the left ABI of 0.84. The PTA could not be heard monophasic waveforms with a great toe pressure of 0.49. This is indicative of significant PAD although within the amount of walking the patient does I am unable to elicit a history of claudication Objective Constitutional Patient is hypertensive.. Pulse regular and within target range for patient.Marland Kitchen. Respirations regular, non-labored and within target range.. Temperature is normal and within the target range for the patient.Marland Kitchen. Appears in no distress. Vitals Time Taken: 1:52 PM, Height: 70 in, Weight: 195 lbs, BMI: 28, Temperature: 98.7 F, Pulse: 64 bpm, Respiratory Rate: 18 breaths/min, Blood Pressure: 162/68 mmHg. General Notes: Wound exam; right lateral and right posterior leg wounds. Generally these look better. Severe significant venous hypertension with hemosiderin deposition. His edema control is adequate Integumentary (Hair, Skin) Wound #1 status is Open. Original cause of wound was Blister. The date acquired was: 03/28/2021. The wound has been in treatment 3 weeks. The wound is located on the Right,Lateral Lower Leg. The wound measures 0.5cm length x 0.5cm width x 0.1cm depth; 0.196cm^2 area and 0.02cm^3 volume. There is Fat Layer (Subcutaneous Tissue) exposed. There is no tunneling or undermining noted. There is a medium amount of serosanguineous drainage noted. The wound margin is distinct with the outline attached to the wound base. There is large (67-100%) red, pink granulation within the wound bed. There is a small (1-33%) amount of necrotic tissue  within the wound bed including Adherent Slough. Wound #3 status is Open. Original cause of wound was Blister. The date acquired was: 03/28/2021. The wound has been in treatment 3 weeks. The wound is located on the Right,Posterior Lower Leg. The wound measures 2.6cm length x 5.5cm width x 0.1cm depth; 11.231cm^2 area and 1.123cm^3 volume. There is Fat Layer (Subcutaneous Tissue) exposed. There is no tunneling or undermining noted. There is a medium amount of serosanguineous drainage noted. The wound margin is distinct with the outline attached to the wound base. There is large (67-100%) red, pink granulation within the wound bed. There is a small (1- 33%) amount of necrotic tissue within the wound bed including Adherent Slough. Assessment Active Problems ICD-10 Non-pressure chronic ulcer of other part of right lower leg with other specified severity Chronic venous hypertension (idiopathic) with ulcer and inflammation of right lower extremity Lymphedema, not elsewhere classified Type 2 diabetes mellitus with other skin ulcer Procedures Wound #1 Pre-procedure diagnosis of Wound #1 is a Venous Leg Ulcer located on the Right,Lateral Lower Leg . There was a Three Layer Compression Therapy Procedure by Zandra AbtsLynch, Shatara, RN. Post procedure Diagnosis Wound #1: Same as Pre-Procedure Wound #3 Pre-procedure diagnosis of Wound #3 is a Venous Leg Ulcer located on the Right,Posterior Lower Leg . There was a Three Layer Compression Therapy Procedure by Zandra AbtsLynch, Shatara, RN. Post procedure Diagnosis Wound #3: Same as Pre-Procedure Plan Follow-up Appointments: Return Appointment in 1 week. - Dr. Leanord Hawkingobson Bathing/ Shower/ Hygiene: May shower with protection but do not get wound dressing(s) wet. - Ok to use Arts development officercast protector, can purchase at [facility], [facility], or [employer] Edema Control - Lymphedema / SCD / Other: Elevate legs to the level of the heart or above for 30 minutes daily and/or when sitting, a frequency of: -  throughout the day Avoid standing for long periods of time. Exercise regularly Moisturize legs daily. - left leg daily Home Health: Discontinue home health for wound care. - [facility] Consults ordered were: Vascular Surgeon - Abnormal arterial study 08/03/21 WOUND #1: - Lower Leg Wound  Laterality: Right, Lateral Cleanser: Soap and Water 2 x Per Week/7 Days Discharge Instructions: May shower and wash wound with dial antibacterial soap and water prior to dressing change. Cleanser: Wound Cleanser 2 x Per Week/7 Days Discharge Instructions: Cleanse the wound with wound cleanser prior to applying a clean dressing using gauze sponges, not tissue or cotton balls. Peri-Wound Care: Triamcinolone 15 (g) 2 x Per Week/7 Days Discharge Instructions: Use triamcinolone 15 (g) as directed Peri-Wound Care: Sween Lotion (Moisturizing lotion) 2 x Per Week/7 Days Discharge Instructions: Apply moisturizing lotion as directed Prim Dressing: KerraCel Ag Gelling Fiber Dressing, 4x5 in (silver alginate) 2 x Per Week/7 Days ary Discharge Instructions: Apply silver alginate to wound bed as instructed Secondary Dressing: ABD Pad, 8x10 2 x Per Week/7 Days Discharge Instructions: Apply over primary dressing as directed. Com pression Wrap: ThreePress (3 layer compression wrap) 2 x Per Week/7 Days Discharge Instructions: Apply three layer compression as directed. WOUND #3: - Lower Leg Wound Laterality: Right, Posterior Cleanser: Soap and Water 1 x Per Week/7 Days Discharge Instructions: May shower and wash wound with dial antibacterial soap and water prior to dressing change. Cleanser: Wound Cleanser 1 x Per Week/7 Days Discharge Instructions: Cleanse the wound with wound cleanser prior to applying a clean dressing using gauze sponges, not tissue or cotton balls. Peri-Wound Care: Triamcinolone 15 (g) 1 x Per Week/7 Days Discharge Instructions: Use triamcinolone 15 (g) as directed Peri-Wound Care: Sween Lotion  (Moisturizing lotion) 1 x Per Week/7 Days Discharge Instructions: Apply moisturizing lotion as directed Prim Dressing: KerraCel Ag Gelling Fiber Dressing, 4x5 in (silver alginate) 1 x Per Week/7 Days ary Discharge Instructions: Apply silver alginate to wound bed as instructed Secondary Dressing: ABD Pad, 8x10 1 x Per Week/7 Days Discharge Instructions: Apply over primary dressing as directed. Com pression Wrap: ThreePress (3 layer compression wrap) 1 x Per Week/7 Days Discharge Instructions: Apply three layer compression as directed. 1. #1 predominantly posterior and lateral right lower extremity wounds. These are mostly venous insufficiency however the patient has significant PAD. 2. The patient needs to be seen by vascular surgery with regards to his significant PAD. The predominantly venous wounds however are healing Electronic Signature(s) Signed: 08/04/2021 5:02:53 PM By: Baltazar Najjar MD Entered By: Baltazar Najjar on 08/04/2021 14:41:14 -------------------------------------------------------------------------------- SuperBill Details Patient Name: Date of Service: [name], [name] 08/04/2021 Medical Record Number: [id number] Patient Account Number: [id number] Date of Birth/Sex: Treating RN: 13-Nov-1935 (86 y.o. [name] Primary Care Provider: [name] Other Clinician: Referring Provider: Treating Provider/Extender: Anabel Bene in Treatment: 3 Diagnosis Coding ICD-10 Codes Code Description [phone] Non-pressure chronic ulcer of other part of right lower leg with other specified severity I87.331 Chronic venous hypertension (idiopathic) with ulcer and inflammation of right lower extremity I89.0 Lymphedema, not elsewhere classified E11.622 Type 2 diabetes mellitus with other skin ulcer Facility Procedures CPT4 Code: [id number] Description: (Facility Use Only) [phone] - APPLY MULTLAY COMPRS LWR RT LEG Modifier: Quantity: 1 Physician  Procedures Electronic Signature(s) Signed: 08/04/2021 6:14:04 PM By: Zandra Abts RN, BSN Signed: 08/05/2021 4:37:10 PM By: Baltazar Najjar MD Previous Signature: 08/04/2021 5:02:53 PM Version By: Baltazar Najjar MD Entered By: Zandra Abts on 08/04/2021 17:38:12

## 2021-08-05 NOTE — Progress Notes (Signed)
Harrie ForemanCOPPADGE, Clint L. ([id number]) Visit Report for 08/04/2021 Arrival Information Details Patient Name: Date of Service: CO PPA Hildred AlaminDGE, [name] 08/04/2021 1:45 PM Medical Record Number: [id number] Patient Account Number: [id number] Date of Birth/Sex: Treating RN: 27-Mar-1936 (86 y.o. Elizebeth KollerM) Lynch, Shatara Primary Care Claudis Giovanelli: Earlie LouGarba, Mohammad Other Clinician: Referring Niesha Bame: Treating Hashem Goynes/Extender: [name], [name], Mohammad Weeks in Treatment: 3 Visit Information History Since Last Visit Added or deleted any medications: No Patient Arrived: Ambulatory Any new allergies or adverse reactions: No Arrival Time: 13:51 Had a fall or experienced change in No Accompanied By: neice activities of daily living that may affect Transfer Assistance: None risk of falls: Patient Identification Verified: Yes Signs or symptoms of abuse/neglect since last visito No Secondary Verification Process Completed: Yes Hospitalized since last visit: No Patient Requires Transmission-Based Precautions: No Implantable device outside of the clinic excluding No Patient Has Alerts: No cellular tissue based products placed in the center since last visit: Has Dressing in Place as Prescribed: Yes Pain Present Now: No Electronic Signature(s) Signed: 08/05/2021 2:07:25 PM By: Karl Itoawkins, Destiny Entered By: Karl Itoawkins, Destiny on 08/04/2021 13:52:13 -------------------------------------------------------------------------------- Compression Therapy Details Patient Name: Date of Service: [name], [name] 08/04/2021 1:45 PM Medical Record Number: [id number] Patient Account Number: [id number] Date of Birth/Sex: Treating RN: 27-Mar-1936 (86 y.o. Elizebeth KollerM) Lynch, Shatara Primary Care Raynetta Osterloh: Earlie LouGarba, Mohammad Other Clinician: Referring Ramisa Duman: Treating Garnett Rekowski/Extender: [name], [name], Mohammad Weeks in Treatment: 3 Compression Therapy Performed for Wound Assessment: Wound #1 Right,Lateral Lower Leg Performed By:  Clinician Zandra AbtsLynch, Shatara, RN Compression Type: Three Layer Post Procedure Diagnosis Same as Pre-procedure Electronic Signature(s) Signed: 08/04/2021 6:14:04 PM By: Zandra AbtsLynch, Shatara RN, BSN Entered By: Zandra AbtsLynch, Shatara on 08/04/2021 14:28:53 -------------------------------------------------------------------------------- Compression Therapy Details Patient Name: Date of Service: [name], [name] 08/04/2021 1:45 PM Medical Record Number: [id number] Patient Account Number: [id number] Date of Birth/Sex: Treating RN: 27-Mar-1936 (86 y.o. Elizebeth KollerM) Lynch, Shatara Primary Care Yanice Maqueda: Earlie LouGarba, Mohammad Other Clinician: Referring Henley Boettner: Treating Keenan Dimitrov/Extender: [name], [name], Mohammad Weeks in Treatment: 3 Compression Therapy Performed for Wound Assessment: Wound #3 Right,Posterior Lower Leg Performed By: Clinician Zandra AbtsLynch, Shatara, RN Compression Type: Three Layer Post Procedure Diagnosis Same as Pre-procedure Electronic Signature(s) Signed: 08/04/2021 6:14:04 PM By: Zandra AbtsLynch, Shatara RN, BSN Entered By: Zandra AbtsLynch, Shatara on 08/04/2021 14:28:53 -------------------------------------------------------------------------------- Encounter Discharge Information Details Patient Name: Date of Service: [name], [name] 08/04/2021 1:45 PM Medical Record Number: [id number] Patient Account Number: [id number] Date of Birth/Sex: Treating RN: 27-Mar-1936 (86 y.o. Elizebeth KollerM) Lynch, Shatara Primary Care Holiday Mcmenamin: Earlie LouGarba, Mohammad Other Clinician: Referring Roniyah Llorens: Treating Lyzbeth Genrich/Extender: [name], [name], Mohammad Weeks in Treatment: 3 Encounter Discharge Information Items Discharge Condition: Stable Ambulatory Status: Ambulatory Discharge Destination: Home Transportation: Private Auto Accompanied By: alone Schedule Follow-up Appointment: Yes Clinical Summary of Care: Patient Declined Electronic Signature(s) Signed: 08/04/2021 6:14:04 PM By: Zandra AbtsLynch, Shatara RN, BSN Entered By: Zandra AbtsLynch, Shatara on  08/04/2021 17:39:08 -------------------------------------------------------------------------------- Lower Extremity Assessment Details Patient Name: Date of Service: [name], [name] 08/04/2021 1:45 PM Medical Record Number: [id number] Patient Account Number: [id number] Date of Birth/Sex: Treating RN: 27-Mar-1936 (86 y.o. Elizebeth KollerM) Lynch, Shatara Primary Care Grover Robinson: Earlie LouGarba, Mohammad Other Clinician: Referring Nikiah Goin: Treating Tyia Binford/Extender: [name], [name], Mohammad Weeks in Treatment: 3 Edema Assessment Assessed: [Left: No] [Right: No] Edema: [Left: Ye] [Right: s] Calf Left: Right: Point of Measurement: 33 cm From Medial Instep 41 cm Ankle Left: Right: Point of Measurement: 12 cm From Medial Instep 28 cm Vascular Assessment Pulses: Dorsalis Pedis Palpable: [Right:Yes]  Electronic Signature(s) Signed: 08/04/2021 6:14:04 PM By: Zandra Abts RN, BSN Entered By: Zandra Abts on 08/04/2021 14:22:52 -------------------------------------------------------------------------------- Multi Wound Chart Details Patient Name: Date of Service: [name], [name] 08/04/2021 1:45 PM Medical Record Number: [id number] Patient Account Number: [id number] Date of Birth/Sex: Treating RN: 06-10-1936 (86 y.o. [name] Primary Care Catilyn Boggus: [name] Other Clinician: Referring Fouad Taul: Treating Everlie Eble/Extender: Wilmer Floor Weeks in Treatment: 3 Vital Signs Height(in): 70 Pulse(bpm): 64 Weight(lbs): 195 Blood Pressure(mmHg): 162/68 Body Mass Index(BMI): 28 Temperature(F): 98.7 Respiratory Rate(breaths/min): 18 Photos: [N/A:N/A] Right, Lateral Lower Leg Right, Posterior Lower Leg N/A Wound Location: Blister Blister N/A Wounding Event: Venous Leg Ulcer Venous Leg Ulcer N/A Primary Etiology: Coronary Artery Disease, Coronary Artery Disease, N/A Comorbid History: Hypertension, Myocardial Infarction, Hypertension, Myocardial  Infarction, Peripheral Venous Disease, Type II Peripheral Venous Disease, Type II Diabetes Diabetes 03/28/2021 03/28/2021 N/A Date Acquired: 3 3 N/A Weeks of Treatment: Open Open N/A Wound Status: No No N/A Wound Recurrence: 0.5x0.5x0.1 2.6x5.5x0.1 N/A Measurements L x W x D (cm) 0.196 11.231 N/A A (cm) : rea 0.02 1.123 N/A Volume (cm) : 99.50% 80.90% N/A % Reduction in Area: 99.70% 80.90% N/A % Reduction in Volume: Full Thickness Without Exposed Full Thickness Without Exposed N/A Classification: Support Structures Support Structures Medium Medium N/A Exudate Amount: Serosanguineous Serosanguineous N/A Exudate Type: red, brown red, brown N/A Exudate Color: Distinct, outline attached Distinct, outline attached N/A Wound Margin: Large (67-100%) Large (67-100%) N/A Granulation Amount: Red, Pink Red, Pink N/A Granulation Quality: Small (1-33%) Small (1-33%) N/A Necrotic Amount: Fat Layer (Subcutaneous Tissue): Yes Fat Layer (Subcutaneous Tissue): Yes N/A Exposed Structures: Fascia: No Fascia: No Tendon: No Tendon: No Muscle: No Muscle: No Joint: No Joint: No Bone: No Bone: No Medium (34-66%) Small (1-33%) N/A Epithelialization: Compression Therapy Compression Therapy N/A Procedures Performed: Treatment Notes Electronic Signature(s) Signed: 08/04/2021 5:02:53 PM By: Baltazar Najjar MD Signed: 08/04/2021 6:14:04 PM By: Zandra Abts RN, BSN Entered By: Baltazar Najjar on 08/04/2021 14:34:41 -------------------------------------------------------------------------------- Multi-Disciplinary Care Plan Details Patient Name: Date of Service: [name], [name] 08/04/2021 1:45 PM Medical Record Number: [id number] Patient Account Number: [id number] Date of Birth/Sex: Treating RN: 1936-03-26 (86 y.o. [name] Primary Care Mae Cianci: [name] Other Clinician: Referring Theus Espin: Treating Kelsea Mousel/Extender: Anabel Bene in  Treatment: 3 Multidisciplinary Care Plan reviewed with physician Active Inactive Venous Leg Ulcer Nursing Diagnoses: Knowledge deficit related to disease process and management Goals: Patient will maintain optimal edema control Date Initiated: 07/14/2021 Target Resolution Date: 08/14/2021 Goal Status: Active Patient/caregiver will verbalize understanding of disease process and disease management Date Initiated: 07/14/2021 Target Resolution Date: 08/14/2021 Goal Status: Active Interventions: Assess peripheral edema status every visit. Compression as ordered Provide education on venous insufficiency Notes: Wound/Skin Impairment Nursing Diagnoses: Impaired tissue integrity Knowledge deficit related to ulceration/compromised skin integrity Goals: Patient/caregiver will verbalize understanding of skin care regimen Date Initiated: 07/14/2021 Target Resolution Date: 08/14/2021 Goal Status: Active Ulcer/skin breakdown will have a volume reduction of 30% by week 4 Date Initiated: 07/14/2021 Target Resolution Date: 08/14/2021 Goal Status: Active Interventions: Assess patient/caregiver ability to obtain necessary supplies Assess patient/caregiver ability to perform ulcer/skin care regimen upon admission and as needed Assess ulceration(s) every visit Provide education on ulcer and skin care Notes: Electronic Signature(s) Signed: 08/04/2021 6:14:04 PM By: Zandra Abts RN, BSN Entered By: Zandra Abts on 08/04/2021 17:35:27 -------------------------------------------------------------------------------- Pain Assessment Details Patient Name: Date of Service: [name], [name] 08/04/2021 1:45 PM Medical  Record Number: [id number] Patient Account Number: [id number] Date of Birth/Sex: Treating RN: July 01, 1935 (86 y.o. [name] Primary Care Olanda Downie: [name] Other Clinician: Referring Noralyn Karim: Treating Keiyon Plack/Extender: Anabel Bene in  Treatment: 3 Active Problems Location of Pain Severity and Description of Pain Patient Has Paino No Site Locations Pain Management and Medication Current Pain Management: Electronic Signature(s) Signed: 08/04/2021 6:14:04 PM By: Zandra Abts RN, BSN Signed: 08/05/2021 2:07:25 PM By: Karl Ito Entered By: Karl Ito on 08/04/2021 13:52:55 -------------------------------------------------------------------------------- Patient/Caregiver Education Details Patient Name: Date of Service: [name], Chestine Spore 2/7/2023andnbsp1:45 PM Medical Record Number: [id number] Patient Account Number: [id number] Date of Birth/Gender: Treating RN: 10/14/1935 (86 y.o. [name] Primary Care Physician: [name] Other Clinician: Referring Physician: Treating Physician/Extender: Anabel Bene in Treatment: 3 Education Assessment Education Provided To: Patient Education Topics Provided Wound/Skin Impairment: Methods: Explain/Verbal Responses: State content correctly Electronic Signature(s) Signed: 08/04/2021 6:14:04 PM By: Zandra Abts RN, BSN Entered By: Zandra Abts on 08/04/2021 17:36:29 -------------------------------------------------------------------------------- Wound Assessment Details Patient Name: Date of Service: [name], [name] 08/04/2021 1:45 PM Medical Record Number: [id number] Patient Account Number: [id number] Date of Birth/Sex: Treating RN: 05-23-1936 (86 y.o. [name] Primary Care Leyli Kevorkian: [name] Other Clinician: Referring Shaymus Eveleth: Treating Shishir Krantz/Extender: Wilmer Floor Weeks in Treatment: 3 Wound Status Wound Number: 1 Primary Venous Leg Ulcer Etiology: Wound Location: Right, Lateral Lower Leg Wound Open Wounding Event: Blister Status: Date Acquired: 03/28/2021 Comorbid Coronary Artery Disease, Hypertension, Myocardial Infarction, Weeks Of Treatment: 3 History: Peripheral  Venous Disease, Type II Diabetes Clustered Wound: No Photos Wound Measurements Length: (cm) 0.5 Width: (cm) 0.5 Depth: (cm) 0.1 Area: (cm) 0.196 Volume: (cm) 0.02 % Reduction in Area: 99.5% % Reduction in Volume: 99.7% Epithelialization: Medium (34-66%) Tunneling: No Undermining: No Wound Description Classification: Full Thickness Without Exposed Support Structures Wound Margin: Distinct, outline attached Exudate Amount: Medium Exudate Type: Serosanguineous Exudate Color: red, brown Foul Odor After Cleansing: No Slough/Fibrino Yes Wound Bed Granulation Amount: Large (67-100%) Exposed Structure Granulation Quality: Red, Pink Fascia Exposed: No Necrotic Amount: Small (1-33%) Fat Layer (Subcutaneous Tissue) Exposed: Yes Necrotic Quality: Adherent Slough Tendon Exposed: No Muscle Exposed: No Joint Exposed: No Bone Exposed: No Treatment Notes Wound #1 (Lower Leg) Wound Laterality: Right, Lateral Cleanser Soap and Water Discharge Instruction: May shower and wash wound with dial antibacterial soap and water prior to dressing change. Wound Cleanser Discharge Instruction: Cleanse the wound with wound cleanser prior to applying a clean dressing using gauze sponges, not tissue or cotton balls. Peri-Wound Care Triamcinolone 15 (g) Discharge Instruction: Use triamcinolone 15 (g) as directed Sween Lotion (Moisturizing lotion) Discharge Instruction: Apply moisturizing lotion as directed Topical Primary Dressing KerraCel Ag Gelling Fiber Dressing, 4x5 in (silver alginate) Discharge Instruction: Apply silver alginate to wound bed as instructed Secondary Dressing ABD Pad, 8x10 Discharge Instruction: Apply over primary dressing as directed. Secured With Compression Wrap ThreePress (3 layer compression wrap) Discharge Instruction: Apply three layer compression as directed. Compression Stockings Add-Ons Electronic Signature(s) Signed: 08/04/2021 6:14:04 PM By: Zandra Abts RN,  BSN Entered By: Zandra Abts on 08/04/2021 14:23:16 -------------------------------------------------------------------------------- Wound Assessment Details Patient Name: Date of Service: [name], [name] 08/04/2021 1:45 PM Medical Record Number: [id number] Patient Account Number: [id number] Date of Birth/Sex: Treating RN: 12-Nov-1935 (86 y.o. [name] Primary Care Shaivi Rothschild: [name] Other Clinician: Referring Jennika Ringgold: Treating Rahkeem Senft/Extender: Wilmer Floor Weeks in Treatment: 3 Wound Status Wound Number: 3  Primary Venous Leg Ulcer Etiology: Wound Location: Right, Posterior Lower Leg Wound Open Wounding Event: Blister Status: Date Acquired: 03/28/2021 Comorbid Coronary Artery Disease, Hypertension, Myocardial Infarction, Weeks Of Treatment: 3 History: Peripheral Venous Disease, Type II Diabetes Clustered Wound: No Photos Wound Measurements Length: (cm) 2.6 Width: (cm) 5.5 Depth: (cm) 0.1 Area: (cm) 11.231 Volume: (cm) 1.123 % Reduction in Area: 80.9% % Reduction in Volume: 80.9% Epithelialization: Small (1-33%) Tunneling: No Undermining: No Wound Description Classification: Full Thickness Without Exposed Support Structures Wound Margin: Distinct, outline attached Exudate Amount: Medium Exudate Type: Serosanguineous Exudate Color: red, brown Foul Odor After Cleansing: No Slough/Fibrino Yes Wound Bed Granulation Amount: Large (67-100%) Exposed Structure Granulation Quality: Red, Pink Fascia Exposed: No Necrotic Amount: Small (1-33%) Fat Layer (Subcutaneous Tissue) Exposed: Yes Necrotic Quality: Adherent Slough Tendon Exposed: No Muscle Exposed: No Joint Exposed: No Bone Exposed: No Treatment Notes Wound #3 (Lower Leg) Wound Laterality: Right, Posterior Cleanser Soap and Water Discharge Instruction: May shower and wash wound with dial antibacterial soap and water prior to dressing change. Wound Cleanser Discharge  Instruction: Cleanse the wound with wound cleanser prior to applying a clean dressing using gauze sponges, not tissue or cotton balls. Peri-Wound Care Triamcinolone 15 (g) Discharge Instruction: Use triamcinolone 15 (g) as directed Sween Lotion (Moisturizing lotion) Discharge Instruction: Apply moisturizing lotion as directed Topical Primary Dressing KerraCel Ag Gelling Fiber Dressing, 4x5 in (silver alginate) Discharge Instruction: Apply silver alginate to wound bed as instructed Secondary Dressing ABD Pad, 8x10 Discharge Instruction: Apply over primary dressing as directed. Secured With Compression Wrap ThreePress (3 layer compression wrap) Discharge Instruction: Apply three layer compression as directed. Compression Stockings Add-Ons Electronic Signature(s) Signed: 08/04/2021 6:14:04 PM By: Zandra Abts RN, BSN Signed: 08/04/2021 6:14:04 PM By: Zandra Abts RN, BSN Entered By: Zandra Abts on 08/04/2021 14:23:46 -------------------------------------------------------------------------------- Vitals Details Patient Name: Date of Service: [name], [name] 08/04/2021 1:45 PM Medical Record Number: [id number] Patient Account Number: [id number] Date of Birth/Sex: Treating RN: [birth year] (86 y.o. [name] Primary Care Luka Reisch: [name] Other Clinician: Referring Ebrima Ranta: Treating Gitel Beste/Extender: Anabel Bene in Treatment: 3 Vital Signs Time Taken: 13:52 Temperature (F): 98.7 Height (in): 70 Pulse (bpm): 64 Weight (lbs): 195 Respiratory Rate (breaths/min): 18 Body Mass Index (BMI): 28 Blood Pressure (mmHg): 162/68 Reference Range: 80 - 120 mg / dl Electronic Signature(s) Signed: 08/05/2021 2:07:25 PM By: Karl Ito Entered By: Karl Ito on 08/04/2021 13:52:34

## 2021-08-11 ENCOUNTER — Encounter (HOSPITAL_BASED_OUTPATIENT_CLINIC_OR_DEPARTMENT_OTHER): Payer: Medicare Other | Admitting: Internal Medicine

## 2021-08-11 ENCOUNTER — Other Ambulatory Visit: Payer: Self-pay

## 2021-08-11 DIAGNOSIS — I87331 Chronic venous hypertension (idiopathic) with ulcer and inflammation of right lower extremity: Secondary | ICD-10-CM | POA: Diagnosis not present

## 2021-08-12 NOTE — Progress Notes (Signed)
Harrie ForemanCOPPADGE, Lequan L. ([id number]) Visit Report for 08/11/2021 HPI Details Patient Name: Date of Service: [name], JO [place] L. 08/11/2021 9:30 A M Medical Record Number: [id number] Patient Account Number: [id number] Date of Birth/Sex: Treating RN: 1935/12/29 (86 y.o. Dianna LimboM) Scotton, Joanne Primary Care Provider: Earlie LouGarba, Mohammad Other Clinician: Referring Provider: Treating Provider/Extender: [name], Georgeanna Radziewicz Garba, Mohammad Weeks in Treatment: 4 History of Present Illness HPI Description: ADMISSION 07/14/2021 This is a pleasant 86 year old man who arrived in clinic today accompanied by his granddaughter. He has had 3 wounds on the right leg over the last 2 months or so. Initially started as blisters that opened and increasing edema in the right leg. He has 3 open areas 1 right lateral 1 right medial and 1 right posterior. He was in the ER on 06/30/2021 and had topical bacitracin to apply to the wound areas. Was seen 2 days later by his primary doctor in addition to the doxycycline that he was given by the ER he was put on Augmentin. He is finished both of these antibiotics. He has worn compression stockings or may be just support stockings until very recently although they were about a year old, [facility] Past medical history includes gallstone pancreatitis, hypertension, hyperlipidemia, type 2 diabetes with last hemoglobin A1c in epic 3 years ago, coronary artery disease. He is still a cigarette smoker. ABI in our clinic was 0.72 on the right although this may have been hampered by the amount of edema in the leg 1/24; patient's wounds look a lot alert. He has edema can roll in the right leg with 3 layer compression. He also has severe venous insufficiency. His ABIs in our clinic were 0.72, I may check this next week when we have better edema can we do not yet have an appointment for arterial Dopplers 1/31; we rechecked his ABIs still at 0.7. We still do not have an appointment for  arterial Dopplers. We have been using 3 layer compression silver alginate. Most of this is severe chronic venous insufficiency and I think these are predominantly venous insufficiency wounds 2/7; the patient is finally gotten his vascular studies done and on the right he had an ABI of 0.56 with monophasic waveforms with a great toe pressure of only 0.2 on the left ABI of 0.84. The PTA could not be heard monophasic waveforms with a great toe pressure of 0.49. This is indicative of significant PAD although within the amount of walking the patient does I am unable to elicit a history of claudication 2/14; patient has not seen vascular surgery in follow-up of his arterial insufficiency however his predominantly venous wounds are closing. He still has an open area on the right posterior lower leg/calf however it is superficial and look like it is epithelializing. We have been using 3 layer compression Electronic Signature(s) Signed: 08/11/2021 4:15:21 PM By: Baltazar Najjarobson, Breely Panik MD Entered By: Baltazar Najjarobson, Kaycie Pegues on 08/11/2021 11:13:53 -------------------------------------------------------------------------------- Physical Exam Details Patient Name: Date of Service: [name], [name] 08/11/2021 9:30 A M Medical Record Number: [id number] Patient Account Number: [id number] Date of Birth/Sex: Treating RN: 1935/12/29 (86 y.o. Dianna LimboM) Scotton, Joanne Primary Care Provider: Earlie LouGarba, Mohammad Other Clinician: Referring Provider: Treating Provider/Extender: [name], Kimberla Driskill Garba, Mohammad Weeks in Treatment: 4 Constitutional Patient is hypertensive.. Pulse regular and within target range for patient.Marland Kitchen. Respirations regular, non-labored and within target range.. Temperature is normal and within the target range for the patient.Marland Kitchen. Appears in no distress. Cardiovascular Pedal pulses are difficult to feel. Notes  Wound exam; right lateral leg wound is closed. He has a small open area remaining on the right posterior calf  just above the Achilles area. He has severe venous hypertension. Electronic Signature(s) Signed: 08/11/2021 4:15:21 PM By: Baltazar Najjar MD Entered By: Baltazar Najjar on 08/11/2021 11:16:08 -------------------------------------------------------------------------------- Physician Orders Details Patient Name: Date of Service: [name], [name] 08/11/2021 9:30 A M Medical Record Number: [id number] Patient Account Number: [id number] Date of Birth/Sex: Treating RN: 11-Jul-1935 (86 y.o. Dianna Limbo Primary Care Provider: [name] Other Clinician: Referring Provider: Treating Provider/Extender: Anabel Bene in Treatment: 4 Verbal / Phone Orders: No Diagnosis Coding Follow-up Appointments ppointment in 1 week. - Dr. Leanord Hawking Return A Bathing/ Shower/ Hygiene May shower with protection but do not get wound dressing(s) wet. - Ok to use Arts development officer, can purchase at [facility], [facility], or [employer] Edema Control - Lymphedema / SCD / Other Elevate legs to the level of the heart or above for 30 minutes daily and/or when sitting, a frequency of: - throughout the day Avoid standing for long periods of time. Exercise regularly Moisturize legs daily. - left leg daily Home Health Discontinue home health for wound care. - [facility] Wound #3 - Lower Leg Wound Laterality: Right, Posterior Cleanser: Soap and Water 1 x Per Week/7 Days Discharge Instructions: May shower and wash wound with dial antibacterial soap and water prior to dressing change. Cleanser: Wound Cleanser 1 x Per Week/7 Days Discharge Instructions: Cleanse the wound with wound cleanser prior to applying a clean dressing using gauze sponges, not tissue or cotton balls. Peri-Wound Care: Triamcinolone 15 (g) 1 x Per Week/7 Days Discharge Instructions: Use triamcinolone 15 (g) as directed Peri-Wound Care: Sween Lotion (Moisturizing lotion) 1 x Per Week/7 Days Discharge Instructions:  Apply moisturizing lotion as directed Prim Dressing: KerraCel Ag Gelling Fiber Dressing, 4x5 in (silver alginate) 1 x Per Week/7 Days ary Discharge Instructions: Apply silver alginate to wound bed as instructed Secondary Dressing: ABD Pad, 8x10 1 x Per Week/7 Days Discharge Instructions: Apply over primary dressing as directed. Compression Wrap: ThreePress (3 layer compression wrap) 1 x Per Week/7 Days Discharge Instructions: Apply three layer compression as directed. Compression Stockings: Circaid Juxta Lite Compression Wrap (DME) Left Leg Compression Amount: 20-30 mmHG Right Leg Compression Amount: 20-30 mmHG Discharge Instructions: Apply Circaid Juxta Lite Compression Wrap daily as instructed. Apply first thing in the morning, remove at night before bed. Electronic Signature(s) Signed: 08/11/2021 5:20:25 PM By: Karie Schwalbe RN Signed: 08/12/2021 4:36:32 PM By: Baltazar Najjar MD Previous Signature: 08/11/2021 4:15:21 PM Version By: Baltazar Najjar MD Entered By: Karie Schwalbe on 08/11/2021 16:46:55 -------------------------------------------------------------------------------- Problem List Details Patient Name: Date of Service: [name], [name] 08/11/2021 9:30 A M Medical Record Number: [id number] Patient Account Number: [id number] Date of Birth/Sex: Treating RN: 1936-04-01 (86 y.o. Dianna Limbo Primary Care Provider: [name] Other Clinician: Referring Provider: Treating Provider/Extender: Anabel Bene in Treatment: 4 Active Problems ICD-10 Encounter Code Description Active Date MDM Diagnosis L97.818 Non-pressure chronic ulcer of other part of right lower leg with other specified 07/14/2021 No Yes severity I87.331 Chronic venous hypertension (idiopathic) with ulcer and inflammation of right 07/14/2021 No Yes lower extremity I89.0 Lymphedema, not elsewhere classified 07/14/2021 No Yes E11.622 Type 2 diabetes mellitus with other skin  ulcer 07/14/2021 No Yes Inactive Problems Resolved Problems Electronic Signature(s) Signed: 08/11/2021 4:15:21 PM By: Baltazar Najjar MD Entered By: Baltazar Najjar on 08/11/2021 11:12:41 -------------------------------------------------------------------------------- Progress Note  Details Patient Name: Date of Service: [name], [name] 08/11/2021 9:30 A M Medical Record Number: [id number] Patient Account Number: [id number] Date of Birth/Sex: Treating RN: Dec 07, 1935 (86 y.o. Dianna Limbo Primary Care Provider: [name] Other Clinician: Referring Provider: Treating Provider/Extender: Anabel Bene in Treatment: 4 Subjective History of Present Illness (HPI) ADMISSION 07/14/2021 This is a pleasant 86 year old man who arrived in clinic today accompanied by his granddaughter. He has had 3 wounds on the right leg over the last 2 months or so. Initially started as blisters that opened and increasing edema in the right leg. He has 3 open areas 1 right lateral 1 right medial and 1 right posterior. He was in the ER on 06/30/2021 and had topical bacitracin to apply to the wound areas. Was seen 2 days later by his primary doctor in addition to the doxycycline that he was given by the ER he was put on Augmentin. He is finished both of these antibiotics. He has worn compression stockings or may be just support stockings until very recently although they were about a year old, his son got them at [facility]. Past medical history includes gallstone pancreatitis, hypertension, hyperlipidemia, type 2 diabetes with last hemoglobin A1c in epic 3 years ago, coronary artery disease. He is still a cigarette smoker. ABI in our clinic was 0.72 on the right although this may have been hampered by the amount of edema in the leg 1/24; patient's wounds look a lot alert. He has edema can roll in the right leg with 3 layer compression. He also has severe venous insufficiency. His ABIs  in our clinic were 0.72, I may check this next week when we have better edema can we do not yet have an appointment for arterial Dopplers 1/31; we rechecked his ABIs still at 0.7. We still do not have an appointment for arterial Dopplers. We have been using 3 layer compression silver alginate. Most of this is severe chronic venous insufficiency and I think these are predominantly venous insufficiency wounds 2/7; the patient is finally gotten his vascular studies done and on the right he had an ABI of 0.56 with monophasic waveforms with a great toe pressure of only 0.2 on the left ABI of 0.84. The PTA could not be heard monophasic waveforms with a great toe pressure of 0.49. This is indicative of significant PAD although within the amount of walking the patient does I am unable to elicit a history of claudication 2/14; patient has not seen vascular surgery in follow-up of his arterial insufficiency however his predominantly venous wounds are closing. He still has an open area on the right posterior lower leg/calf however it is superficial and look like it is epithelializing. We have been using 3 layer compression Objective Constitutional Patient is hypertensive.. Pulse regular and within target range for patient.Marland Kitchen Respirations regular, non-labored and within target range.. Temperature is normal and within the target range for the patient.Marland Kitchen Appears in no distress. Vitals Time Taken: 9:45 AM, Height: 70 in, Weight: 195 lbs, BMI: 28, Temperature: 98.8 F, Pulse: 66 bpm, Respiratory Rate: 18 breaths/min, Blood Pressure: 180/86 mmHg. Cardiovascular Pedal pulses are difficult to feel. General Notes: Wound exam; right lateral leg wound is closed. He has a small open area remaining on the right posterior calf just above the Achilles area. He has severe venous hypertension. Integumentary (Hair, Skin) Wound #1 status is Healed - Epithelialized. Original cause of wound was Blister. The date acquired was:  03/28/2021. The wound  has been in treatment 4 weeks. The wound is located on the Right,Lateral Lower Leg. The wound measures 0cm length x 0cm width x 0cm depth; 0cm^2 area and 0cm^3 volume. There is no tunneling or undermining noted. There is a none present amount of drainage noted. The wound margin is distinct with the outline attached to the wound base. There is no granulation within the wound bed. There is no necrotic tissue within the wound bed. Wound #3 status is Open. Original cause of wound was Blister. The date acquired was: 03/28/2021. The wound has been in treatment 4 weeks. The wound is located on the Right,Posterior Lower Leg. The wound measures 1cm length x 2.2cm width x 0.1cm depth; 1.728cm^2 area and 0.173cm^3 volume. There is Fat Layer (Subcutaneous Tissue) exposed. There is a medium amount of serosanguineous drainage noted. The wound margin is distinct with the outline attached to the wound base. There is large (67-100%) red, pink granulation within the wound bed. There is a small (1-33%) amount of necrotic tissue within the wound bed including Adherent Slough. Assessment Active Problems ICD-10 Non-pressure chronic ulcer of other part of right lower leg with other specified severity Chronic venous hypertension (idiopathic) with ulcer and inflammation of right lower extremity Lymphedema, not elsewhere classified Type 2 diabetes mellitus with other skin ulcer Procedures Wound #3 Pre-procedure diagnosis of Wound #3 is a Venous Leg Ulcer located on the Right,Posterior Lower Leg . There was a Three Layer Compression Therapy Procedure by Karie Schwalbe, RN. Post procedure Diagnosis Wound #3: Same as Pre-Procedure Plan Follow-up Appointments: Return Appointment in 1 week. - Dr. Leanord Hawking Bathing/ Shower/ Hygiene: May shower with protection but do not get wound dressing(s) wet. - Ok to use Arts development officer, can purchase at [facility], [facility], or [employer] Edema Control - Lymphedema / SCD /  Other: Elevate legs to the level of the heart or above for 30 minutes daily and/or when sitting, a frequency of: - throughout the day Avoid standing for long periods of time. Exercise regularly Moisturize legs daily. - left leg daily Home Health: Discontinue home health for wound care. - [facility] WOUND #3: - Lower Leg Wound Laterality: Right, Posterior Cleanser: Soap and Water 1 x Per Week/7 Days Discharge Instructions: May shower and wash wound with dial antibacterial soap and water prior to dressing change. Cleanser: Wound Cleanser 1 x Per Week/7 Days Discharge Instructions: Cleanse the wound with wound cleanser prior to applying a clean dressing using gauze sponges, not tissue or cotton balls. Peri-Wound Care: Triamcinolone 15 (g) 1 x Per Week/7 Days Discharge Instructions: Use triamcinolone 15 (g) as directed Peri-Wound Care: Sween Lotion (Moisturizing lotion) 1 x Per Week/7 Days Discharge Instructions: Apply moisturizing lotion as directed Prim Dressing: KerraCel Ag Gelling Fiber Dressing, 4x5 in (silver alginate) 1 x Per Week/7 Days ary Discharge Instructions: Apply silver alginate to wound bed as instructed Secondary Dressing: ABD Pad, 8x10 1 x Per Week/7 Days Discharge Instructions: Apply over primary dressing as directed. Com pression Wrap: ThreePress (3 layer compression wrap) 1 x Per Week/7 Days Discharge Instructions: Apply three layer compression as directed. Com pression Stockings: Circaid Juxta Lite Compression Wrap Compression Amount: 20-30 mmHg (left) Compression Amount: 20-30 mmHg (right) Discharge Instructions: Apply Circaid Juxta Lite Compression Wrap daily as instructed. Apply first thing in the morning, remove at night before bed. 1. Still using silver alginate as the primary dressing ABDs under 3 layer compression which she is tolerating 2. He is to see vascular surgery with regards to very significant arterial insufficiency  however his wounds per se are mostly  venous and are closing. He will need juxta lite stockings bilaterally which we have gone ahead and ordered today. 3. I cannot get a history that suggest claudication here although I think his activity level is very limited Electronic Signature(s) Signed: 08/11/2021 4:15:21 PM By: Baltazar Najjarobson, Ercole Georg MD Entered By: Baltazar Najjarobson, Zhana Jeangilles on 08/11/2021 11:17:20 -------------------------------------------------------------------------------- SuperBill Details Patient Name: Date of Service: [name], [name] 08/11/2021 Medical Record Number: [id number] Patient Account Number: [id number] Date of Birth/Sex: Treating RN: [birth year] (86 y.o. Dianna LimboM) Scotton, Joanne Primary Care Provider: Earlie LouGarba, Mohammad Other Clinician: Referring Provider: Treating Provider/Extender: [name], Lachae Hohler Garba, Mohammad Weeks in Treatment: 4 Diagnosis Coding ICD-10 Codes Code Description [phone] Non-pressure chronic ulcer of other part of right lower leg with other specified severity I87.331 Chronic venous hypertension (idiopathic) with ulcer and inflammation of right lower extremity I89.0 Lymphedema, not elsewhere classified E11.622 Type 2 diabetes mellitus with other skin ulcer Facility Procedures CPT4 Code: [id number] Description: (Facility Use Only) [id number] - APPLY MULTLAY COMPRS LWR RT LEG Modifier: Quantity: 1 Physician Procedures : CPT4 Code Description Modifier [id number] - WC PHYS LEVEL 3 - EST PT ICD-10 Diagnosis Description L97.818 Non-pressure chronic ulcer of other part of right lower leg with other specified severity I87.331 Chronic venous hypertension (idiopathic) with  ulcer and inflammation of right lower extremity I89.0 Lymphedema, not elsewhere classified Quantity: 1 Electronic Signature(s) Signed: 08/11/2021 5:20:25 PM By: Karie SchwalbeScotton, Joanne RN Signed: 08/12/2021 4:36:32 PM By: Baltazar Najjarobson, Darlys Buis MD Previous Signature: 08/11/2021 4:15:21 PM Version By: Baltazar Najjarobson, Alin Hutchins MD Entered By: Karie SchwalbeScotton, Joanne on  08/11/2021 16:25:47

## 2021-08-18 ENCOUNTER — Other Ambulatory Visit: Payer: Self-pay

## 2021-08-18 ENCOUNTER — Encounter (HOSPITAL_BASED_OUTPATIENT_CLINIC_OR_DEPARTMENT_OTHER): Payer: Medicare Other | Admitting: Internal Medicine

## 2021-08-18 DIAGNOSIS — I87331 Chronic venous hypertension (idiopathic) with ulcer and inflammation of right lower extremity: Secondary | ICD-10-CM | POA: Diagnosis not present

## 2021-08-18 NOTE — Progress Notes (Signed)
LY, BACCHI ([id number]) Visit Report for 08/18/2021 HPI Details Patient Name: Date of Service: [name], [name] 08/18/2021 1:30 PM Medical Record Number: [id number] Patient Account Number: [id number] Date of Birth/Sex: Treating RN: 1936/04/06 (86 y.o. [name] Primary Care Provider: [name] Other Clinician: Referring Provider: Treating Provider/Extender: Anabel Bene in Treatment: 5 History of Present Illness HPI Description: ADMISSION 07/14/2021 This is a pleasant 86 year old man who arrived in clinic today accompanied by his granddaughter. He has had 3 wounds on the right leg over the last 2 months or so. Initially started as blisters that opened and increasing edema in the right leg. He has 3 open areas 1 right lateral 1 right medial and 1 right posterior. He was in the ER on 06/30/2021 and had topical bacitracin to apply to the wound areas. Was seen 2 days later by his primary doctor in addition to the doxycycline that he was given by the ER he was put on Augmentin. He is finished both of these antibiotics. He has worn compression stockings or may be just support stockings until very recently although they were about a year old, his son got them at [facility]. Past medical history includes gallstone pancreatitis, hypertension, hyperlipidemia, type 2 diabetes with last hemoglobin A1c in epic 3 years ago, coronary artery disease. He is still a cigarette smoker. ABI in our clinic was 0.72 on the right although this may have been hampered by the amount of edema in the leg 1/24; patient's wounds look a lot alert. He has edema can roll in the right leg with 3 layer compression. He also has severe venous insufficiency. His ABIs in our clinic were 0.72, I may check this next week when we have better edema can we do not yet have an appointment for arterial Dopplers 1/31; we rechecked his ABIs still at 0.7. We still do not have an appointment for arterial  Dopplers. We have been using 3 layer compression silver alginate. Most of this is severe chronic venous insufficiency and I think these are predominantly venous insufficiency wounds 2/7; the patient is finally gotten his vascular studies done and on the right he had an ABI of 0.56 with monophasic waveforms with a great toe pressure of only 0.2 on the left ABI of 0.84. The PTA could not be heard monophasic waveforms with a great toe pressure of 0.49. This is indicative of significant PAD although within the amount of walking the patient does I am unable to elicit a history of claudication 2/14; patient has not seen vascular surgery in follow-up of his arterial insufficiency however his predominantly venous wounds are closing. He still has an open area on the right posterior lower leg/calf however it is superficial and look like it is epithelializing. We have been using 3 layer compression 2/21; everything is closed. No open areas on the right leg. Severe chronic venous insufficiency. He probably has PAD as well as indicated by our noninvasive studies however his wounds have heealed and they were predominantly venous in etiology. We still have not heard from vein and vascular. He has a juxta light stocking for both legs but unfortunately they did not bring them today Electronic Signature(s) Signed: 08/18/2021 4:44:59 PM By: Baltazar Najjar MD Entered By: Baltazar Najjar on 08/18/2021 14:02:34 -------------------------------------------------------------------------------- Physical Exam Details Patient Name: Date of Service: [name], [name] 08/18/2021 1:30 PM Medical Record Number: [id number] Patient Account Number: [id number] Date of Birth/Sex: Treating RN: [birth year] (86 y.o. M)  Zandra Abts Primary Care Provider: [name] Other Clinician: Referring Provider: Treating Provider/Extender: Anabel Bene in Treatment: 5 Constitutional Patient is hypertensive..  Pulse regular and within target range for patient.Marland Kitchen Respirations regular, non-labored and within target range.. Temperature is normal and within the target range for the patient.Marland Kitchen Appears in no distress. Notes exam; right lateral lower leg wound is closed posteriorly there is nothing open either. Severe changes of chronic venous insufficiency. His wounds have healed. Peripheral pulses in the foot are not palpable Electronic Signature(s) Signed: 08/18/2021 4:44:59 PM By: Baltazar Najjar MD Entered By: Baltazar Najjar on 08/18/2021 14:03:56 -------------------------------------------------------------------------------- Physician Orders Details Patient Name: Date of Service: [name], [name] 08/18/2021 1:30 PM Medical Record Number: [id number] Patient Account Number: [id number] Date of Birth/Sex: Treating RN: Jun 24, 1936 (86 y.o. [name] Primary Care Provider: [name] Other Clinician: Referring Provider: Treating Provider/Extender: Anabel Bene in Treatment: 5 Verbal / Phone Orders: No Diagnosis Coding ICD-10 Coding Code Description [phone] Non-pressure chronic ulcer of other part of right lower leg with other specified severity I87.331 Chronic venous hypertension (idiopathic) with ulcer and inflammation of right lower extremity I89.0 Lymphedema, not elsewhere classified E11.622 Type 2 diabetes mellitus with other skin ulcer Follow-up Appointments Nurse Visit: - Friday 2/24 to show how to apply Juxtalite, discharge if wound is still healed. Bathing/ Shower/ Hygiene May shower with protection but do not get wound dressing(s) wet. - Ok to use Arts development officer, can purchase at [facility], [facility], or [employer] Edema Control - Lymphedema / SCD / Other Elevate legs to the level of the heart or above for 30 minutes daily and/or when sitting, a frequency of: - throughout the day Avoid standing for long periods of time. Exercise regularly Moisturize legs  daily. - left leg daily Compression stocking or Garment 30-40 mm/Hg pressure to: - Both legs daily Other Edema Control Orders/Instructions: - Apply lotion and 3 layer compression to right leg. Transition to [facility] on next nurse visit. Electronic Signature(s) Signed: 08/18/2021 4:44:59 PM By: Baltazar Najjar MD Signed: 08/18/2021 6:14:30 PM By: Zandra Abts RN, BSN Entered By: Zandra Abts on 08/18/2021 14:07:13 -------------------------------------------------------------------------------- Problem List Details Patient Name: Date of Service: [name], [name] 08/18/2021 1:30 PM Medical Record Number: [id number] Patient Account Number: [id number] Date of Birth/Sex: Treating RN: 03/01/1936 (86 y.o. [name] Primary Care Provider: [name] Other Clinician: Referring Provider: Treating Provider/Extender: Anabel Bene in Treatment: 5 Active Problems ICD-10 Encounter Code Description Active Date MDM Diagnosis L97.818 Non-pressure chronic ulcer of other part of right lower leg with other specified 07/14/2021 No Yes severity I87.331 Chronic venous hypertension (idiopathic) with ulcer and inflammation of right 07/14/2021 No Yes lower extremity I89.0 Lymphedema, not elsewhere classified 07/14/2021 No Yes E11.622 Type 2 diabetes mellitus with other skin ulcer 07/14/2021 No Yes Inactive Problems Resolved Problems Electronic Signature(s) Signed: 08/18/2021 4:44:59 PM By: Baltazar Najjar MD Entered By: Baltazar Najjar on 08/18/2021 14:01:27 -------------------------------------------------------------------------------- Progress Note Details Patient Name: Date of Service: [name], [name] 08/18/2021 1:30 PM Medical Record Number: [id number] Patient Account Number: [id number] Date of Birth/Sex: Treating RN: 07/28/1935 (86 y.o. [name] Primary Care Provider: [name] Other Clinician: Referring Provider: Treating  Provider/Extender: Anabel Bene in Treatment: 5 Subjective History of Present Illness (HPI) ADMISSION 07/14/2021 This is a pleasant 86 year old man who arrived in clinic today accompanied by his granddaughter. He has had 3 wounds on the right leg over  the last 2 months or so. Initially started as blisters that opened and increasing edema in the right leg. He has 3 open areas 1 right lateral 1 right medial and 1 right posterior. He was in the ER on 06/30/2021 and had topical bacitracin to apply to the wound areas. Was seen 2 days later by his primary doctor in addition to the doxycycline that he was given by the ER he was put on Augmentin. He is finished both of these antibiotics. He has worn compression stockings or may be just support stockings until very recently although they were about a year old, his son got them at [facility]. Past medical history includes gallstone pancreatitis, hypertension, hyperlipidemia, type 2 diabetes with last hemoglobin A1c in epic 3 years ago, coronary artery disease. He is still a cigarette smoker. ABI in our clinic was 0.72 on the right although this may have been hampered by the amount of edema in the leg 1/24; patient's wounds look a lot alert. He has edema can roll in the right leg with 3 layer compression. He also has severe venous insufficiency. His ABIs in our clinic were 0.72, I may check this next week when we have better edema can we do not yet have an appointment for arterial Dopplers 1/31; we rechecked his ABIs still at 0.7. We still do not have an appointment for arterial Dopplers. We have been using 3 layer compression silver alginate. Most of this is severe chronic venous insufficiency and I think these are predominantly venous insufficiency wounds 2/7; the patient is finally gotten his vascular studies done and on the right he had an ABI of 0.56 with monophasic waveforms with a great toe pressure of only 0.2 on the left ABI of  0.84. The PTA could not be heard monophasic waveforms with a great toe pressure of 0.49. This is indicative of significant PAD although within the amount of walking the patient does I am unable to elicit a history of claudication 2/14; patient has not seen vascular surgery in follow-up of his arterial insufficiency however his predominantly venous wounds are closing. He still has an open area on the right posterior lower leg/calf however it is superficial and look like it is epithelializing. We have been using 3 layer compression 2/21; everything is closed. No open areas on the right leg. Severe chronic venous insufficiency. He probably has PAD as well as indicated by our noninvasive studies however his wounds have heealed and they were predominantly venous in etiology. We still have not heard from vein and vascular. He has a juxta light stocking for both legs but unfortunately they did not bring them today Objective Constitutional Patient is hypertensive.. Pulse regular and within target range for patient.Marland Kitchen Respirations regular, non-labored and within target range.. Temperature is normal and within the target range for the patient.Marland Kitchen Appears in no distress. Vitals Time Taken: 1:39 PM, Height: 70 in, Weight: 195 lbs, BMI: 28, Temperature: 98.4 F, Pulse: 71 bpm, Respiratory Rate: 18 breaths/min, Blood Pressure: 162/78 mmHg. General Notes: exam; right lateral lower leg wound is closed posteriorly there is nothing open either. Severe changes of chronic venous insufficiency. His wounds have healed. Peripheral pulses in the foot are not palpable Integumentary (Hair, Skin) Wound #3 status is Open. Original cause of wound was Blister. The date acquired was: 03/28/2021. The wound has been in treatment 5 weeks. The wound is located on the Right,Posterior Lower Leg. The wound measures 0cm length x 0cm width x 0cm depth; 0cm^2 area and 0cm^3  volume. There is no tunneling or undermining noted. There is a none  present amount of drainage noted. The wound margin is distinct with the outline attached to the wound base. There is no granulation within the wound bed. There is no necrotic tissue within the wound bed. Assessment Active Problems ICD-10 Non-pressure chronic ulcer of other part of right lower leg with other specified severity Chronic venous hypertension (idiopathic) with ulcer and inflammation of right lower extremity Lymphedema, not elsewhere classified Type 2 diabetes mellitus with other skin ulcer Procedures There was a Three Layer Compression Therapy Procedure by Zandra AbtsLynch, Shatara, RN. Post procedure Diagnosis Wound #: Same as Pre-Procedure Plan Follow-up Appointments: Nurse Visit: - Friday 2/24 to show how to apply Juxtalite, discharge if wound is still healed. Bathing/ Shower/ Hygiene: May shower with protection but do not get wound dressing(s) wet. - Ok to use Arts development officercast protector, can purchase at [facility], [facility], or [employer] Edema Control - Lymphedema / SCD / Other: Elevate legs to the level of the heart or above for 30 minutes daily and/or when sitting, a frequency of: - throughout the day Avoid standing for long periods of time. Exercise regularly Moisturize legs daily. - left leg daily Compression stocking or Garment 30-40 mm/Hg pressure to: - Both legs daily Other Edema Control Orders/Instructions: - Apply lotion and 3 layer compression to right leg #1 the patient is healed #2 wounds were predominantly venous #3 he has PAD I did not get him in to see vascular surgery unfortunately. He does not seem to be symptomatic although I think his activity is limited. I could not pick up a history of claudication. It is doubtful that vascular would proceed with an angiogram in the absence of symptoms or an open wound. Nevertheless it might be beneficial for him to get into the system. #4 Patient will come in tomorrowfor instructions about using the juxtalite stockings Electronic  Signature(s) Signed: 08/18/2021 4:44:59 PM By: Baltazar Najjarobson, Shinika Estelle MD Entered By: Baltazar Najjarobson, Clydine Parkison on 08/18/2021 14:06:20 -------------------------------------------------------------------------------- SuperBill Details Patient Name: Date of Service: [name], [name] 08/18/2021 Medical Record Number: [id number] Patient Account Number: [id number] Date of Birth/Sex: Treating RN: 05-21-1936 (86 y.o. Allen KollerM) Lynch, Shatara Primary Care Provider: Earlie LouGarba, Mohammad Other Clinician: Referring Provider: Treating Provider/Extender: [name], Umeka Wrench Garba, Mohammad Weeks in Treatment: 5 Diagnosis Coding ICD-10 Codes Code Description [phone] Non-pressure chronic ulcer of other part of right lower leg with other specified severity I87.331 Chronic venous hypertension (idiopathic) with ulcer and inflammation of right lower extremity I89.0 Lymphedema, not elsewhere classified E11.622 Type 2 diabetes mellitus with other skin ulcer Facility Procedures CPT4 Code: [id number] Description: (Facility Use Only) [id number] - APPLY MULTLAY COMPRS LWR RT LEG Modifier: Quantity: 1 Physician Procedures : CPT4 Code Description Modifier [id number] - WC PHYS LEVEL 3 - EST PT ICD-10 Diagnosis Description L97.818 Non-pressure chronic ulcer of other part of right lower leg with other specified severity I87.331 Chronic venous hypertension (idiopathic) with  ulcer and inflammation of right lower extremity I89.0 Lymphedema, not elsewhere classified Quantity: 1 Electronic Signature(s) Signed: 08/18/2021 4:44:59 PM By: Baltazar Najjarobson, Scotti Motter MD Signed: 08/18/2021 6:14:30 PM By: Zandra AbtsLynch, Shatara RN, BSN Entered By: Zandra AbtsLynch, Shatara on 08/18/2021 16:10:30

## 2021-08-18 NOTE — Progress Notes (Signed)
[name], [name] ([id number]) Visit Report for 08/18/2021 Arrival Information Details Patient Name: Date of Service: [name], JO [place] L. 08/18/2021 1:30 PM Medical Record Number: [id number] Patient Account Number: [id number] Date of Birth/Sex: Treating RN: [birth year] (86 y.o. [name] Primary Care [name]: [name] Other Clinician: Referring Khamya Topp: Treating Otis Burress/Extender: Anabel Bene in Treatment: 5 Visit Information History Since Last Visit Added or deleted any medications: No Patient Arrived: Ambulatory Any new allergies or adverse reactions: No Arrival Time: 13:39 Had a fall or experienced change in No Accompanied By: family members activities of daily living that may affect Transfer Assistance: None risk of falls: Patient Identification Verified: Yes Signs or symptoms of abuse/neglect since last visito No Secondary Verification Process Completed: Yes Hospitalized since last visit: No Patient Requires Transmission-Based Precautions: No Implantable device outside of the clinic excluding No Patient Has Alerts: No cellular tissue based products placed in the center since last visit: Has Dressing in Place as Prescribed: Yes Has Compression in Place as Prescribed: Yes Pain Present Now: No Electronic Signature(s) Signed: 08/18/2021 6:14:30 PM By: Zandra Abts RN, BSN Entered By: Zandra Abts on 08/18/2021 13:40:13 -------------------------------------------------------------------------------- Compression Therapy Details Patient Name: Date of Service: [name], [name] 08/18/2021 1:30 PM Medical Record Number: [id number] Patient Account Number: [id number] Date of Birth/Sex: Treating RN: [birth year] (86 y.o. [name] Primary Care [name]: [name] Other Clinician: Referring Shatiqua Heroux: Treating Kyaire Gruenewald/Extender: Anabel Bene in Treatment: 5 Compression Therapy Performed for Wound  Assessment: NonWound Condition Lymphedema - Right Leg Performed By: Clinician Zandra Abts, RN Compression Type: Three Layer Post Procedure Diagnosis Same as Pre-procedure Electronic Signature(s) Signed: 08/18/2021 6:14:30 PM By: Zandra Abts RN, BSN Entered By: Zandra Abts on 08/18/2021 14:00:52 -------------------------------------------------------------------------------- Encounter Discharge Information Details Patient Name: Date of Service: [name], [name] 08/18/2021 1:30 PM Medical Record Number: [id number] Patient Account Number: [id number] Date of Birth/Sex: Treating RN: February 07, 1936 (86 y.o. [name] Primary Care Bonnell Placzek: [name] Other Clinician: Referring Diannah Rindfleisch: Treating Tomeca Helm/Extender: Anabel Bene in Treatment: 5 Encounter Discharge Information Items Discharge Condition: Stable Ambulatory Status: Ambulatory Discharge Destination: Home Transportation: Private Auto Accompanied By: family members Schedule Follow-up Appointment: Yes Clinical Summary of Care: Patient Declined Electronic Signature(s) Signed: 08/18/2021 6:14:30 PM By: Zandra Abts RN, BSN Entered By: Zandra Abts on 08/18/2021 16:11:17 -------------------------------------------------------------------------------- Lower Extremity Assessment Details Patient Name: Date of Service: [name], [name] 08/18/2021 1:30 PM Medical Record Number: [id number] Patient Account Number: [id number] Date of Birth/Sex: Treating RN: [birth year] (86 y.o. [name] Primary Care Katera Rybka: [name] Other Clinician: Referring Alvia Tory: Treating Khia Dieterich/Extender: Wilmer Floor Weeks in Treatment: 5 Edema Assessment Assessed: Kyra Searles: No] [Right: No] Edema: [Left: Ye] [Right: s] Calf Left: Right: Point of Measurement: 33 cm From Medial Instep 46.2 cm 39.2 cm Ankle Left: Right: Point of Measurement: 12 cm From Medial Instep  32.6 cm 30.2 cm Vascular Assessment Pulses: Dorsalis Pedis Palpable: [Left:Yes] [Right:Yes] Electronic Signature(s) Signed: 08/18/2021 6:14:30 PM By: Zandra Abts RN, BSN Entered By: Zandra Abts on 08/18/2021 13:48:59 -------------------------------------------------------------------------------- Multi Wound Chart Details Patient Name: Date of Service: [name], [name] 08/18/2021 1:30 PM Medical Record Number: [id number] Patient Account Number: [id number] Date of Birth/Sex: Treating RN: 1936/03/30 (86 y.o. [name] Primary Care Micca Matura: [name] Other Clinician: Referring Adian Jablonowski: Treating Syd Manges/Extender: Anabel Bene in Treatment: 5 Vital Signs Height(in): 70 Pulse(bpm): 71 Weight(lbs): 195 Blood  Pressure(mmHg): 162/78 Body Mass Index(BMI): 28 Temperature(F): 98.4 Respiratory Rate(breaths/min): 18 Photos: [N/A:N/A] Right, Posterior Lower Leg N/A N/A Wound Location: Blister N/A N/A Wounding Event: Venous Leg Ulcer N/A N/A Primary Etiology: Coronary Artery Disease, N/A N/A Comorbid History: Hypertension, Myocardial Infarction, Peripheral Venous Disease, Type II Diabetes 03/28/2021 N/A N/A Date Acquired: 5 N/A N/A Weeks of Treatment: Open N/A N/A Wound Status: No N/A N/A Wound Recurrence: 0x0x0 N/A N/A Measurements L x W x D (cm) 0 N/A N/A A (cm) : rea 0 N/A N/A Volume (cm) : 100.00% N/A N/A % Reduction in Area: 100.00% N/A N/A % Reduction in Volume: Full Thickness Without Exposed N/A N/A Classification: Support Structures None Present N/A N/A Exudate Amount: Distinct, outline attached N/A N/A Wound Margin: None Present (0%) N/A N/A Granulation Amount: None Present (0%) N/A N/A Necrotic Amount: Fascia: No N/A N/A Exposed Structures: Fat Layer (Subcutaneous Tissue): No Tendon: No Muscle: No Joint: No Bone: No Large (67-100%) N/A N/A Epithelialization: Treatment Notes Electronic  Signature(s) Signed: 08/18/2021 4:44:59 PM By: Baltazar Najjar MD Signed: 08/18/2021 6:14:30 PM By: Zandra Abts RN, BSN Entered By: Baltazar Najjar on 08/18/2021 14:01:36 -------------------------------------------------------------------------------- Multi-Disciplinary Care Plan Details Patient Name: Date of Service: [name], [name] 08/18/2021 1:30 PM Medical Record Number: [id number] Patient Account Number: [id number] Date of Birth/Sex: Treating RN: 10-08-1935 (86 y.o. [name] Primary Care Deland Slocumb: [name] Other Clinician: Referring Keshun Berrett: Treating Yachet Mattson/Extender: Anabel Bene in Treatment: 5 Multidisciplinary Care Plan reviewed with physician Active Inactive Electronic Signature(s) Signed: 08/18/2021 6:14:30 PM By: Zandra Abts RN, BSN Entered By: Zandra Abts on 08/18/2021 16:10:12 -------------------------------------------------------------------------------- Pain Assessment Details Patient Name: Date of Service: [name], [name] 08/18/2021 1:30 PM Medical Record Number: [id number] Patient Account Number: [id number] Date of Birth/Sex: Treating RN: 06/12/1936 (86 y.o. [name] Primary Care Donn Wilmot: [name] Other Clinician: Referring Nuriyah Hanline: Treating Bhakti Labella/Extender: Anabel Bene in Treatment: 5 Active Problems Location of Pain Severity and Description of Pain Patient Has Paino No Site Locations Pain Management and Medication Current Pain Management: Electronic Signature(s) Signed: 08/18/2021 6:14:30 PM By: Zandra Abts RN, BSN Entered By: Zandra Abts on 08/18/2021 13:41:16 -------------------------------------------------------------------------------- Patient/Caregiver Education Details Patient Name: Date of Service: [name], Chestine Spore 2/21/2023andnbsp1:30 PM Medical Record Number: [id number] Patient Account Number: [id number] Date of  Birth/Gender: Treating RN: [birth year] (86 y.o. [name] Primary Care Physician: [name] Other Clinician: Referring Physician: Treating Physician/Extender: Anabel Bene in Treatment: 5 Education Assessment Education Provided To: Patient Education Topics Provided Wound/Skin Impairment: Methods: Explain/Verbal Responses: State content correctly Electronic Signature(s) Signed: 08/18/2021 6:14:30 PM By: Zandra Abts RN, BSN Entered By: Zandra Abts on 08/18/2021 13:51:13 -------------------------------------------------------------------------------- Wound Assessment Details Patient Name: Date of Service: [name], [name] 08/18/2021 1:30 PM Medical Record Number: [id number] Patient Account Number: [id number] Date of Birth/Sex: Treating RN: 1935/07/23 (86 y.o. [name] Primary Care Rodolphe Edmonston: [name] Other Clinician: Referring Gabryelle Whitmoyer: Treating Rollie Hynek/Extender: Wilmer Floor Weeks in Treatment: 5 Wound Status Wound Number: 3 Primary Venous Leg Ulcer Etiology: Wound Location: Right, Posterior Lower Leg Wound Open Wounding Event: Blister Status: Date Acquired: 03/28/2021 Comorbid Coronary Artery Disease, Hypertension, Myocardial Infarction, Weeks Of Treatment: 5 History: Peripheral Venous Disease, Type II Diabetes Clustered Wound: No Photos Wound Measurements Length: (cm) Width: (cm) Depth: (cm) Area: (cm) Volume: (cm) 0 % Reduction in Area: 100% 0 % Reduction in Volume: 100% 0 Epithelialization: Large (67-100%) 0 Tunneling:  No 0 Undermining: No Wound Description Classification: Full Thickness Without Exposed Support Structures Wound Margin: Distinct, outline attached Exudate Amount: None Present Foul Odor After Cleansing: No Slough/Fibrino No Wound Bed Granulation Amount: None Present (0%) Exposed Structure Necrotic Amount: None Present (0%) Fascia Exposed: No Fat Layer  (Subcutaneous Tissue) Exposed: No Tendon Exposed: No Muscle Exposed: No Joint Exposed: No Bone Exposed: No Electronic Signature(s) Signed: 08/18/2021 6:14:30 PM By: Zandra Abts RN, BSN Entered By: Zandra Abts on 08/18/2021 13:46:00 -------------------------------------------------------------------------------- Vitals Details Patient Name: Date of Service: [name], [name] 08/18/2021 1:30 PM Medical Record Number: [id number] Patient Account Number: [id number] Date of Birth/Sex: Treating RN: 18-Nov-1935 (86 y.o. [name] Primary Care Matalie Romberger: [name] Other Clinician: Referring Damonte Frieson: Treating Kerston Landeck/Extender: Anabel Bene in Treatment: 5 Vital Signs Time Taken: 13:39 Temperature (F): 98.4 Height (in): 70 Pulse (bpm): 71 Weight (lbs): 195 Respiratory Rate (breaths/min): 18 Body Mass Index (BMI): 28 Blood Pressure (mmHg): 162/78 Reference Range: 80 - 120 mg / dl Electronic Signature(s) Signed: 08/18/2021 6:14:30 PM By: Zandra Abts RN, BSN Entered By: Zandra Abts on 08/18/2021 13:40:59

## 2021-08-21 ENCOUNTER — Encounter (HOSPITAL_BASED_OUTPATIENT_CLINIC_OR_DEPARTMENT_OTHER): Payer: Medicare Other | Admitting: General Surgery

## 2021-08-21 ENCOUNTER — Other Ambulatory Visit: Payer: Self-pay

## 2021-08-21 DIAGNOSIS — I87331 Chronic venous hypertension (idiopathic) with ulcer and inflammation of right lower extremity: Secondary | ICD-10-CM | POA: Diagnosis not present

## 2021-08-21 NOTE — Progress Notes (Signed)
LAWERNCE, EARLL ([id number]) Visit Report for 08/21/2021 SuperBill Details Patient Name: Date of Service: CO PPA Hildred Alamin 08/21/2021 Medical Record Number: [id number] Patient Account Number: [id number] Date of Birth/Sex: Treating RN: 1935-10-31 (86 y.o. Bayard Hugger, Bonita Quin Primary Care Provider: [name] Other Clinician: Referring Provider: Treating Provider/Extender: Delia Heady Weeks in Treatment: 5 Diagnosis Coding ICD-10 Codes Code Description [phone] Non-pressure chronic ulcer of other part of right lower leg with other specified severity I87.331 Chronic venous hypertension (idiopathic) with ulcer and inflammation of right lower extremity I89.0 Lymphedema, not elsewhere classified E11.622 Type 2 diabetes mellitus with other skin ulcer Facility Procedures CPT4 Code Description Modifier Quantity [id number] - WOUND CARE VISIT-LEV 2 EST PT 1 Electronic Signature(s) Signed: 08/21/2021 11:40:31 AM By: Duanne Guess MD FACS Signed: 08/21/2021 2:25:17 PM By: Zenaida Deed RN, BSN Entered By: Zenaida Deed on 08/21/2021 11:37:04

## 2021-08-21 NOTE — Progress Notes (Signed)
Allen, Buck ([id number]) Visit Report for 08/21/2021 Arrival Information Details Patient Name: Date of Service: CO PPA Dion Saucier. 08/21/2021 11:15 A M Medical Record Number: [id number] Patient Account Number: [id number] Date of Birth/Sex: Treating RN: 1936/06/07 (86 y.o. [name], [name] [name]: Gala Romney Other Clinician: Referring [name]: Treating Juliyah Mergen/Extender: Cephas Darby in Treatment: 5 Visit Information History Since Last Visit Added or deleted any medications: No Patient Arrived: Ambulatory Any new allergies or adverse reactions: No Arrival Time: 11:20 Had a fall or experienced change in No Accompanied By: daughter activities of daily living that may affect Transfer Assistance: None risk of falls: Patient Identification Verified: Yes Signs or symptoms of abuse/neglect since last visito No Secondary Verification Process Completed: Yes Hospitalized since last visit: No Patient Requires Transmission-Based Precautions: No Implantable device outside of the clinic excluding No Patient Has Alerts: No cellular tissue based products placed in the center since last visit: Has Dressing in Place as Prescribed: Yes Has Compression in Place as Prescribed: Yes Pain Present Now: No Electronic Signature(s) Signed: 08/21/2021 2:25:17 PM By: Baruch Gouty RN, BSN Entered By: Baruch Gouty on 08/21/2021 11:34:29 -------------------------------------------------------------------------------- Clinic Level of Care Assessment Details Patient Name: Date of Service: [name], [name] 08/21/2021 11:15 A M Medical Record Number: [id number] Patient Account Number: [id number] Date of Birth/Sex: Treating RN: Nov 21, 1935 (86 y.o. [name] Primary Care [name]: Gala Romney Other Clinician: Referring Laketra Bowdish: Treating [name]/Extender: Cephas Darby in Treatment: 5 Clinic Level of Care  Assessment Items TOOL 4 Quantity Score X- 1 0 Use when only an EandM is performed on FOLLOW-UP visit ASSESSMENTS - Nursing Assessment / Reassessment X- 1 10 Reassessment of Co-morbidities (includes updates in patient status) X- 1 5 Reassessment of Adherence to Treatment Plan ASSESSMENTS - Wound and Skin A ssessment / Reassessment []  - 0 Simple Wound Assessment / Reassessment - one wound []  - 0 Complex Wound Assessment / Reassessment - multiple wounds []  - 0 Dermatologic / Skin Assessment (not related to wound area) ASSESSMENTS - Focused Assessment []  - 0 Circumferential Edema Measurements - multi extremities []  - 0 Nutritional Assessment / Counseling / Intervention []  - 0 Lower Extremity Assessment (monofilament, tuning fork, pulses) []  - 0 Peripheral Arterial Disease Assessment (using hand held doppler) ASSESSMENTS - Ostomy and/or Continence Assessment and Care []  - 0 Incontinence Assessment and Management []  - 0 Ostomy Care Assessment and Management (repouching, etc.) PROCESS - Coordination of Care X - Simple Patient / Family Education for ongoing care 1 15 []  - 0 Complex (extensive) Patient / Family Education for ongoing care X- 1 10 Staff obtains Programmer, systems, Records, T Results / Process Orders est []  - 0 Staff telephones HHA, Nursing Homes / Clarify orders / etc []  - 0 Routine Transfer to another Facility (non-emergent condition) []  - 0 Routine Hospital Admission (non-emergent condition) []  - 0 New Admissions / Biomedical engineer / Ordering NPWT Apligraf, etc. , []  - 0 Emergency Hospital Admission (emergent condition) X- 1 10 Simple Discharge Coordination []  - 0 Complex (extensive) Discharge Coordination PROCESS - Special Needs []  - 0 Pediatric / Minor Patient Management []  - 0 Isolation Patient Management []  - 0 Hearing / Language / Visual special needs []  - 0 Assessment of Community assistance (transportation, D/C planning, etc.) []  -  0 Additional assistance / Altered mentation []  - 0 Support Surface(s) Assessment (bed, cushion, seat, etc.) INTERVENTIONS - Wound Cleansing / Measurement []  - 0 Simple Wound Cleansing -  one wound []  - 0 Complex Wound Cleansing - multiple wounds []  - 0 Wound Imaging (photographs - any number of wounds) []  - 0 Wound Tracing (instead of photographs) []  - 0 Simple Wound Measurement - one wound []  - 0 Complex Wound Measurement - multiple wounds INTERVENTIONS - Wound Dressings []  - 0 Small Wound Dressing one or multiple wounds []  - 0 Medium Wound Dressing one or multiple wounds []  - 0 Large Wound Dressing one or multiple wounds []  - 0 Application of Medications - topical []  - 0 Application of Medications - injection INTERVENTIONS - Miscellaneous []  - 0 External ear exam []  - 0 Specimen Collection (cultures, biopsies, blood, body fluids, etc.) []  - 0 Specimen(s) / Culture(s) sent or taken to Lab for analysis []  - 0 Patient Transfer (multiple staff / Civil Service fast streamer / Similar devices) []  - 0 Simple Staple / Suture removal (25 or less) []  - 0 Complex Staple / Suture removal (26 or more) []  - 0 Hypo / Hyperglycemic Management (close monitor of Blood Glucose) []  - 0 Ankle / Brachial Index (ABI) - do not check if billed separately []  - 0 Vital Signs Has the patient been seen at the hospital within the last three years: Yes Total Score: 50 Level Of Care: New/Established - Level 2 Electronic Signature(s) Signed: 08/21/2021 2:25:17 PM By: Baruch Gouty RN, BSN Entered By: Baruch Gouty on 08/21/2021 11:36:58 -------------------------------------------------------------------------------- Encounter Discharge Information Details Patient Name: Date of Service: [name], [name] 08/21/2021 11:15 A M Medical Record Number: [id number] Patient Account Number: [id number] Date of Birth/Sex: Treating RN: December 13, 1935 (86 y.o. [name] [name]: Gala Romney  Other Clinician: Referring Holy Battenfield: Treating Jemaine Prokop/Extender: Cephas Darby in Treatment: 5 Encounter Discharge Information Items Discharge Condition: Stable Ambulatory Status: Ambulatory Discharge Destination: Home Transportation: Private Auto Accompanied By: daughter Schedule Follow-up Appointment: No Clinical Summary of Care: Patient Declined Notes Wound remains healed. Patient and daughter educated on how to apply Juxtalite to bilateral lower legs, daughter able to repeat back instructions correctly. Electronic Signature(s) Signed: 08/21/2021 2:25:17 PM By: Baruch Gouty RN, BSN Entered By: Baruch Gouty on 08/21/2021 11:36:26 -------------------------------------------------------------------------------- Patient/Caregiver Education Details Patient Name: Date of Service: [name], [place] HN L. 2/24/2023andnbsp11:15 A M Medical Record Number: [id number] Patient Account Number: [id number] Date of Birth/Gender: Treating RN: [birth year] (86 y.o. [name] Primary Care Physician: Gala Romney Other Clinician: Referring Physician: Treating Physician/Extender: Cephas Darby in Treatment: 5 Education Assessment Education Provided To: Patient and Caregiver daughter Education Topics Provided Venous: Methods: Explain/Verbal Responses: State content correctly Notes Patient and daughter educated on how to apply Juxtalite to bilateral lower legs, daughter able to repeat back instructions correctly. Electronic Signature(s) Signed: 08/21/2021 2:25:17 PM By: Baruch Gouty RN, BSN Entered By: Baruch Gouty on 08/21/2021 11:35:57

## 2021-08-26 ENCOUNTER — Other Ambulatory Visit: Payer: Self-pay

## 2021-08-26 DIAGNOSIS — I872 Venous insufficiency (chronic) (peripheral): Secondary | ICD-10-CM

## 2021-09-07 NOTE — Progress Notes (Unsigned)
[facility]  ASSESSMENT / PLAN: 86 y.o. male with *** - ***  CHIEF COMPLAINT: ***  HISTORY OF PRESENT ILLNESS: [name] is a 86 y.o. male ***  VASCULAR SURGICAL HISTORY: ***  VASCULAR RISK FACTORS: {FINDINGS; POSITIVE NEGATIVE:205-226-0406} history of stroke / transient ischemic attack. {FINDINGS; POSITIVE NEGATIVE:205-226-0406} history of coronary artery disease. *** history of PCI. *** history of CABG.  {FINDINGS; POSITIVE NEGATIVE:205-226-0406} history of diabetes mellitus. Last A1c ***. {FINDINGS; POSITIVE NEGATIVE:205-226-0406} history of smoking. *** actively smoking. {FINDINGS; POSITIVE NEGATIVE:205-226-0406} history of hypertension. *** drug regimen with *** control. {FINDINGS; POSITIVE NEGATIVE:205-226-0406} history of chronic kidney disease.  Last GFR ***. CKD {stage:30421363}. {FINDINGS; POSITIVE NEGATIVE:205-226-0406} history of chronic obstructive pulmonary disease, treated with ***.  FUNCTIONAL STATUS: ECOG performance status: {findings; ecog performance status:31780} Ambulatory status: {TNHAmbulation:25868}  Past Medical History:  Diagnosis Date   Acute MI, subendocardial ([place])    Coronary artery disease 2008   DES placed to left cfx.     HCD (hypertensive cardiovascular disease)    Hyperlipidemia     Past Surgical History:  Procedure Laterality Date   CARDIAC CATHETERIZATION  01/02/2007   EF 50%   CHOLECYSTECTOMY N/A 03/04/2019   Procedure: LAPAROSCOPIC CHOLECYSTECTOMY;  Surgeon: Ralene Ok, MD;  Location: [facility] OR;  Service: General;  Laterality: N/A;   CORONARY STENT PLACEMENT     LEFT CIRCUMFLEX    Family History  Problem Relation Age of Onset   Heart disease Mother    Healthy Father        NEG HX    Social History   Socioeconomic History   Marital status: Married    Spouse name: Not on file   Number of children: Not on file   Years of education: Not on file   Highest education level: Not on file  Occupational  History   Not on file  Tobacco Use   Smoking status: Former    Types: Cigarettes    Quit date: 02/02/1985    Years since quitting: 36.6   Smokeless tobacco: Never  Vaping Use   Vaping Use: Not on file  Substance and Sexual Activity   Alcohol use: No   Drug use: No   Sexual activity: Not on file  Other Topics Concern   Not on file  Social History Narrative   Not on file   Social Determinants of Health   Financial Resource Strain: Not on file  Food Insecurity: Not on file  Transportation Needs: Not on file  Physical Activity: Not on file  Stress: Not on file  Social Connections: Not on file  Intimate Partner Violence: Not on file    No Known Allergies  Current Outpatient Medications  Medication Sig Dispense Refill   acetaminophen (TYLENOL) 500 MG tablet Take 1,000 mg by mouth every 6 (six) hours as needed for mild pain, moderate pain, fever or headache.     bacitracin ointment Apply 1 application topically 2 (two) times daily. 120 g 0   clopidogrel (PLAVIX) 75 MG tablet Take 1 tablet (75 mg total) by mouth daily. 30 tablet 9   doxycycline (VIBRAMYCIN) 100 MG capsule Take 1 capsule (100 mg total) by mouth 2 (two) times daily. 20 capsule 0   ezetimibe (ZETIA) 10 MG tablet Take 1 tablet (10 mg total) by mouth daily. 30 tablet 11   metFORMIN (GLUCOPHAGE) 500 MG tablet Take 1 tablet (500 mg total) by mouth daily with breakfast. (Patient taking differently: Take 500 mg by mouth 2 (two) times  daily with a meal. ) 30 tablet 1   nitroGLYCERIN (NITROSTAT) 0.4 MG SL tablet Place 0.4 mg under the tongue every 5 (five) minutes as needed for chest pain.     rosuvastatin (CRESTOR) 40 MG tablet Take 1 tablet (40 mg total) by mouth daily. 30 tablet 11   No current facility-administered medications for this visit.    PHYSICAL EXAM There were no vitals filed for this visit.  Constitutional: *** appearing. *** distress. Appears *** nourished.  Neurologic: CN ***. *** focal findings. ***  sensory loss. Psychiatric: *** Mood and affect symmetric and appropriate. Eyes: *** No icterus. No conjunctival pallor. Ears, nose, throat: *** mucous membranes moist. Midline trachea.  Cardiac: *** rate and rhythm.  Respiratory: *** unlabored. Abdominal: *** soft, non-tender, non-distended.  Peripheral vascular: *** Extremity: *** edema. *** cyanosis. *** pallor.  Skin: *** gangrene. *** ulceration.  Lymphatic: *** Stemmer's sign. *** palpable lymphadenopathy.  PERTINENT LABORATORY AND RADIOLOGIC DATA  Most recent CBC CBC Latest Ref Rng & Units 06/30/2021 03/05/2019 03/04/2019  WBC 4.0 - 10.5 K/uL 6.0 11.1(H) 6.5  Hemoglobin 13.0 - 17.0 g/dL 11.2(L) 12.7(L) 12.1(L)  Hematocrit 39.0 - 52.0 % 36.0(L) 38.7(L) 37.4(L)  Platelets 150 - 400 K/uL 203 244 185     Most recent CMP CMP Latest Ref Rng & Units 06/30/2021 03/05/2019 03/04/2019  Glucose 70 - 99 mg/dL 119(H) 133(H) 108(H)  BUN 8 - 23 mg/dL 15 9 5(L)  Creatinine 0.61 - 1.24 mg/dL 1.15 1.03 0.95  Sodium 135 - 145 mmol/L 136 136 137  Potassium 3.5 - 5.1 mmol/L 3.3(L) 3.5 3.4(L)  Chloride 98 - 111 mmol/L 101 102 105  CO2 22 - 32 mmol/L 28 21(L) 21(L)  Calcium 8.9 - 10.3 mg/dL 9.2 8.6(L) 8.7(L)  Total Protein 6.5 - 8.1 g/dL 7.0 6.8 6.1(L)  Total Bilirubin 0.3 - 1.2 mg/dL 0.9 0.8 1.0  Alkaline Phos 38 - 126 U/L 56 69 68  AST 15 - 41 U/L 13(L) 24 17  ALT 0 - 44 U/L 10 72(H) 93(H)    Renal function CrCl cannot be calculated (Patient's most recent lab result is older than the maximum 21 days allowed.).  Hgb A1c MFr Bld (%)  Date Value  02/27/2019 6.5 (H)    LDL Calculated  Date Value Ref Range Status  11/18/2017 94 0 - 99 mg/dL Final   Direct LDL  Date Value Ref Range Status  11/01/2012 182.5 mg/dL Final    Comment:    Optimal:  <100 mg/dLNear or Above Optimal:  100-129 mg/dLBorderline High:  130-159 mg/dLHigh:  160-189 mg/dLVery High:  >190 mg/dL   LDL Direct  Date Value Ref Range Status  11/18/2017 98 0 - 99 mg/dL Final      Vascular Imaging: ***  Paquita Printy N. Stanford Breed, MD [facility] Phone Number: [phone] 09/07/2021 8:58 PM  Total time spent on preparing this encounter including chart review, data review, collecting history, examining the patient, coordinating care for this {tnhtimebilling:26202}  Portions of this report may have been transcribed using voice recognition software.  Every effort has been made to ensure accuracy; however, inadvertent computerized transcription errors may still be present.

## 2021-09-08 ENCOUNTER — Ambulatory Visit: Payer: Medicare Other | Admitting: Vascular Surgery

## 2021-09-08 ENCOUNTER — Other Ambulatory Visit: Payer: Self-pay

## 2021-09-08 ENCOUNTER — Encounter: Payer: Self-pay | Admitting: Vascular Surgery

## 2021-09-08 ENCOUNTER — Ambulatory Visit (HOSPITAL_COMMUNITY)
Admission: RE | Admit: 2021-09-08 | Discharge: 2021-09-08 | Disposition: A | Discharge status: Home / Self Care | Payer: Medicare Other | Source: Ambulatory Visit | Attending: Vascular Surgery | Admitting: Vascular Surgery

## 2021-09-08 VITALS — BP 171/77 | HR 55 | Temp 98.5°F | Resp 20 | Ht 69.0 in | Wt 216.0 lb

## 2021-09-08 DIAGNOSIS — I739 Peripheral vascular disease, unspecified: Secondary | ICD-10-CM | POA: Diagnosis not present

## 2021-09-08 DIAGNOSIS — I872 Venous insufficiency (chronic) (peripheral): Secondary | ICD-10-CM | POA: Insufficient documentation

## 2021-09-11 ENCOUNTER — Other Ambulatory Visit: Payer: Self-pay | Admitting: *Deleted

## 2021-09-11 DIAGNOSIS — I872 Venous insufficiency (chronic) (peripheral): Secondary | ICD-10-CM

## 2021-09-11 DIAGNOSIS — I739 Peripheral vascular disease, unspecified: Secondary | ICD-10-CM

## 2023-04-29 ENCOUNTER — Ambulatory Visit: Payer: Medicare Other | Admitting: Podiatry

## 2023-04-29 ENCOUNTER — Encounter: Payer: Self-pay | Admitting: Podiatry

## 2023-04-29 DIAGNOSIS — E119 Type 2 diabetes mellitus without complications: Secondary | ICD-10-CM

## 2023-04-29 DIAGNOSIS — M2041 Other hammer toe(s) (acquired), right foot: Secondary | ICD-10-CM

## 2023-04-29 DIAGNOSIS — M2042 Other hammer toe(s) (acquired), left foot: Secondary | ICD-10-CM

## 2023-04-29 NOTE — Progress Notes (Signed)
Subjective: [name] presents today referred by Rometta Emery, MD for diabetic foot evaluation.  Patient relates many year history of diabetes.  Patient denies any history of foot wounds.  Patient denies any history of numbness, tingling, burning, pins/needles sensations.  Past Medical History:  Diagnosis Date   Acute MI, subendocardial ([facility])    Coronary artery disease 2008   DES placed to left cfx.     HCD (hypertensive cardiovascular disease)    Hyperlipidemia     Patient Active Problem List   Diagnosis Date Noted   Pancreatitis 02/27/2019   Cholelithiasis 02/27/2019   Leukocytosis 02/27/2019   Nausea and vomiting 02/27/2019   CAD (coronary artery disease) 02/27/2019   Metabolic encephalopathy 11/30/2017   Acute kidney injury ([facility]) 11/29/2017   Benign hypertensive heart disease without heart failure 04/30/2013   Ischemic heart disease 02/15/2011   HLD (hyperlipidemia) 02/15/2011   Type 2 diabetes mellitus without complication ([facility]) 02/15/2011    Past Surgical History:  Procedure Laterality Date   CARDIAC CATHETERIZATION  01/02/2007   EF 50%   CHOLECYSTECTOMY N/A 03/04/2019   Procedure: LAPAROSCOPIC CHOLECYSTECTOMY;  Surgeon: Axel Filler, MD;  Location: [facility] OR;  Service: General;  Laterality: N/A;   CORONARY STENT PLACEMENT     LEFT CIRCUMFLEX    Current Outpatient Medications on File Prior to Visit  Medication Sig Dispense Refill   acetaminophen (TYLENOL) 500 MG tablet Take 1,000 mg by mouth every 6 (six) hours as needed for mild pain, moderate pain, fever or headache.     bacitracin ointment Apply 1 application topically 2 (two) times daily. 120 g 0   clopidogrel (PLAVIX) 75 MG tablet Take 1 tablet (75 mg total) by mouth daily. 30 tablet 9   doxycycline (VIBRAMYCIN) 100 MG capsule Take 1 capsule (100 mg total) by mouth 2 (two) times daily. 20 capsule 0   ezetimibe (ZETIA) 10 MG tablet Take 1 tablet (10 mg total) by mouth daily. 30 tablet 11   metFORMIN  (GLUCOPHAGE) 500 MG tablet Take 1 tablet (500 mg total) by mouth daily with breakfast. (Patient taking differently: Take 500 mg by mouth 2 (two) times daily with a meal.) 30 tablet 1   nitroGLYCERIN (NITROSTAT) 0.4 MG SL tablet Place 0.4 mg under the tongue every 5 (five) minutes as needed for chest pain.     rosuvastatin (CRESTOR) 40 MG tablet Take 1 tablet (40 mg total) by mouth daily. 30 tablet 11   No current facility-administered medications on file prior to visit.     No Known Allergies  Social History   Occupational History   Not on file  Tobacco Use   Smoking status: Former    Current packs/day: 0.00    Types: Cigarettes    Quit date: 02/02/1985    Years since quitting: 38.2   Smokeless tobacco: Never  Vaping Use   Vaping status: Not on file  Substance and Sexual Activity   Alcohol use: No   Drug use: No   Sexual activity: Not on file    Family History  Problem Relation Age of Onset   Heart disease Mother    Healthy Father        NEG HX    Immunization History  Administered Date(s) Administered   Pneumococcal Polysaccharide-23 03/05/2019    Review of systems: Positive Findings in bold print.  Constitutional:  chills, fatigue, fever, sweats, weight change Communication: Nurse, learning disability, sign Presenter, broadcasting, hand writing, iPad/Android device Head: headaches, head injury Eyes: changes in vision, eye  pain, glaucoma, cataracts, macular degeneration, diplopia, glare,  light sensitivity, eyeglasses or contacts, blindness Ears nose mouth throat: hearing impaired, hearing aids,  ringing in ears, deaf, sign language,  vertigo, nosebleeds,  rhinitis,  cold sores, snoring, swollen glands Cardiovascular: HTN, edema, arrhythmia, pacemaker in place, defibrillator in place, chest pain/tightness, chronic anticoagulation, blood clot, heart failure, MI Peripheral Vascular: leg cramps, varicose veins, blood clots, lymphedema, varicosities Respiratory:  asthma, difficulty breathing,  denies congestion, SOB, wheezing, cough, emphysema Gastrointestinal: change in appetite or weight, abdominal pain, constipation, diarrhea, nausea, vomiting, vomiting blood, change in bowel habits, abdominal pain, jaundice, rectal bleeding, hemorrhoids, GERD Genitourinary:  nocturia,  pain on urination, polyuria,  blood in urine, Foley catheter, urinary urgency, ESRD on hemodialysis Musculoskeletal: amputation, cramping, stiff joints, painful joints, decreased joint motion, fractures, OA, gout, hemiplegia, paraplegia, uses cane, wheelchair bound, uses walker, uses rollator Skin: +changes in toenails, color change, dryness, itching, mole changes,  rash, wound(s) Neurological: headaches, numbness in feet, paresthesias in feet, burning in feet, fainting,  seizures, change in speech, migraines, memory problems/poor historian, cerebral palsy, weakness, paralysis, CVA, TIA Endocrine: diabetes, hypothyroidism, hyperthyroidism,  goiter, dry mouth, flushing, heat intolerance, cold intolerance,  excessive thirst, denies polyuria,  nocturia Hematological:  easy bleeding, excessive bleeding, easy bruising, enlarged lymph nodes, on long term blood thinner, history of past transusions Allergy/immunological:  hives, eczema, frequent infections, multiple drug allergies, seasonal allergies, transplant recipient, multiple food allergies Psychiatric:  anxiety, depression, mood disorder, suicidal ideations, hallucinations, insomnia  Objective: There were no vitals filed for this visit. Vascular Examination: Capillary refill time less than 3 seconds x 10 digits.  Dorsalis pedis pulses palpable 2 out of 4.  Posterior tibial pulses palpable 2 out of 4.  Digital hair not present x 10 digits.  Skin temperature gradient WNL b/l.  Dermatological Examination: Skin with normal turgor, texture and tone b/l  Toenails 1-5 b/l discolored, thick, dystrophic with subungual debris and pain with palpation to nailbeds due to  thickness of nails.  Musculoskeletal: Muscle strength 5/5 to all LE muscle groups.  Neurological: Sensation intact with 10 gram monofilament.  Vibratory sensation intact.  Assessment: NIDDM Encounter for diabetic foot examination Hammertoe bilateral  Plan: Discussed diabetic foot care principles. Literature dispensed on today. Patient to continue soft, supportive shoe gear daily. Patient to report any pedal injuries to medical professional immediately. Follow up one year. Patient/POA to call should there be a concern in the interim. Given that patient has diabetes in setting of hammertoe contracture patient also benefit from diabetic shoes he will be scheduled to see orthotics department for diabetic shoes

## 2023-05-23 ENCOUNTER — Ambulatory Visit: Payer: Medicare Other

## 2023-05-23 DIAGNOSIS — M2041 Other hammer toe(s) (acquired), right foot: Secondary | ICD-10-CM

## 2023-05-23 DIAGNOSIS — E119 Type 2 diabetes mellitus without complications: Secondary | ICD-10-CM

## 2023-05-23 NOTE — Progress Notes (Signed)
Patient presents to the office today for diabetic shoe and insole measuring.  Patient was measured with brannock device to determine size and width for 1 pair of extra depth shoes and foam casted for 3 pair of insoles.   Documentation of medical necessity will be sent to patient's treating diabetic doctor to verify and sign.   Patient's diabetic provider: Vedia Pereyra MD  Shoes and insoles will be ordered at that time and patient will be notified for an appointment for fitting when they arrive.   Shoe size (per patient): 13 Brannock measurement: 12.5 Patient shoe selection- Shoe choice:   [id number] / [id number] Shoe size ordered: 13W Financials Signed  Sheilia Reznick Cped, CFo, CFm

## 2023-06-17 ENCOUNTER — Telehealth: Payer: Self-pay

## 2023-06-17 NOTE — Telephone Encounter (Signed)
Called PT to schedule diabetic shoe pick up. Left vm

## 2023-06-24 ENCOUNTER — Telehealth: Payer: Self-pay

## 2023-06-24 NOTE — Telephone Encounter (Signed)
Called pt to schedule diabetic shoe pick up

## 2023-07-07 ENCOUNTER — Ambulatory Visit (INDEPENDENT_AMBULATORY_CARE_PROVIDER_SITE_OTHER): Payer: Medicare Other

## 2023-07-07 DIAGNOSIS — M2142 Flat foot [pes planus] (acquired), left foot: Secondary | ICD-10-CM

## 2023-07-07 DIAGNOSIS — M2042 Other hammer toe(s) (acquired), left foot: Secondary | ICD-10-CM

## 2023-07-07 DIAGNOSIS — E119 Type 2 diabetes mellitus without complications: Secondary | ICD-10-CM | POA: Diagnosis not present

## 2023-07-07 DIAGNOSIS — M2041 Other hammer toe(s) (acquired), right foot: Secondary | ICD-10-CM | POA: Diagnosis not present

## 2023-07-07 DIAGNOSIS — M2141 Flat foot [pes planus] (acquired), right foot: Secondary | ICD-10-CM | POA: Diagnosis not present

## 2023-07-07 NOTE — Progress Notes (Signed)

## 2023-07-25 DIAGNOSIS — M7989 Other specified soft tissue disorders: Secondary | ICD-10-CM
